# Patient Record
Sex: Male | Born: 1960 | Race: White | Hispanic: No | Marital: Single | Smoking: Never smoker
Health system: Southern US, Community
[De-identification: ages and names within clinical notes are randomized; demographics above are authoritative.]

## PROBLEM LIST (undated history)

## (undated) DIAGNOSIS — D649 Anemia, unspecified: Secondary | ICD-10-CM

## (undated) DIAGNOSIS — K219 Gastro-esophageal reflux disease without esophagitis: Secondary | ICD-10-CM

## (undated) DIAGNOSIS — E7800 Pure hypercholesterolemia, unspecified: Secondary | ICD-10-CM

## (undated) DIAGNOSIS — I4729 Other ventricular tachycardia: Secondary | ICD-10-CM

## (undated) DIAGNOSIS — I10 Essential (primary) hypertension: Secondary | ICD-10-CM

## (undated) DIAGNOSIS — Z860100 Personal history of colon polyps, unspecified: Secondary | ICD-10-CM

## (undated) DIAGNOSIS — I2699 Other pulmonary embolism without acute cor pulmonale: Secondary | ICD-10-CM

## (undated) DIAGNOSIS — K317 Polyp of stomach and duodenum: Secondary | ICD-10-CM

## (undated) HISTORY — DX: Other pulmonary embolism without acute cor pulmonale: I26.99

## (undated) HISTORY — DX: Other ventricular tachycardia: I47.29

## (undated) HISTORY — DX: Polyp of stomach and duodenum: K31.7

## (undated) HISTORY — DX: Anemia, unspecified: D64.9

## (undated) HISTORY — DX: Personal history of colon polyps, unspecified: Z86.0100

---

## 1999-07-04 ENCOUNTER — Emergency Department (HOSPITAL_COMMUNITY): Admission: EM | Admit: 1999-07-04 | Discharge: 1999-07-04 | Payer: Self-pay | Admitting: Emergency Medicine

## 1999-07-04 ENCOUNTER — Encounter: Payer: Self-pay | Admitting: Emergency Medicine

## 2017-08-18 ENCOUNTER — Encounter (HOSPITAL_COMMUNITY): Payer: Self-pay | Admitting: Emergency Medicine

## 2017-08-18 ENCOUNTER — Other Ambulatory Visit: Payer: Self-pay

## 2017-08-18 ENCOUNTER — Encounter (HOSPITAL_COMMUNITY)
Admission: EM | Disposition: A | Discharge status: Home / Self Care | Payer: Self-pay | Source: Home / Self Care | Attending: Emergency Medicine

## 2017-08-18 ENCOUNTER — Telehealth: Payer: Self-pay

## 2017-08-18 ENCOUNTER — Emergency Department (HOSPITAL_COMMUNITY)
Admission: EM | Admit: 2017-08-18 | Discharge: 2017-08-18 | Disposition: A | Discharge status: Home / Self Care | Payer: Self-pay | Attending: Emergency Medicine | Admitting: Emergency Medicine

## 2017-08-18 DIAGNOSIS — Y999 Unspecified external cause status: Secondary | ICD-10-CM | POA: Insufficient documentation

## 2017-08-18 DIAGNOSIS — R1310 Dysphagia, unspecified: Secondary | ICD-10-CM

## 2017-08-18 DIAGNOSIS — K209 Esophagitis, unspecified: Secondary | ICD-10-CM

## 2017-08-18 DIAGNOSIS — Z79899 Other long term (current) drug therapy: Secondary | ICD-10-CM | POA: Insufficient documentation

## 2017-08-18 DIAGNOSIS — K222 Esophageal obstruction: Secondary | ICD-10-CM | POA: Insufficient documentation

## 2017-08-18 DIAGNOSIS — K2970 Gastritis, unspecified, without bleeding: Secondary | ICD-10-CM

## 2017-08-18 DIAGNOSIS — X58XXXA Exposure to other specified factors, initial encounter: Secondary | ICD-10-CM | POA: Insufficient documentation

## 2017-08-18 DIAGNOSIS — Y939 Activity, unspecified: Secondary | ICD-10-CM | POA: Insufficient documentation

## 2017-08-18 DIAGNOSIS — T18128A Food in esophagus causing other injury, initial encounter: Secondary | ICD-10-CM | POA: Insufficient documentation

## 2017-08-18 DIAGNOSIS — K449 Diaphragmatic hernia without obstruction or gangrene: Secondary | ICD-10-CM

## 2017-08-18 DIAGNOSIS — K317 Polyp of stomach and duodenum: Secondary | ICD-10-CM

## 2017-08-18 DIAGNOSIS — I10 Essential (primary) hypertension: Secondary | ICD-10-CM | POA: Insufficient documentation

## 2017-08-18 DIAGNOSIS — Y929 Unspecified place or not applicable: Secondary | ICD-10-CM | POA: Insufficient documentation

## 2017-08-18 HISTORY — DX: Essential (primary) hypertension: I10

## 2017-08-18 HISTORY — DX: Pure hypercholesterolemia, unspecified: E78.00

## 2017-08-18 HISTORY — PX: ESOPHAGOGASTRODUODENOSCOPY: SHX5428

## 2017-08-18 HISTORY — DX: Gastro-esophageal reflux disease without esophagitis: K21.9

## 2017-08-18 SURGERY — EGD (ESOPHAGOGASTRODUODENOSCOPY)
Anesthesia: Moderate Sedation

## 2017-08-18 MED ORDER — STERILE WATER FOR INJECTION IJ SOLN
INTRAMUSCULAR | Status: AC
  Administered 2017-08-18: 0.9 mL
  Filled 2017-08-18: qty 10

## 2017-08-18 MED ORDER — BUTAMBEN-TETRACAINE-BENZOCAINE 2-2-14 % EX AERO
INHALATION_SPRAY | CUTANEOUS | Status: DC | PRN
  Administered 2017-08-18: 1 via TOPICAL

## 2017-08-18 MED ORDER — GLUCAGON HCL RDNA (DIAGNOSTIC) 1 MG IJ SOLR
1.0000 mg | Freq: Once | INTRAMUSCULAR | Status: AC
  Administered 2017-08-18: 1 mg via INTRAVENOUS
  Filled 2017-08-18: qty 1

## 2017-08-18 MED ORDER — DIPHENHYDRAMINE HCL 50 MG/ML IJ SOLN
INTRAMUSCULAR | Status: AC
  Filled 2017-08-18: qty 1

## 2017-08-18 MED ORDER — FENTANYL CITRATE (PF) 100 MCG/2ML IJ SOLN
INTRAMUSCULAR | Status: DC | PRN
  Administered 2017-08-18 (×3): 25 ug via INTRAVENOUS

## 2017-08-18 MED ORDER — LORAZEPAM 2 MG/ML IJ SOLN
1.0000 mg | Freq: Once | INTRAMUSCULAR | Status: AC
  Administered 2017-08-18: 1 mg via INTRAVENOUS
  Filled 2017-08-18: qty 1

## 2017-08-18 MED ORDER — MIDAZOLAM HCL 10 MG/2ML IJ SOLN
INTRAMUSCULAR | Status: DC | PRN
  Administered 2017-08-18: 1 mg via INTRAVENOUS
  Administered 2017-08-18 (×2): 2 mg via INTRAVENOUS

## 2017-08-18 MED ORDER — FENTANYL CITRATE (PF) 100 MCG/2ML IJ SOLN
INTRAMUSCULAR | Status: AC
  Filled 2017-08-18: qty 2

## 2017-08-18 MED ORDER — OMEPRAZOLE 20 MG PO CPDR: 40.0000 mg | DELAYED_RELEASE_CAPSULE | Freq: Every day | ORAL | 0 refills | Status: DC

## 2017-08-18 MED ORDER — METOCLOPRAMIDE HCL 5 MG/ML IJ SOLN
10.0000 mg | Freq: Once | INTRAMUSCULAR | Status: AC
  Administered 2017-08-18: 10 mg via INTRAVENOUS
  Filled 2017-08-18: qty 2

## 2017-08-18 MED ORDER — MIDAZOLAM HCL 5 MG/ML IJ SOLN
INTRAMUSCULAR | Status: AC
  Filled 2017-08-18: qty 2

## 2017-08-18 MED ORDER — PANTOPRAZOLE SODIUM 40 MG IV SOLR
40.0000 mg | Freq: Once | INTRAVENOUS | Status: AC
  Administered 2017-08-18: 40 mg via INTRAVENOUS
  Filled 2017-08-18: qty 40

## 2017-08-18 NOTE — Telephone Encounter (Signed)
Pyrtle, Lajuan Lines, MD  Willia Craze, NP; Greggory Keen, LPN        [name]  the patient is Jeffrey Potter MRN: [id number]  He would likely best be done at [facility] on 1 of my half days for dilation, but also he had several gastric polyps that need to be removed and clipped.  [facility] would be for screening  Likely best to see Nevin Bloodgood in followup and then arrange procedures at [facility]  May want to grab the hospital slots for him now as they fill quickly  Thanks  JMP   Previous Messages    ----- Message -----  From: Willia Craze, NP  Sent: 08/18/2017  9:53 AM  To: Jerene Bears, MD, Greggory Keen, LPN   [name] please get patient a follow up with me in a couple of weeks. He had food impaction and pyrtle did EGD with disimpaction in the ED today. He needs follow up to discuss repeat EGD and also CRC screening. Thanks

## 2017-08-18 NOTE — ED Triage Notes (Signed)
Pt states he has really bad acid reflux and tonight when he went to eat supper around 9pm he was unable to swallow his food and started having vomiting  Pt denies getting choked on any food  Pt states he is not able to swallow his own saliva

## 2017-08-18 NOTE — ED Provider Notes (Signed)
[facility] DEPT Provider Note: Georgena Spurling, MD, FACEP  CSN: [id number] MRN: [id number] ARRIVAL: 08/18/17 at [place]: WA10/WA10   CHIEF COMPLAINT  Dysphagia   HISTORY OF PRESENT ILLNESS  08/18/17 3:32 AM Jeffrey Potter is a 57 y.o. male with a history of GERD currently on omeprazole.  He has a history of esophageal blockages but has never had to have these medically evaluated as they usually pass on their own and their frequency has improved since being on omeprazole.  He ate lunch yesterday without difficulty.  When he went to eat dinner yesterday evening about 9 PM he found he was unable to swallow anything.  Anything he attempts to swallow comes back up including his own saliva.  There is no associated pain.  He has tried drinking fluids without relief.  He is not aware of being choked on a food bolus.  He is not having difficulty breathing.  He has never seen a gastroenterologist.   Past Medical History:  Diagnosis Date  . GERD (gastroesophageal reflux disease)   . High cholesterol   . Hypertension     History reviewed. No pertinent surgical history.  Family History  Problem Relation Age of Onset  . Cancer Father     Social History   Tobacco Use  . Smoking status: Never Smoker  . Smokeless tobacco: Never Used  Substance Use Topics  . Alcohol use: Yes  . Drug use: No    Prior to Admission medications   Medication Sig Start Date End Date Taking? Authorizing Provider  amLODipine (NORVASC) 5 MG tablet Take 5 mg by mouth daily.   Yes [provider]  losartan-hydrochlorothiazide (HYZAAR) 100-12.5 MG tablet Take 1 tablet by mouth daily.   Yes [provider]  lovastatin (MEVACOR) 20 MG tablet Take 40 mg by mouth daily.   Yes [provider]  omeprazole (PRILOSEC OTC) 20 MG tablet Take 20 mg by mouth daily.   Yes [provider]    Allergies Patient has no known allergies.   REVIEW OF SYSTEMS  Negative except as noted here  or in the History of Present Illness.   PHYSICAL EXAMINATION  Initial Vital Signs Blood pressure (!) 179/110, pulse 97, temperature 98.6 F (37 C), temperature source Oral, resp. rate 18, height 5\' 10"  (1.778 m), weight 99.8 kg (220 lb), SpO2 98 %.  Examination General: Well-developed, well-nourished male in no acute distress; appearance consistent with age of record HENT: normocephalic; atraumatic Eyes: pupils equal, round and reactive to light; extraocular muscles intact Neck: supple Heart: regular rate and rhythm Lungs: clear to auscultation bilaterally Abdomen: soft; nondistended; nontender; bowel sounds present Extremities: No deformity; full range of motion; pulses normal Neurologic: Awake, alert and oriented; motor function intact in all extremities and symmetric; no facial droop Skin: Warm and dry Psychiatric: Normal mood and affect   RESULTS  Summary of this visit's results, reviewed by myself:   EKG Interpretation  Date/Time:    Ventricular Rate:    PR Interval:    QRS Duration:   QT Interval:    QTC Calculation:   R Axis:     Text Interpretation:        Laboratory Studies: No results found for this or any previous visit (from the past 24 hour(s)). Imaging Studies: No results found.  ED COURSE  Nursing notes and initial vitals signs, including pulse oximetry, reviewed.  Vitals:   08/18/17 0318 08/18/17 0323 08/18/17 0410 08/18/17 0530  BP: (!) 179/110  Marland Kitchen)  158/93 133/90  Pulse: 97  92 94  Resp: 18  18 18   Temp: 98.6 F (37 C)     TempSrc: Oral     SpO2: 98%  94% 97%  Weight:  99.8 kg (220 lb)    Height:  5\' 10"  (1.778 m)     5:26 AM No change with IV Protonix, glucagon, Reglan and Ativan.  We will repeat glucagon.  5:47 AM No change with second dose of IV glucagon.  5:54 AM [facility] consulted for urgent endoscopy.  They will see him after 7 AM.  PROCEDURES    ED DIAGNOSES     ICD-10-CM   1. Acute esophageal obstruction  K22.2        Shelaine Frie, Jenny Reichmann, MD 08/18/17 [id number]

## 2017-08-18 NOTE — ED Notes (Signed)
Patient given oral fluids at this time. Family at bedside, no acute distress noted. Patient positioned upright in bed, able to maintain fluids. VSS. Call bell within reach, pt conscious and alert/oriented at baseline. Will continue to monitor.

## 2017-08-18 NOTE — Consult Note (Signed)
Referring Provider: ED Primary Care Physician:  Leanna Battles, MD Primary Gastroenterologist:  None unassigned.  Reason for Consultation:    Food impaction    ASSESSMENT AND PLAN:    57 year old male with long-standing GERD, symptoms controlled on PPI over the last few years.  He still has intermittent solid food dysphasia however in EGD now.  Food impaction.  Meat became lodged in his esophagus at dinner last night.  He has been unable to tolerate p.o. since without vomiting.  Normally he is able to vomit up food bolus but has not been able to this time.  -patient needs EGD with possible removal of food bolus. He understands that we would likely not be able to dilate his esophagus with this procedure and that repeat EGD may be necessary at some point in near future.    HPI: Jeffrey Potter is a 57 y.o. male with hx of HTN and GERD. He is in the emergency department with a food impaction.  Patient was eating pot roast last night when a piece of food became lodged in his esophagus.  He was unable to get the meat and has been vomiting.  Emergency department he is unable to tolerate a sip of fluid.  No shortness of breath coughing or chest pain.  No odynophagia.  Patient gives a long-standing history of GERD but since starting a PPI several years ago his symptoms have been nicely controlled.  He still gets occasional intermittent dysphagia but normally is able to vomit the food.  Patient has never had an upper endoscopy.  He is never had to seek treatment for food impaction.  No other GI complaints.     Past Medical History:  Diagnosis Date  . GERD (gastroesophageal reflux disease)   . High cholesterol   . Hypertension     History reviewed. No pertinent surgical history.  Prior to Admission medications   Medication Sig Start Date End Date Taking? Authorizing Provider  amLODipine (NORVASC) 5 MG tablet Take 5 mg by mouth daily.   Yes [provider]  losartan-hydrochlorothiazide  (HYZAAR) 100-12.5 MG tablet Take 1 tablet by mouth daily.   Yes [provider]  lovastatin (MEVACOR) 20 MG tablet Take 40 mg by mouth daily.   Yes [provider]  omeprazole (PRILOSEC OTC) 20 MG tablet Take 20 mg by mouth daily.   Yes [provider]    No current facility-administered medications for this encounter.    Current Outpatient Medications  Medication Sig Dispense Refill  . amLODipine (NORVASC) 5 MG tablet Take 5 mg by mouth daily.    Marland Kitchen losartan-hydrochlorothiazide (HYZAAR) 100-12.5 MG tablet Take 1 tablet by mouth daily.    Marland Kitchen lovastatin (MEVACOR) 20 MG tablet Take 40 mg by mouth daily.    Marland Kitchen omeprazole (PRILOSEC OTC) 20 MG tablet Take 20 mg by mouth daily.      Allergies as of 08/18/2017  . (No Known Allergies)    Family History  Problem Relation Age of Onset  . Cancer Father     Social History   Socioeconomic History  . Marital status: Single    Spouse name: Not on file  . Number of children: Not on file  . Years of education: Not on file  . Highest education level: Not on file  Social Needs  . Financial resource strain: Not on file  . Food insecurity - worry: Not on file  . Food insecurity - inability: Not on file  . Transportation needs -  medical: Not on file  . Transportation needs - non-medical: Not on file  Occupational History  . Not on file  Tobacco Use  . Smoking status: Never Smoker  . Smokeless tobacco: Never Used  Substance and Sexual Activity  . Alcohol use: Yes  . Drug use: No  . Sexual activity: Not on file  Other Topics Concern  . Not on file  Social History Narrative  . Not on file    Review of Systems: All systems reviewed and negative except where noted in HPI.  Physical Exam: Vital signs in last 24 hours: Temp:  [98.6 F (37 C)] 98.6 F (37 C) (03/08 0318) Pulse Rate:  [78-97] 82 (03/08 0912) Resp:  [16-20] 20 (03/08 0912) BP: (133-179)/(89-110) 149/94 (03/08 0912) SpO2:  [93 %-98 %] 96 %  (03/08 0912) Weight:  [220 lb (99.8 kg)] 220 lb (99.8 kg) (03/08 0323)   General:   Alert, well-developed,  White male in NAD Psych:  Pleasant, cooperative. Normal mood and affect. Eyes:  Pupils equal, sclera clear, no icterus.   Conjunctiva pink. Ears:  Normal auditory acuity. Nose:  No deformity, discharge,  or lesions. Neck:  Supple; no masses Lungs:  Clear throughout to auscultation.   No wheezes, crackles, or rhonchi.  Heart:  Regular rate and rhythm; no murmurs, no edema Abdomen:  Soft, non-distended, nontender, BS active, no palp mass    Rectal:  Deferred  Msk:  Symmetrical without gross deformities. . Pulses:  Normal pulses noted. Neurologic:  Alert and  oriented x4;  grossly normal neurologically. Skin:  Intact without significant lesions or rashes..   Intake/Output from previous day: No intake/output data recorded. Intake/Output this shift: No intake/output data recorded.  Lab Results: No results for input(s): WBC, HGB, HCT, PLT in the last 72 hours. BMET No results for input(s): NA, K, CL, CO2, GLUCOSE, BUN, CREATININE, CALCIUM in the last 72 hours. LFT No results for input(s): PROT, ALBUMIN, AST, ALT, ALKPHOS, BILITOT, BILIDIR, IBILI in the last 72 hours. PT/INR No results for input(s): LABPROT, INR in the last 72 hours. Hepatitis Panel No results for input(s): HEPBSAG, HCVAB, HEPAIGM, HEPBIGM in the last 72 hours.    Studies/Results: No results found.   Jeffrey Savoy, NP-C @  08/18/2017, 9:15 AM  Pager number [phone]

## 2017-08-18 NOTE — Telephone Encounter (Addendum)
-----   Message from Jerene Bears, MD sent at 08/18/2017 10:20 AM EST ----- Yes you can split the EGD and colon EGD at [place] can be thereafter at [place]  ----- Message ----- From: Greggory Keen, LPN Sent: 0/0/4599  77:41 AM To: Jerene Bears, MD  How urgent is the EGD? May I split the procedures? At this moment there is one opening for a single case on 10/10/17.

## 2017-08-18 NOTE — ED Notes (Signed)
Bed: WA10 Expected date:  Expected time:  Means of arrival:  Comments: 

## 2017-08-18 NOTE — ED Notes (Signed)
GI at bedside completing procedure.

## 2017-08-18 NOTE — Telephone Encounter (Signed)
Follow up with Jeffrey Potter  At 3:00 pm on 09/01/17. EGD with biopsy and dilation at [place] on 10/10/17 at 10:30 am. He will need instructions at his follow up visit on 09/01/17. Patient had an emergency EGD today. I will send this in a letter. It may have also been printed on his AVS when he was discharge today if

## 2017-08-18 NOTE — Discharge Instructions (Signed)

## 2017-08-18 NOTE — Progress Notes (Signed)
EGD procedure completed with no concerns. Report given at bedside to [facility], [place].

## 2017-08-18 NOTE — Op Note (Signed)
[facility] Patient Name: Jeffrey Potter Procedure Date: 08/18/2017 MRN: [id number] Attending MD: Jerene Bears , MD Date of Birth: [birth year] CSN: [id number] Age: 57 Admit Type: Emergency Department Procedure:                Upper GI endoscopy Indications:              Foreign body in the esophagus Providers:                Lajuan Lines. Hilarie Fredrickson, MD, Carolynn Comment RN, RN, Charolette Child, Technician Referring MD:             Shanon Rosser, MD (ER doctor) Medicines:                Fentanyl 75 micrograms IV, Midazolam 5 mg IV Complications:            No immediate complications. Estimated Blood Loss:     Estimated blood loss: none. Procedure:                Pre-Anesthesia Assessment:                           - Prior to the procedure, a History and Physical                            was performed, and patient medications and                            allergies were reviewed. The patient's tolerance of                            previous anesthesia was also reviewed. The risks                            and benefits of the procedure and the sedation                            options and risks were discussed with the patient.                            All questions were answered, and informed consent                            was obtained. Prior Anticoagulants: The patient has                            taken no previous anticoagulant or antiplatelet                            agents. ASA Grade Assessment: II - A patient with                            mild systemic disease. After reviewing the risks  and benefits, the patient was deemed in                            satisfactory condition to undergo the procedure.                           After obtaining informed consent, the endoscope was                            passed under direct vision. Throughout the                            procedure, the patient's blood pressure,  pulse, and                            oxygen saturations were monitored continuously. The                            was introduced through the mouth, and advanced to                            the second part of duodenum. The upper GI endoscopy                            was accomplished without difficulty. The patient                            tolerated the procedure well. Findings:      Food was found in the lower third of the esophagus. With insufflation       alone the bolus moved spontaneously into the proximal stomach.      Mildly severe esophagitis with no bleeding was found in the lower third       of the esophagus. This is reflux related and secondary to recent       esophageal food impaction.      A benign-appearing, intrinsic mild stenosis (Schatzki's ring) was found       at the gastroesophageal junction. This was traversed.      A 4 cm hiatal hernia was present.      Two 8-10 mm sessile polyps with no bleeding were found in the gastric       body. These appear to be inflammatory in nature. Polypectomy was not       attempted given food impaction.      Mild inflammation characterized by erythema and nodular/polypoid mucosa       in the was found in the gastric antrum.      The examined duodenum was normal. Impression:               - Food was found in the esophagus. Food impaction                            cleared.                           - Mild esophagitis.                           -  Schatzki's ring was found at the gastroesophageal                            junction.                           - 4 cm hiatal hernia.                           - Two gastric body polyps. Resection not attempted.                           - Gastritis with nodular antral mucosa.                           - Normal examined duodenum.                           - No specimens collected. Moderate Sedation:      Moderate (conscious) sedation was administered by the endoscopy nurse       and  supervised by the endoscopist. The following parameters were       monitored: oxygen saturation, heart rate, blood pressure, and response       to care. Total physician intraservice time was 13 minutes. Recommendation:           - Patient has a contact number available for                            emergencies. The signs and symptoms of potential                            delayed complications were discussed with the                            patient. Return to normal activities tomorrow.                            Written discharge instructions were provided to the                            patient.                           - Soft diet until follow-up EGD with dilation.                           - Continue present medications but increase                            omeprazole to 40 mg once daily.                           - Repeat upper endoscopy at appointment to be                            scheduled for dilation and polypectomy.                           -  Return to GI clinic at appointment to be                            scheduled. Procedure Code(s):        --- Professional ---                           [phone], Esophagogastroduodenoscopy, flexible,                            transoral; diagnostic, including collection of                            specimen(s) by brushing or washing, when performed                            (separate procedure)                           99152, Moderate sedation services provided by the                            same physician or other qualified health care                            professional performing the diagnostic or                            therapeutic service that the sedation supports,                            requiring the presence of an independent trained                            observer to assist in the monitoring of the                            patient's level of consciousness and physiological                             status; initial 15 minutes of intraservice time,                            patient age 81 years or older Diagnosis Code(s):        --- Professional ---                           [phone], Food in esophagus causing other injury,                            initial encounter                           K20.9, Esophagitis, unspecified  K22.2, Esophageal obstruction                           K44.9, Diaphragmatic hernia without obstruction or                            gangrene                           K31.7, Polyp of stomach and duodenum                           K29.70, Gastritis, unspecified, without bleeding                           T18.108A, Unspecified foreign body in esophagus                            causing other injury, initial encounter CPT copyright 2016 [employer]. All rights reserved. The codes documented in this report are preliminary and upon coder review may  be revised to meet current compliance requirements. Jerene Bears, MD 08/18/2017 10:34:45 AM This report has been signed electronically. Number of Addenda: 0

## 2017-08-21 ENCOUNTER — Encounter (HOSPITAL_COMMUNITY): Payer: Self-pay | Admitting: Internal Medicine

## 2017-09-01 ENCOUNTER — Ambulatory Visit: Payer: Self-pay | Admitting: Nurse Practitioner

## 2017-09-06 ENCOUNTER — Ambulatory Visit: Payer: Self-pay | Admitting: Nurse Practitioner

## 2017-09-17 ENCOUNTER — Ambulatory Visit (HOSPITAL_COMMUNITY)
Admission: EM | Admit: 2017-09-17 | Discharge: 2017-09-17 | Disposition: A | Discharge status: Home / Self Care | Payer: Self-pay | Attending: Emergency Medicine | Admitting: Emergency Medicine

## 2017-09-17 ENCOUNTER — Encounter (HOSPITAL_COMMUNITY): Payer: Self-pay | Admitting: *Deleted

## 2017-09-17 ENCOUNTER — Encounter (HOSPITAL_COMMUNITY)
Admission: EM | Disposition: A | Discharge status: Home / Self Care | Payer: Self-pay | Source: Home / Self Care | Attending: Emergency Medicine

## 2017-09-17 ENCOUNTER — Other Ambulatory Visit: Payer: Self-pay

## 2017-09-17 DIAGNOSIS — K222 Esophageal obstruction: Secondary | ICD-10-CM | POA: Insufficient documentation

## 2017-09-17 DIAGNOSIS — E7800 Pure hypercholesterolemia, unspecified: Secondary | ICD-10-CM | POA: Insufficient documentation

## 2017-09-17 DIAGNOSIS — K317 Polyp of stomach and duodenum: Secondary | ICD-10-CM | POA: Insufficient documentation

## 2017-09-17 DIAGNOSIS — T18128A Food in esophagus causing other injury, initial encounter: Secondary | ICD-10-CM | POA: Insufficient documentation

## 2017-09-17 DIAGNOSIS — Z79899 Other long term (current) drug therapy: Secondary | ICD-10-CM | POA: Insufficient documentation

## 2017-09-17 DIAGNOSIS — Y92009 Unspecified place in unspecified non-institutional (private) residence as the place of occurrence of the external cause: Secondary | ICD-10-CM | POA: Insufficient documentation

## 2017-09-17 DIAGNOSIS — X58XXXA Exposure to other specified factors, initial encounter: Secondary | ICD-10-CM | POA: Insufficient documentation

## 2017-09-17 DIAGNOSIS — K449 Diaphragmatic hernia without obstruction or gangrene: Secondary | ICD-10-CM | POA: Insufficient documentation

## 2017-09-17 DIAGNOSIS — K219 Gastro-esophageal reflux disease without esophagitis: Secondary | ICD-10-CM | POA: Insufficient documentation

## 2017-09-17 DIAGNOSIS — I10 Essential (primary) hypertension: Secondary | ICD-10-CM | POA: Insufficient documentation

## 2017-09-17 HISTORY — PX: ESOPHAGOGASTRODUODENOSCOPY: SHX5428

## 2017-09-17 SURGERY — EGD (ESOPHAGOGASTRODUODENOSCOPY)
Anesthesia: Moderate Sedation

## 2017-09-17 MED ORDER — FENTANYL CITRATE (PF) 100 MCG/2ML IJ SOLN
INTRAMUSCULAR | Status: AC
  Filled 2017-09-17: qty 2

## 2017-09-17 MED ORDER — MIDAZOLAM HCL 10 MG/2ML IJ SOLN
INTRAMUSCULAR | Status: DC | PRN
  Administered 2017-09-17: 2 mg via INTRAVENOUS
  Administered 2017-09-17 (×4): 1 mg via INTRAVENOUS
  Administered 2017-09-17: 2 mg via INTRAVENOUS

## 2017-09-17 MED ORDER — SODIUM CHLORIDE 0.9 % IV SOLN: INTRAVENOUS | Status: DC

## 2017-09-17 MED ORDER — FENTANYL CITRATE (PF) 100 MCG/2ML IJ SOLN
INTRAMUSCULAR | Status: DC | PRN
  Administered 2017-09-17 (×3): 25 ug via INTRAVENOUS

## 2017-09-17 MED ORDER — DIPHENHYDRAMINE HCL 50 MG/ML IJ SOLN
INTRAMUSCULAR | Status: AC
  Filled 2017-09-17: qty 1

## 2017-09-17 MED ORDER — MIDAZOLAM HCL 5 MG/ML IJ SOLN
INTRAMUSCULAR | Status: AC
  Filled 2017-09-17: qty 2

## 2017-09-17 NOTE — Consult Note (Signed)
Consult for  GI  Reason for Consult: Food impaction Referring Physician: ER  Jeffrey Potter HPI: The patient ate pot roast last evening and this resulted in a food impaction.  He has been unable to tolerate any PO or oral secretions.  Dr. Hilarie Fredrickson performed an EGD on 08/18/2017 emergently for a food impaction.  At that time he also ate pot roast.  Past Medical History:  Diagnosis Date  . GERD (gastroesophageal reflux disease)   . High cholesterol   . Hypertension     Past Surgical History:  Procedure Laterality Date  . ESOPHAGOGASTRODUODENOSCOPY N/A 08/18/2017   Procedure: ESOPHAGOGASTRODUODENOSCOPY (EGD);  Surgeon: Jerene Bears, MD;  Location: [facility];  Service: Gastroenterology;  Laterality: N/A;    Family History  Problem Relation Age of Onset  . Cancer Father     Social History:  reports that he has never smoked. He has never used smokeless tobacco. He reports that he drinks alcohol. He reports that he does not use drugs.  Allergies: No Known Allergies  Medications: Scheduled: Continuous:  No results found for this or any previous visit (from the past 24 hour(s)).   No results found.  ROS:  As stated above in the HPI otherwise negative.  Blood pressure 140/88, pulse 97, temperature 98.2 F (36.8 C), temperature source Oral, resp. rate 20, height 5\' 10"  (1.778 m), weight 99.8 kg (220 lb), SpO2 95 %.    PE: Gen: NAD, Alert and Oriented HEENT:  Jeffrey Potter, EOMI Neck: Supple, no LAD Lungs: CTA Bilaterally CV: RRR without M/G/R ABM: Soft, NTND, +BS Ext: No C/C/E  Assessment/Plan: 1) Food impaction - EGD for disimpaction.  Daltyn Degroat D 09/17/2017, 4:02 PM

## 2017-09-17 NOTE — Discharge Instructions (Addendum)
You were seen here today for obstruction due to a food bolus.  This was removed by gastroenterology in the department.  Please follow with them on an outpatient basis.  Do not drive the rest of the day as you had anesthesia. If you develop worsening or new concerning symptoms you can return to the emergency department for re-evaluation.

## 2017-09-17 NOTE — ED Provider Notes (Signed)
[facility] DEPT Provider Note   CSN: [id number] Arrival date & time: 09/17/17  1342     History   Chief Complaint Chief Complaint  Patient presents with  . Swallowed Foreign Body    Pot Roast    HPI  Jeffrey Potter is a 57 y.o. male with a long history of GERD, recent history of acute esophageal obstruction as well as diagnosis of Schatzki's ring who presents the emergency department today for follow-up foreign body.  Patient states that yesterday evening around 9 PM he was having a late night snack that consisted of pot roast when he felt something get lost in his throat.  Since that time anything that he eats or drinks he vomits back.  He states that this morning he tried to take his medication but had to vomit this back up.  He has had some difficulty handling secretions, occasionally having to spit into a trash can as he is not able to swallow.  Patient states this is similar to his recent visit on 08/18/2017 where he went for upper endoscopy for acute esophageal obstruction.  He denies any associated pain.  He denies any chest pain, difficulty breathing, shortness of breath.  He is not followed by GI on an outpatient basis.   HPI  Past Medical History:  Diagnosis Date  . GERD (gastroesophageal reflux disease)   . High cholesterol   . Hypertension     Patient Active Problem List   Diagnosis Date Noted  . Acute esophageal obstruction   . Schatzki's ring     Past Surgical History:  Procedure Laterality Date  . ESOPHAGOGASTRODUODENOSCOPY N/A 08/18/2017   Procedure: ESOPHAGOGASTRODUODENOSCOPY (EGD);  Surgeon: Jerene Bears, MD;  Location: [facility];  Service: Gastroenterology;  Laterality: N/A;        Home Medications    Prior to Admission medications   Medication Sig Start Date End Date Taking? Authorizing Provider  amLODipine (NORVASC) 5 MG tablet Take 5 mg by mouth daily.   Yes [provider]  losartan-hydrochlorothiazide  (HYZAAR) 100-12.5 MG tablet Take 1 tablet by mouth daily.   Yes [provider]  lovastatin (MEVACOR) 20 MG tablet Take 40 mg by mouth daily.   Yes [provider]  omeprazole (PRILOSEC OTC) 20 MG tablet Take 20 mg by mouth daily.   Yes [provider]  omeprazole (PRILOSEC) 20 MG capsule Take 2 capsules (40 mg total) by mouth daily. Patient not taking: Reported on 09/17/2017 08/18/17   Charlesetta Shanks, MD    Family History Family History  Problem Relation Age of Onset  . Cancer Father     Social History Social History   Tobacco Use  . Smoking status: Never Smoker  . Smokeless tobacco: Never Used  Substance Use Topics  . Alcohol use: Yes  . Drug use: No     Allergies   Patient has no known allergies.   Review of Systems Review of Systems  All other systems reviewed and are negative.    Physical Exam Updated Vital Signs BP 140/88   Pulse 97   Temp 98.2 F (36.8 C) (Oral)   Resp 20   Ht 5\' 10"  (1.778 m)   Wt 99.8 kg (220 lb)   SpO2 95%   BMI 31.57 kg/m   Physical Exam  Constitutional: He appears well-developed and well-nourished.  HENT:  Head: Normocephalic and atraumatic.  Right Ear: External ear normal.  Left Ear: External ear normal.  Nose: Nose normal.  Mouth/Throat: Uvula is midline, oropharynx is clear and moist and mucous membranes are normal. No tonsillar exudate.  Eyes: Pupils are equal, round, and reactive to light. Right eye exhibits no discharge. Left eye exhibits no discharge. No scleral icterus.  Neck: Trachea normal. Neck supple. No spinous process tenderness present. No neck rigidity. Normal range of motion present.  Cardiovascular: Normal rate, regular rhythm and intact distal pulses.  No murmur heard. Pulses:      Radial pulses are 2+ on the right side, and 2+ on the left side.       Dorsalis pedis pulses are 2+ on the right side, and 2+ on the left side.       Posterior tibial pulses are 2+ on the right side, and  2+ on the left side.  No lower extremity swelling or edema. Calves symmetric in size bilaterally.  Pulmonary/Chest: Effort normal and breath sounds normal. He exhibits no tenderness.  No increased work of breathing. No accessory muscle use. Patient is sitting upright, speaking in full sentences without difficulty   Abdominal: Soft. Bowel sounds are normal. There is no tenderness. There is no rebound and no guarding.  Musculoskeletal: He exhibits no edema.  Lymphadenopathy:    He has no cervical adenopathy.  Neurological: He is alert.  Skin: Skin is warm and dry. No rash noted. He is not diaphoretic.  Psychiatric: He has a normal mood and affect.  Nursing note and vitals reviewed.    ED Treatments / Results  Labs (all labs ordered are listed, but only abnormal results are displayed) Labs Reviewed - No data to display  EKG None  Radiology No results found.  Procedures Procedures (including critical care time)  Medications Ordered in ED Medications - No data to display   Initial Impression / Assessment and Plan / ED Course  I have reviewed the triage vital signs and the nursing notes.  Pertinent labs & imaging results that were available during my care of the patient were reviewed by me and considered in my medical decision making (see chart for details).     57 year old male with suspected food bolus after eating pot roast late last night. Dr. Benson Norway of GI to see the patient.  Food was removed by endoscopy.  Patient has been able to tolerate fluids after the procedure.  He was watched for a timely manner to make sure that he had no difficulties after anesthesia.  Is to follow-up with GI on outpatient basis.  He is to continue his home medications.  Strict return precautions discussed.  Patient appears safe for discharge.  Final Clinical Impressions(s) / ED Diagnoses   Final diagnoses:  Esophageal obstruction due to food impaction    ED Discharge Orders    None         Lorelle Gibbs 09/17/17 Agustin Cree, MD 09/17/17 2059

## 2017-09-17 NOTE — Progress Notes (Signed)
2mg  versed and 52mcg fentanyl wasted in ED pyxis, witnessed by Rudi Heap, RN and Valora Corporal., RN.

## 2017-09-17 NOTE — ED Triage Notes (Signed)
Pt ate pot roast last night and piece got stuck. At present unable to tol eating or drinking anything, handling some secretions. This happened last month and had to go to endo

## 2017-09-17 NOTE — Op Note (Signed)
[facility] Patient Name: Jeffrey Potter Procedure Date: 09/17/2017 MRN: [id number] Attending MD: Carol Ada , MD Date of Birth: Jun 16, 1960 CSN: [id number] Age: 57 Admit Type: Outpatient Procedure:                Upper GI endoscopy Indications:              Dysphagia Providers:                Carol Ada, MD, Zenon Mayo, RN, Elspeth Cho                            Tech., Technician Referring MD:              Medicines:                Fentanyl 75 micrograms IV, Midazolam 8 mg IV Complications:            No immediate complications. Estimated Blood Loss:     Estimated blood loss: none. Procedure:                Pre-Anesthesia Assessment:                           - Prior to the procedure, a History and Physical                            was performed, and patient medications and                            allergies were reviewed. The patient's tolerance of                            previous anesthesia was also reviewed. The risks                            and benefits of the procedure and the sedation                            options and risks were discussed with the patient.                            All questions were answered, and informed consent                            was obtained. Prior Anticoagulants: The patient has                            taken no previous anticoagulant or antiplatelet                            agents. ASA Grade Assessment: II - A patient with                            mild systemic disease. After reviewing the risks  and benefits, the patient was deemed in                            satisfactory condition to undergo the procedure.                           - Sedation was administered by an endoscopy nurse.                            The sedation level attained was moderate.                           After obtaining informed consent, the endoscope was                            passed under direct  vision. Throughout the                            procedure, the patient's blood pressure, pulse, and                            oxygen saturations were monitored continuously. The                            EG-[id number] ([id number]) scope was introduced through the                            mouth, and advanced to the second part of duodenum.                            The upper GI endoscopy was technically difficult                            and complex. The patient tolerated the procedure. Scope In: Scope Out: Findings:      Food was found in the lower third of the esophagus. Removal of food was       accomplished.      A moderate Schatzki ring was found at the gastroesophageal junction.      A 3 cm hiatal hernia was present.      Multiple 4 to 8 mm semi-sessile polyps with no bleeding and no stigmata       of recent bleeding were found in the gastric body and in the gastric       antrum.      The examined duodenum was normal.      In the distal esophagus a large meat bolus was encountered. Attempts to       use the endoscope to push the bolus through the esophagus was not       possible as there was too much resistance. Using the Talon graspers the       meat bolus was debulked in size. A Jabier Mutton net was then used to collect the       broken up particles of meat. With the meat bolus smaller it was able to       be pushed through with relative ease. Once the bolus  was cleared the       Schatzki's ring was well visualized in the setting of the hiatal hernia.       No other abnormalities were noted. Impression:               - Food in the lower third of the esophagus. Removal                            was successful.                           - Moderate Schatzki ring.                           - 3 cm hiatal hernia.                           - Multiple gastric polyps.                           - Normal examined duodenum. Moderate Sedation:      Moderate (conscious) sedation was administered  by the endoscopy nurse       and supervised by the endoscopist. The following parameters were       monitored: oxygen saturation, heart rate, blood pressure, and response       to care. Recommendation:           - Patient has a contact number available for                            emergencies. The signs and symptoms of potential                            delayed complications were discussed with the                            patient. Return to normal activities tomorrow.                            Written discharge instructions were provided to the                            patient.                           - Mechanical soft diet.                           - Continue present medications.                           - Follow up with Dr. Hilarie Fredrickson. Procedure Code(s):        --- Professional ---                           [phone], Esophagogastroduodenoscopy, flexible,  transoral; with removal of foreign body(s) Diagnosis Code(s):        --- Professional ---                           W58.099I, Food in esophagus causing other injury,                            initial encounter                           K22.2, Esophageal obstruction                           K44.9, Diaphragmatic hernia without obstruction or                            gangrene                           K31.7, Polyp of stomach and duodenum                           R13.10, Dysphagia, unspecified CPT copyright 2017 American Medical Association. All rights reserved. The codes documented in this report are preliminary and upon coder review may  be revised to meet current compliance requirements. Carol Ada, MD Carol Ada, MD 09/17/2017 6:22:40 PM This report has been signed electronically. Number of Addenda: 0

## 2017-09-18 ENCOUNTER — Telehealth: Payer: Self-pay | Admitting: *Deleted

## 2017-09-18 DIAGNOSIS — K317 Polyp of stomach and duodenum: Secondary | ICD-10-CM

## 2017-09-18 DIAGNOSIS — K222 Esophageal obstruction: Secondary | ICD-10-CM

## 2017-09-18 NOTE — Telephone Encounter (Signed)
-----   Message from Jerene Bears, MD sent at 09/18/2017  9:15 AM EDT ----- Thanks Maceo Pro, He needs outpatient EGD for dilation JMP  ----- Message ----- From: Carol Ada, MD Sent: 09/17/2017   5:35 PM To: Jerene Bears, MD  Ulice Dash,   I am going to scope your patient now for another food impaction.  His sister wanted me to tell you that she was diagnosed with EoE and Celiac disease.  I did not see any gross evidence of EoE with your prior EGD.

## 2017-09-18 NOTE — Telephone Encounter (Signed)
It appears Jeffrey Potter is already scheduled for endoscopy at [facility] (needs dilation but one a previous procedure, had several gastric polyps that needed to be removed and clipped).   I have left a voicemail for patient to call back. Patient will also need colonoscopy in the near future as well but we can defer at this time if needed.

## 2017-09-19 NOTE — Telephone Encounter (Signed)
Left voicemail for patient to call back. 

## 2017-09-20 ENCOUNTER — Other Ambulatory Visit: Payer: Self-pay | Admitting: Internal Medicine

## 2017-09-20 DIAGNOSIS — K222 Esophageal obstruction: Secondary | ICD-10-CM

## 2017-09-20 DIAGNOSIS — K317 Polyp of stomach and duodenum: Secondary | ICD-10-CM

## 2017-09-20 NOTE — Telephone Encounter (Signed)
Patient returning Dottie's call.

## 2017-09-20 NOTE — Telephone Encounter (Signed)
I have spoken to patient to advise that he is scheduled for endoscopy with dilation and gastric clipping at [facility] endoscopy on 10/10/17 at 1030 am with Dr Hilarie Fredrickson. I have advised that he will need to arrive at 900 am 10/10/17 to [facility] admissions and be clear liquids until 4 hours prior to test at which time he will be NPO. Advised all meds should be taken before 630 am. Also advised that patient should have care partner 22 or older to accompany him before, during and after procedure since he will be sedated for test. He verbalizes understanding of time/date/location/prep and need for care partner for procedure. In addition, I have advised patient that he will be due for colonoscopy in the near future for screening once we get endoscopy with dilation completed to which he is agreeable.

## 2017-09-28 ENCOUNTER — Encounter (HOSPITAL_COMMUNITY): Payer: Self-pay | Admitting: Emergency Medicine

## 2017-09-28 ENCOUNTER — Other Ambulatory Visit: Payer: Self-pay

## 2017-10-10 ENCOUNTER — Ambulatory Visit (HOSPITAL_COMMUNITY): Payer: Self-pay | Admitting: Anesthesiology

## 2017-10-10 ENCOUNTER — Encounter (HOSPITAL_COMMUNITY)
Admission: RE | Disposition: A | Discharge status: Home / Self Care | Payer: Self-pay | Source: Ambulatory Visit | Attending: Internal Medicine

## 2017-10-10 ENCOUNTER — Ambulatory Visit (HOSPITAL_COMMUNITY)
Admission: RE | Admit: 2017-10-10 | Discharge: 2017-10-10 | Disposition: A | Discharge status: Home / Self Care | Payer: Self-pay | Source: Ambulatory Visit | Attending: Internal Medicine | Admitting: Internal Medicine

## 2017-10-10 ENCOUNTER — Other Ambulatory Visit: Payer: Self-pay

## 2017-10-10 ENCOUNTER — Encounter (HOSPITAL_COMMUNITY): Payer: Self-pay

## 2017-10-10 DIAGNOSIS — K3189 Other diseases of stomach and duodenum: Secondary | ICD-10-CM

## 2017-10-10 DIAGNOSIS — K2970 Gastritis, unspecified, without bleeding: Secondary | ICD-10-CM

## 2017-10-10 DIAGNOSIS — I10 Essential (primary) hypertension: Secondary | ICD-10-CM | POA: Insufficient documentation

## 2017-10-10 DIAGNOSIS — Z79899 Other long term (current) drug therapy: Secondary | ICD-10-CM | POA: Insufficient documentation

## 2017-10-10 DIAGNOSIS — T18128A Food in esophagus causing other injury, initial encounter: Secondary | ICD-10-CM

## 2017-10-10 DIAGNOSIS — K317 Polyp of stomach and duodenum: Secondary | ICD-10-CM

## 2017-10-10 DIAGNOSIS — Z809 Family history of malignant neoplasm, unspecified: Secondary | ICD-10-CM | POA: Insufficient documentation

## 2017-10-10 DIAGNOSIS — Q399 Congenital malformation of esophagus, unspecified: Secondary | ICD-10-CM

## 2017-10-10 DIAGNOSIS — K449 Diaphragmatic hernia without obstruction or gangrene: Secondary | ICD-10-CM | POA: Insufficient documentation

## 2017-10-10 DIAGNOSIS — K259 Gastric ulcer, unspecified as acute or chronic, without hemorrhage or perforation: Secondary | ICD-10-CM | POA: Insufficient documentation

## 2017-10-10 DIAGNOSIS — E7800 Pure hypercholesterolemia, unspecified: Secondary | ICD-10-CM | POA: Insufficient documentation

## 2017-10-10 DIAGNOSIS — K219 Gastro-esophageal reflux disease without esophagitis: Secondary | ICD-10-CM | POA: Insufficient documentation

## 2017-10-10 DIAGNOSIS — K222 Esophageal obstruction: Secondary | ICD-10-CM | POA: Insufficient documentation

## 2017-10-10 DIAGNOSIS — W44F3XA Food entering into or through a natural orifice, initial encounter: Secondary | ICD-10-CM

## 2017-10-10 DIAGNOSIS — K2960 Other gastritis without bleeding: Secondary | ICD-10-CM | POA: Insufficient documentation

## 2017-10-10 DIAGNOSIS — R1319 Other dysphagia: Secondary | ICD-10-CM

## 2017-10-10 DIAGNOSIS — R1310 Dysphagia, unspecified: Secondary | ICD-10-CM

## 2017-10-10 HISTORY — PX: ESOPHAGOGASTRODUODENOSCOPY (EGD) WITH PROPOFOL: SHX5813

## 2017-10-10 HISTORY — PX: BALLOON DILATION: SHX5330

## 2017-10-10 SURGERY — ESOPHAGOGASTRODUODENOSCOPY (EGD) WITH PROPOFOL
Anesthesia: Monitor Anesthesia Care

## 2017-10-10 MED ORDER — PROPOFOL 10 MG/ML IV BOLUS
INTRAVENOUS | Status: AC
Start: 2017-10-10 — End: 2017-10-10
  Filled 2017-10-10: qty 20

## 2017-10-10 MED ORDER — PROPOFOL 10 MG/ML IV BOLUS
INTRAVENOUS | Status: AC
  Filled 2017-10-10: qty 20

## 2017-10-10 MED ORDER — LIDOCAINE 2% (20 MG/ML) 5 ML SYRINGE
INTRAMUSCULAR | Status: DC | PRN
  Administered 2017-10-10: 100 mg via INTRAVENOUS

## 2017-10-10 MED ORDER — PROPOFOL 10 MG/ML IV BOLUS
INTRAVENOUS | Status: AC
  Filled 2017-10-10: qty 40

## 2017-10-10 MED ORDER — PROPOFOL 500 MG/50ML IV EMUL
INTRAVENOUS | Status: DC | PRN
  Administered 2017-10-10: 120 ug/kg/min via INTRAVENOUS

## 2017-10-10 MED ORDER — LACTATED RINGERS IV SOLN
INTRAVENOUS | Status: DC | PRN
  Administered 2017-10-10: 11:00:00 via INTRAVENOUS

## 2017-10-10 MED ORDER — PROPOFOL 10 MG/ML IV BOLUS
INTRAVENOUS | Status: DC | PRN
  Administered 2017-10-10: 20 mg via INTRAVENOUS
  Administered 2017-10-10: 40 mg via INTRAVENOUS
  Administered 2017-10-10 (×4): 20 mg via INTRAVENOUS

## 2017-10-10 SURGICAL SUPPLY — 14 items

## 2017-10-10 NOTE — Anesthesia Postprocedure Evaluation (Signed)
Anesthesia Post Note  Patient: KESTER STIMPSON  Procedure(s) Performed: ESOPHAGOGASTRODUODENOSCOPY (EGD) WITH PROPOFOL (N/A ) BALLOON DILATION (N/A )     Patient location during evaluation: PACU Anesthesia Type: MAC Level of consciousness: awake and alert Pain management: pain level controlled Vital Signs Assessment: post-procedure vital signs reviewed and stable Respiratory status: spontaneous breathing, nonlabored ventilation and respiratory function stable Cardiovascular status: stable and blood pressure returned to baseline Postop Assessment: no apparent nausea or vomiting Anesthetic complications: no    Last Vitals:  Vitals:   10/10/17 1149 10/10/17 1200  BP: (!) 135/91 (!) 150/92  Pulse: 61 (!) 56  Resp: 15 17  Temp:    SpO2: 100% 97%    Last Pain:  Vitals:   10/10/17 1149  TempSrc: Oral  PainSc:                  Elam Ellis,W. EDMOND

## 2017-10-10 NOTE — Op Note (Signed)
[facility] Patient Name: Jeffrey Potter Procedure Date: 10/10/2017 MRN: [id number] Attending MD: Jerene Bears , MD Date of Birth: May 11, 1961 CSN: [id number] Age: 57 Admit Type: Inpatient Procedure:                Upper GI endoscopy Indications:              Dysphagia, Gastro-esophageal reflux disease,                            history of esophageal food impaction in March 2019                            and April 2019, history of gastric polyps seen at                            recent EGD for food impaction, Providers:                Lajuan Lines. Hilarie Fredrickson, MD, Cleda Daub, RN, Alan Mulder, Technician Referring MD:             Ermalene Searing. Philip Aspen MD, MD Medicines:                Monitored Anesthesia Care Complications:            No immediate complications. Estimated Blood Loss:     Estimated blood loss was minimal. Procedure:                Pre-Anesthesia Assessment:                           - Prior to the procedure, a History and Physical                            was performed, and patient medications and                            allergies were reviewed. The patient's tolerance of                            previous anesthesia was also reviewed. The risks                            and benefits of the procedure and the sedation                            options and risks were discussed with the patient.                            All questions were answered, and informed consent                            was obtained. Prior Anticoagulants: The patient has  taken no previous anticoagulant or antiplatelet                            agents. ASA Grade Assessment: II - A patient with                            mild systemic disease. After reviewing the risks                            and benefits, the patient was deemed in                            satisfactory condition to undergo the procedure.         After obtaining informed consent, the endoscope was                            passed under direct vision. Throughout the                            procedure, the patient's blood pressure, pulse, and                            oxygen saturations were monitored continuously. The                            was introduced through the mouth, and advanced to                            the second part of duodenum. The upper GI endoscopy                            was accomplished without difficulty. The patient                            tolerated the procedure well. Scope In: Scope Out: Findings:      The distal esophagus was mildly tortuous.      A low-grade of narrowing Schatzki ring was found at the gastroesophageal       junction (38 cm from incisors). A TTS dilator was passed through the       scope. Dilation with a 12-13.5-15 mm balloon dilator was performed to 15       mm. The dilation site was examined and showed moderate improvement in       luminal narrowing.      A 3 cm hiatal hernia was present.      Two 10 mm semi-sessile polyps with no bleeding and stigmata of recent       bleeding (adherent heme) were found in the distal gastric body. These       polyps were removed with a hot snare. Resection and retrieval were       complete. To prevent bleeding after the polypectomy, two hemostatic       clips were successfully placed at each polypectomy base. There was no       bleeding during, or at the end, of the polypectomies.  Multiple, additional, small sessile polyps (2-5 mm) with no bleeding and       no stigmata of recent bleeding were found in the gastric fundus and in       the gastric body. These polyps are benign-appearing.      Moderate inflammation characterized by erythema and mucosal nodularity       was found in the gastric antrum. Multiple biopsies were taken with a       cold forceps for histology and Helicobacter pylori testing from the       antrum and  incisura.      The examined duodenum was normal. Impression:               - Low-grade of narrowing Schatzki ring. Dilated to                            15 mm with good result.                           - 3 cm hiatal hernia.                           - Two 1 cm gastric polyps with adherent heme.                            Resected and retrieved. Clips were placed.                           - Multiple small gastric polyps.                           - Nodular gastritis. Biopsied.                           - Normal examined duodenum. Moderate Sedation:      N/A Recommendation:           - Patient has a contact number available for                            emergencies. The signs and symptoms of potential                            delayed complications were discussed with the                            patient. Return to normal activities tomorrow.                            Written discharge instructions were provided to the                            patient.                           - Post-dilation diet for 2 hours, then slowly  advance diet as tolerated to soft diet. Chew food                            extremely well and avoid eating fast.                           - Continue present medications including omeprazole                            40 mg once daily.                           - No aspirin, ibuprofen, naproxen, or other                            non-steroidal anti-inflammatory drugs for at least                            4 weeks after polyp removal.                           - Await pathology results.                           - Repeat upper endoscopy for surveillance based on                            pathology results and as needed for repeat dilation. Procedure Code(s):        --- Professional ---                           [phone], Esophagogastroduodenoscopy, flexible,                            transoral; with removal of tumor(s), polyp(s),  or                            other lesion(s) by snare technique                           43249, Esophagogastroduodenoscopy, flexible,                            transoral; with transendoscopic balloon dilation of                            esophagus (less than 30 mm diameter)                           43239, 59, Esophagogastroduodenoscopy, flexible,                            transoral; with biopsy, single or multiple Diagnosis Code(s):        --- Professional ---  Q39.9, Congenital malformation of esophagus,                            unspecified                           K22.2, Esophageal obstruction                           K44.9, Diaphragmatic hernia without obstruction or                            gangrene                           K31.7, Polyp of stomach and duodenum                           K29.70, Gastritis, unspecified, without bleeding                           R13.10, Dysphagia, unspecified                           K21.9, Gastro-esophageal reflux disease without                            esophagitis CPT copyright 2017 American Medical Association. All rights reserved. The codes documented in this report are preliminary and upon coder review may  be revised to meet current compliance requirements. Jerene Bears, MD 10/10/2017 11:52:35 AM This report has been signed electronically. Number of Addenda: 0

## 2017-10-10 NOTE — H&P (Signed)
  HPI: Jeffrey Potter is a 57 year old male with a history of GERD, Schatzki's ring with diffuse esophageal food impactions in the last 6 weeks who presents for outpatient endoscopy for dilation.  He also has a history of hypertension and hypercholesterolemia.  Since the initial endoscopy he has been taking omeprazole daily.  This has led to resolution of his reflux and GERD symptoms.  He does not have frequent dysphagia though he has been trying to avoid meats and breads waiting on this dilation.  Two 8 to 10 mm polyps were found in the gastric body but not sampled or removed due to acute food impaction  Past Medical History:  Diagnosis Date  . GERD (gastroesophageal reflux disease)   . High cholesterol   . Hypertension     Past Surgical History:  Procedure Laterality Date  . ESOPHAGOGASTRODUODENOSCOPY N/A 08/18/2017   Procedure: ESOPHAGOGASTRODUODENOSCOPY (EGD);  Surgeon: Jerene Bears, MD;  Location: [facility];  Service: Gastroenterology;  Laterality: N/A;  . ESOPHAGOGASTRODUODENOSCOPY N/A 09/17/2017   Procedure: ESOPHAGOGASTRODUODENOSCOPY (EGD);  Surgeon: Carol Ada, MD;  Location: [facility];  Service: Endoscopy;  Laterality: N/A;   Meds Amlodipine 5 mg Losartan hydrochlorothiazide Lovastatin Omeprazole 40 mg daily  No Known Allergies  Family History  Problem Relation Age of Onset  . Cancer Father     Social History   Tobacco Use  . Smoking status: Never Smoker  . Smokeless tobacco: Never Used  Substance Use Topics  . Alcohol use: Yes    Comment: occasionally  . Drug use: No    ROS: As per history of present illness, otherwise negative  BP (!) 161/105   Pulse 70   Temp 98.2 F (36.8 C) (Oral)   Resp 15   SpO2 97%  Gen: awake, alert, NAD HEENT: anicteric, op clear CV: RRR, no mrg Pulm: CTA b/l Abd: soft, NT/ND, +BS throughout Ext: no c/c/e Neuro: nonfocal  ASSESSMENT/PLAN: 57 year old male with a history of GERD, Schatzki's ring with diffuse  esophageal food impactions in the last 6 weeks who presents for outpatient endoscopy for dilation.    1. GERD/ Schatzki's ring/recent food impactions --upper endoscopy today for EGD with dilation of known Schatzki's ring.  This is led to 2 food impactions in the last 2 months.  GERD symptoms have resolved with daily omeprazole.  The nature of the procedure including dilation and polypectomy, as well as the risks, benefits, and alternatives were carefully and thoroughly reviewed with the patient. Ample time for discussion and questions allowed. The patient understood, was satisfied, and agreed to proceed.    2.  Gastric polyps --they appeared inflammatory versus hyperplastic and near 1 cm.  We discussed that these polyps can cause bleeding when they are large or inflamed.  Anticipate polypectomy today but will examine these polyps first.

## 2017-10-10 NOTE — Anesthesia Preprocedure Evaluation (Addendum)
Anesthesia Evaluation  Patient identified by MRN, date of birth, ID band Patient awake    Reviewed: Allergy & Precautions, H&P , NPO status , Patient's Chart, lab work & pertinent test results  Airway Mallampati: III  TM Distance: >3 FB Neck ROM: Full    Dental no notable dental hx. (+) Teeth Intact, Dental Advisory Given   Pulmonary neg pulmonary ROS,    Pulmonary exam normal breath sounds clear to auscultation       Cardiovascular hypertension, Pt. on medications  Rhythm:Regular Rate:Normal     Neuro/Psych negative neurological ROS  negative psych ROS   GI/Hepatic Neg liver ROS, GERD  Medicated and Controlled,  Endo/Other  negative endocrine ROS  Renal/GU negative Renal ROS  negative genitourinary   Musculoskeletal   Abdominal   Peds  Hematology negative hematology ROS (+)   Anesthesia Other Findings   Reproductive/Obstetrics negative OB ROS                            Anesthesia Physical Anesthesia Plan  ASA: II  Anesthesia Plan: MAC   Post-op Pain Management:    Induction: Intravenous  PONV Risk Score and Plan: 1 and Propofol infusion  Airway Management Planned: Nasal Cannula  Additional Equipment:   Intra-op Plan:   Post-operative Plan:   Informed Consent: I have reviewed the patients History and Physical, chart, labs and discussed the procedure including the risks, benefits and alternatives for the proposed anesthesia with the patient or authorized representative who has indicated his/her understanding and acceptance.   Dental advisory given  Plan Discussed with: CRNA  Anesthesia Plan Comments:         Anesthesia Quick Evaluation

## 2017-10-10 NOTE — Transfer of Care (Signed)
Immediate Anesthesia Transfer of Care Note  Patient: Jeffrey Potter  Procedure(s) Performed: ESOPHAGOGASTRODUODENOSCOPY (EGD) WITH PROPOFOL (N/A ) BALLOON DILATION (N/A )  Patient Location: Endoscopy Unit  Anesthesia Type:MAC  Level of Consciousness: awake and alert   Airway & Oxygen Therapy: Patient Spontanous Breathing and Patient connected to nasal cannula oxygen  Post-op Assessment: Report given to RN and Post -op Vital signs reviewed and stable  Post vital signs: Reviewed and stable  Last Vitals:  Vitals Value Taken Time  BP    Temp    Pulse    Resp    SpO2      Last Pain:  Vitals:   10/10/17 0921  TempSrc: Oral  PainSc: 0-No pain         Complications: No apparent anesthesia complications

## 2017-10-10 NOTE — Discharge Instructions (Signed)
Esophagogastroduodenoscopy, Care After °Refer to this sheet in the next few weeks. These instructions provide you with information about caring for yourself after your procedure. Your health care provider may also give you more specific instructions. Your treatment has been planned according to current medical practices, but problems sometimes occur. Call your health care provider if you have any problems or questions after your procedure. °What can I expect after the procedure? °After the procedure, it is common to have: °· A sore throat. °· Nausea. °· Bloating. °· Dizziness. °· Fatigue. ° °Follow these instructions at home: °· Do not eat or drink anything until the numbing medicine (local anesthetic) has worn off and your gag reflex has returned. You will know that the local anesthetic has worn off when you can swallow comfortably. °· Do not drive for 24 hours if you received a medicine to help you relax (sedative). °· If your health care provider took a tissue sample for testing during the procedure, make sure to get your test results. This is your responsibility. Ask your health care provider or the department performing the test when your results will be ready. °· Keep all follow-up visits as told by your health care provider. This is important. °Contact a health care provider if: °· You cannot stop coughing. °· You are not urinating. °· You are urinating less than usual. °Get help right away if: °· You have trouble swallowing. °· You cannot eat or drink. °· You have throat or chest pain that gets worse. °· You are dizzy or light-headed. °· You faint. °· You have nausea or vomiting. °· You have chills. °· You have a fever. °· You have severe abdominal pain. °· You have black, tarry, or bloody stools. °This information is not intended to replace advice given to you by your health care provider. Make sure you discuss any questions you have with your health care provider. °Document Released: 05/16/2012 Document  Revised: 11/05/2015 Document Reviewed: 04/23/2015 °Elsevier Interactive Patient Education © 2018 [employer] ° °

## 2017-10-12 ENCOUNTER — Encounter: Payer: Self-pay | Admitting: Internal Medicine

## 2018-02-10 ENCOUNTER — Encounter (HOSPITAL_COMMUNITY): Payer: Self-pay | Admitting: Emergency Medicine

## 2018-02-10 ENCOUNTER — Other Ambulatory Visit: Payer: Self-pay

## 2018-02-10 ENCOUNTER — Emergency Department (HOSPITAL_COMMUNITY)
Admission: EM | Admit: 2018-02-10 | Discharge: 2018-02-11 | Disposition: A | Discharge status: Home / Self Care | Payer: Self-pay | Attending: Emergency Medicine | Admitting: Emergency Medicine

## 2018-02-10 DIAGNOSIS — T18128A Food in esophagus causing other injury, initial encounter: Secondary | ICD-10-CM | POA: Insufficient documentation

## 2018-02-10 DIAGNOSIS — Y939 Activity, unspecified: Secondary | ICD-10-CM | POA: Insufficient documentation

## 2018-02-10 DIAGNOSIS — Y998 Other external cause status: Secondary | ICD-10-CM | POA: Insufficient documentation

## 2018-02-10 DIAGNOSIS — I10 Essential (primary) hypertension: Secondary | ICD-10-CM | POA: Insufficient documentation

## 2018-02-10 DIAGNOSIS — Y929 Unspecified place or not applicable: Secondary | ICD-10-CM | POA: Insufficient documentation

## 2018-02-10 DIAGNOSIS — E7800 Pure hypercholesterolemia, unspecified: Secondary | ICD-10-CM | POA: Insufficient documentation

## 2018-02-10 DIAGNOSIS — Z79899 Other long term (current) drug therapy: Secondary | ICD-10-CM | POA: Insufficient documentation

## 2018-02-10 DIAGNOSIS — Y33XXXA Other specified events, undetermined intent, initial encounter: Secondary | ICD-10-CM | POA: Insufficient documentation

## 2018-02-10 NOTE — ED Triage Notes (Signed)
Pt reports eating pork around 2100 and then felt it get stuck in throat. Pt reports prior history of food bolus with having to have it removed by ENT. Pt reports drinking coca-cola without any relief. Pt reports having to spit any sputum.

## 2018-02-11 ENCOUNTER — Encounter (HOSPITAL_COMMUNITY)
Admission: EM | Disposition: A | Discharge status: Home / Self Care | Payer: Self-pay | Source: Home / Self Care | Attending: Emergency Medicine

## 2018-02-11 ENCOUNTER — Emergency Department (HOSPITAL_COMMUNITY): Payer: Self-pay

## 2018-02-11 SURGERY — ESOPHAGOGASTRODUODENOSCOPY (EGD) WITH PROPOFOL
Anesthesia: Monitor Anesthesia Care

## 2018-02-11 MED ORDER — GLUCAGON HCL RDNA (DIAGNOSTIC) 1 MG IJ SOLR
1.0000 mg | Freq: Once | INTRAMUSCULAR | Status: AC
  Administered 2018-02-11: 1 mg via INTRAVENOUS
  Filled 2018-02-11: qty 1

## 2018-02-11 MED ORDER — GI COCKTAIL ~~LOC~~
30.0000 mL | Freq: Once | ORAL | Status: AC
  Administered 2018-02-11: 30 mL via ORAL
  Filled 2018-02-11: qty 30

## 2018-02-11 MED ORDER — LORAZEPAM 2 MG/ML IJ SOLN
0.5000 mg | Freq: Once | INTRAMUSCULAR | Status: AC
  Administered 2018-02-11: 0.5 mg via INTRAVENOUS
  Filled 2018-02-11: qty 1

## 2018-02-11 NOTE — ED Notes (Signed)
Patient given water, per PA request.

## 2018-02-11 NOTE — Progress Notes (Signed)
Endoscopy team called in for patient having food bolus, went to pick up patient to bring him to endoscopy suite and patient stated the food already went down and he felt better. Called MD Therisa Doyne and she confirmed with patient and with PA in ED over phone the patient no longer needed the procedure. Cancelling case.

## 2018-02-11 NOTE — ED Provider Notes (Signed)
[facility] DEPT Provider Note   CSN: [id number] Arrival date & time: 02/10/18  2300     History   Chief Complaint Chief Complaint  Patient presents with  . food bolus    HPI Jeffrey Potter is a 57 y.o. male.  HPI 57 year old male history of GERD, Schatzki's ring with diffuse esophageal food impactions presents to the ED for evaluation of food impaction.  Approximately 930 this evening patient was eating a piece of pork when he thought to get stuck in his throat and chest.  Patient states he has been spitting up his secretions.  Inability to swallow secretions or p.o. fluids.  States he feels it in the middle of his chest.  He has have a history of food impactions that have required endoscopy for dilation.  Patient denies any difficulty breathing.  Denies any nausea or vomiting.  Denies any other associated symptoms.  He has not tried anything for his symptoms prior to arrival.  Pt denies any fever, chill, ha, vision changes, lightheadedness, dizziness, congestion, neck pain, cp, sob, cough, abd pain, n/v/d, urinary symptoms, change in bowel habits, melena, hematochezia, lower extremity paresthesias.  Past Medical History:  Diagnosis Date  . GERD (gastroesophageal reflux disease)   . High cholesterol   . Hypertension     Patient Active Problem List   Diagnosis Date Noted  . Gastric polyps   . Gastritis without bleeding   . Nodularity of gastric antrum   . Esophageal dysphagia   . Food impaction of esophagus   . Acute esophageal obstruction   . Schatzki's ring     Past Surgical History:  Procedure Laterality Date  . BALLOON DILATION N/A 10/10/2017   Procedure: BALLOON DILATION;  Surgeon: Jerene Bears, MD;  Location: [facility];  Service: Gastroenterology;  Laterality: N/A;  . ESOPHAGOGASTRODUODENOSCOPY N/A 08/18/2017   Procedure: ESOPHAGOGASTRODUODENOSCOPY (EGD);  Surgeon: Jerene Bears, MD;  Location: [facility];  Service:  Gastroenterology;  Laterality: N/A;  . ESOPHAGOGASTRODUODENOSCOPY N/A 09/17/2017   Procedure: ESOPHAGOGASTRODUODENOSCOPY (EGD);  Surgeon: Carol Ada, MD;  Location: [facility];  Service: Endoscopy;  Laterality: N/A;  . ESOPHAGOGASTRODUODENOSCOPY (EGD) WITH PROPOFOL N/A 10/10/2017   Procedure: ESOPHAGOGASTRODUODENOSCOPY (EGD) WITH PROPOFOL;  Surgeon: Jerene Bears, MD;  Location: [facility];  Service: Gastroenterology;  Laterality: N/A;  with biopsy of gastric polyps        Home Medications    Prior to Admission medications   Medication Sig Start Date End Date Taking? Authorizing Provider  amLODipine (NORVASC) 5 MG tablet Take 5 mg by mouth daily.   Yes [provider]  losartan-hydrochlorothiazide (HYZAAR) 100-12.5 MG tablet Take 1 tablet by mouth daily.   Yes [provider]  lovastatin (MEVACOR) 20 MG tablet Take 40 mg by mouth daily.   Yes [provider]  omeprazole (PRILOSEC) 20 MG capsule Take 2 capsules (40 mg total) by mouth daily. Patient taking differently: Take 20 mg by mouth daily.  08/18/17  Yes Charlesetta Shanks, MD    Family History Family History  Problem Relation Age of Onset  . Cancer Father     Social History Social History   Tobacco Use  . Smoking status: Never Smoker  . Smokeless tobacco: Never Used  Substance Use Topics  . Alcohol use: Yes    Comment: occasionally  . Drug use: No     Allergies   Patient has no known allergies.   Review of Systems Review of Systems   Physical Exam Updated Vital  Signs BP (!) 162/107 (BP Location: Left Arm)   Pulse 76   Temp 98.5 F (36.9 C) (Oral)   Resp 16   Ht 5\' 10"  (1.778 m)   Wt 104.3 kg   SpO2 95%   BMI 33.00 kg/m   Physical Exam  Constitutional: He appears well-developed and well-nourished. No distress.  HENT:  Head: Normocephalic and atraumatic.  Oropharynx is clear.  She is speaking complete sentences.  He has spitting up his secretions.  Eyes: Right eye exhibits  no discharge. Left eye exhibits no discharge. No scleral icterus.  Neck: Normal range of motion. Neck supple.  Cardiovascular: Normal rate, regular rhythm and normal heart sounds.  Pulmonary/Chest: Effort normal and breath sounds normal. No stridor. No respiratory distress. He has no wheezes. He has no rales. He exhibits no tenderness.  No respiratory distress noted.  Musculoskeletal: Normal range of motion.  Neurological: He is alert.  Skin: Skin is warm and dry. Capillary refill takes less than 2 seconds. No pallor.  Psychiatric: His behavior is normal. Judgment and thought content normal.  Nursing note and vitals reviewed.    ED Treatments / Results  Labs (all labs ordered are listed, but only abnormal results are displayed) Labs Reviewed - No data to display  EKG None  Radiology Dg Chest 2 View  Result Date: 02/11/2018 CLINICAL DATA:  Patient with food infection. EXAM: CHEST - 2 VIEW COMPARISON:  None. FINDINGS: Cardiac contours upper limits of normal. Low lung volumes. Left basilar atelectasis. No pleural effusion or pneumothorax. Thoracic spine degenerative changes. IMPRESSION: No acute cardiopulmonary process. Electronically Signed   By: Lovey Newcomer M.D.   On: 02/11/2018 01:36    Procedures Procedures (including critical care time)  Medications Ordered in ED Medications  glucagon (human recombinant) (GLUCAGEN) injection 1 mg (1 mg Intravenous Given 02/11/18 0033)  LORazepam (ATIVAN) injection 0.5 mg (0.5 mg Intravenous Given 02/11/18 0033)  gi cocktail (Maalox,Lidocaine,Donnatal) (30 mLs Oral Given 02/11/18 0123)     Initial Impression / Assessment and Plan / ED Course  I have reviewed the triage vital signs and the nursing notes.  Pertinent labs & imaging results that were available during my care of the patient were reviewed by me and considered in my medical decision making (see chart for details).     Patient presents to the ED for evaluation of food impaction with  history of same requiring endoscopy.  Denies history of Schatzki's rings that have required dilation in the past.  Patient unable to manage secretions at this time.  Will provide glucagon and Ativan.  This was given with some improvement of his symptoms.  Patient was given p.o. Coke and water.  After about 1 hour patient states that he feels like he is coughing things back up.  Chest x-ray performed that was reassuring.  I spoke with Dr. Therisa Doyne with GI concerning patient.  She will call the endoscopy team in for EGD.  Was notified by the endoscopy team that patient states that his food bolus has passed.  When I went to evaluate patient he is sitting in the room swallowing his secretions.  States he is drinking several Coke and cups of water without any difficulties.  He has not had the discomfort in his throat just anymore.  He wants to be discharged home at this time.  Patient remains hemodynamic stable.  No signs of respiratory distress or airway compromise.  Pt is hemodynamically stable, in NAD, & able to ambulate in the ED. Evaluation  does not show pathology that would require ongoing emergent intervention or inpatient treatment. I explained the diagnosis to the patient. Pain has been managed & has no complaints prior to dc. Pt is comfortable with above plan and is stable for discharge at this time. All questions were answered prior to disposition. Strict return precautions for f/u to the ED were discussed. Encouraged follow up with PCP.   Final Clinical Impressions(s) / ED Diagnoses   Final diagnoses:  Food impaction of esophagus, initial encounter    ED Discharge Orders    None       Doristine Devoid, PA-C 02/11/18 0310    Orpah Greek, MD 02/11/18 [phone]

## 2018-02-11 NOTE — Discharge Instructions (Addendum)
Please follow-up with your GI doctor primary care doctor.  Return to ED for worsening symptoms.

## 2018-05-17 ENCOUNTER — Other Ambulatory Visit (HOSPITAL_COMMUNITY): Payer: Self-pay | Admitting: Internal Medicine

## 2018-05-17 DIAGNOSIS — R195 Other fecal abnormalities: Secondary | ICD-10-CM

## 2018-05-24 ENCOUNTER — Ambulatory Visit (HOSPITAL_COMMUNITY)
Admission: RE | Admit: 2018-05-24 | Discharge: 2018-05-24 | Disposition: A | Discharge status: Home / Self Care | Payer: Self-pay | Source: Ambulatory Visit | Attending: Internal Medicine | Admitting: Internal Medicine

## 2018-05-24 DIAGNOSIS — R195 Other fecal abnormalities: Secondary | ICD-10-CM | POA: Insufficient documentation

## 2018-05-29 ENCOUNTER — Ambulatory Visit (HOSPITAL_COMMUNITY): Payer: Self-pay

## 2018-05-29 ENCOUNTER — Encounter (HOSPITAL_COMMUNITY): Payer: Self-pay

## 2018-06-20 ENCOUNTER — Ambulatory Visit (HOSPITAL_COMMUNITY)
Admission: RE | Admit: 2018-06-20 | Discharge: 2018-06-20 | Disposition: A | Discharge status: Home / Self Care | Payer: Self-pay | Source: Ambulatory Visit | Attending: Internal Medicine | Admitting: Internal Medicine

## 2018-06-20 DIAGNOSIS — R195 Other fecal abnormalities: Secondary | ICD-10-CM | POA: Insufficient documentation

## 2018-07-03 ENCOUNTER — Encounter: Payer: Self-pay | Admitting: Nurse Practitioner

## 2018-07-03 ENCOUNTER — Ambulatory Visit: Payer: Self-pay | Admitting: Nurse Practitioner

## 2018-07-03 VITALS — BP 140/84 | HR 74 | Ht 70.0 in | Wt 231.8 lb

## 2018-07-03 DIAGNOSIS — K639 Disease of intestine, unspecified: Secondary | ICD-10-CM

## 2018-07-03 DIAGNOSIS — R195 Other fecal abnormalities: Secondary | ICD-10-CM

## 2018-07-03 MED ORDER — NA SULFATE-K SULFATE-MG SULF 17.5-3.13-1.6 GM/177ML PO SOLN: ORAL | 0 refills | Status: DC

## 2018-07-03 NOTE — Patient Instructions (Signed)
If you are age 60 or older, your body mass index should be between 23-30. Your Body mass index is 33.26 kg/m. If this is out of the aforementioned range listed, please consider follow up with your Primary Care Provider.  If you are age 34 or younger, your body mass index should be between 19-25. Your Body mass index is 33.26 kg/m. If this is out of the aformentioned range listed, please consider follow up with your Primary Care Provider.   You have been scheduled for a colonoscopy. Please follow written instructions given to you at your visit today.  Please pick up your prep supplies at the pharmacy within the next 1-3 days. If you use inhalers (even only as needed), please bring them with you on the day of your procedure. Your physician has requested that you go to www.startemmi.com and enter the access code given to you at your visit today. This web site gives a general overview about your procedure. However, you should still follow specific instructions given to you by our office regarding your preparation for the procedure.  We have sent the following medications to your pharmacy for you to pick up at your convenience: Suprep  Thank you for choosing me and [facility].   Tye Savoy, NP

## 2018-07-03 NOTE — Progress Notes (Addendum)
Chief Complaint:    Positive IFOBT   IMPRESSION and PLAN:    1. 17 old male with positive IFOBT and an 18 mm ascending colon polyp on barium enema.  No overt bleeding, bowel changes or family history of colon cancer.  Patient has never had a colonoscopy but is agreeable to one.  -The risks and benefits of colonoscopy with possible polypectomy were discussed and the patient agrees to proceed.  -I was unclear why patient had a barium enema for positive IFOBT.  I requested and obtained records from PCP from 03/20/2018 visit.  Patient was sent for BE as he preferred to wait until age 1 to get colonoscopy (? Insurance reasons).  Office note included labs from 02/14/2017 revealing normal hemoglobin of 15.4, normal MVC 92.4, normal liver tests  2. GERD / hx of food impactions / Schatzki's ring. Last dilation April 2019. Doing well since.  -continue PPI  Addendum: Reviewed and agree with assessment and management plan to proceed with colonoscopy. Pyrtle, Lajuan Lines, MD     HPI:     Patient is a 58 yo male known to Dr. Hilarie Fredrickson.  He has a history of hyperplastic gastric polyps / gastritis/ 3 cm hiatal hernia / GERD / food impaction March and April 2019 /Schatzki's ring requiring dilation (last one 10/10/17). Patient is referred by PCP Dr. Leanna Battles For evaluation of positive IFOBT.  For unclear reasons (I don't have PCP's records). he was sent for a barium enema for further evaluation which did show an 18 mm ascending colon polyp.  Patient denies bowel changes, blood in stool, unexplained weight loss, abdominal pain or any other GI symptoms.  No family history of colon cancer.  Patient has never had a colonoscopy.  Regarding GERD and swallowing, he has been doing very well.  No GERD symptoms on daily PPI and no dysphagia since last esophageal dilation April 2019  EGD April 2019Low-grade of narrowing Schatzki ring. Dilated to 15 mm with good result. - 3 cm hiatal hernia. - Two 1 cm gastric  polyps with adherent heme. Resected and retrieved. Clips were placed. - Multiple small gastric polyps. - Nodular gastritis. Biopsied. - Normal examined duodenum.  Diagnosis 1. Stomach, polyp(s) - BENIGN GASTRIC HYPERPLASTIC POLYP(S) WITH EROSIONS. 2. Stomach, biopsy, antrum - GASTRIC ANTRAL MUCOSA WITH NON-SPECIFIC REACTIVE GASTROPATHY. H[place] Dyer STAIN IS NEGATIVE FOR HELICOBACTER PYLORI  Review of systems:     No chest pain, no SOB, no fevers, no urinary sx   Past Medical History:  Diagnosis Date  . GERD (gastroesophageal reflux disease)   . High cholesterol   . Hypertension     Patient's surgical history, family medical history, social history, medications and allergies were all reviewed in Epic   Creatinine clearance cannot be calculated (No successful lab value found.)  Current Outpatient Medications  Medication Sig Dispense Refill  . amLODipine (NORVASC) 5 MG tablet Take 5 mg by mouth daily.    Marland Kitchen losartan-hydrochlorothiazide (HYZAAR) 100-12.5 MG tablet Take 1 tablet by mouth daily.    Marland Kitchen lovastatin (MEVACOR) 20 MG tablet Take 40 mg by mouth daily.    Marland Kitchen omeprazole (PRILOSEC) 20 MG capsule Take 2 capsules (40 mg total) by mouth daily. (Patient taking differently: Take 20 mg by mouth daily. ) 60 capsule 0   No current facility-administered medications for this visit.     Physical Exam:     BP 140/84   Pulse 74   Ht 5\' 10"  (1.778 m)  Wt 231 lb 12.8 oz (105.1 kg)   SpO2 96%   BMI 33.26 kg/m   GENERAL:  Pleasant male in NAD PSYCH: : Cooperative, normal affect EENT:  conjunctiva pink, mucous membranes moist, neck supple without masses CARDIAC:  RRR, no murmur heard, no peripheral edema PULM: Normal respiratory effort, lungs CTA bilaterally, no wheezing ABDOMEN:  Nondistended, soft, nontender. No obvious masses, no hepatomegaly,  normal bowel sounds SKIN:  turgor, no lesions seen Musculoskeletal:  Normal muscle tone, normal strength NEURO: Alert and oriented  x 3, no focal neurologic deficits   Tye Savoy , NP 07/03/2018, 8:39 AM Cc: Leanna Battles, MD

## 2018-07-24 ENCOUNTER — Encounter: Payer: Self-pay | Admitting: Internal Medicine

## 2018-07-24 ENCOUNTER — Ambulatory Visit (AMBULATORY_SURGERY_CENTER): Payer: Self-pay | Admitting: Internal Medicine

## 2018-07-24 VITALS — BP 141/90 | HR 71 | Temp 97.8°F | Resp 15 | Ht 70.0 in | Wt 231.0 lb

## 2018-07-24 DIAGNOSIS — D123 Benign neoplasm of transverse colon: Secondary | ICD-10-CM

## 2018-07-24 DIAGNOSIS — D122 Benign neoplasm of ascending colon: Secondary | ICD-10-CM

## 2018-07-24 DIAGNOSIS — R195 Other fecal abnormalities: Secondary | ICD-10-CM

## 2018-07-24 DIAGNOSIS — D124 Benign neoplasm of descending colon: Secondary | ICD-10-CM

## 2018-07-24 DIAGNOSIS — R933 Abnormal findings on diagnostic imaging of other parts of digestive tract: Secondary | ICD-10-CM

## 2018-07-24 DIAGNOSIS — D125 Benign neoplasm of sigmoid colon: Secondary | ICD-10-CM

## 2018-07-24 MED ORDER — SODIUM CHLORIDE 0.9 % IV SOLN: 500.0000 mL | Freq: Once | INTRAVENOUS | Status: DC

## 2018-07-24 NOTE — Patient Instructions (Signed)
Please read handouts provided. Await pathology results. Continue present medications. No Ibuprofen, Naproxen, or other non-steroidal anti-inflammatory drugs for 3 weeks.      YOU HAD AN ENDOSCOPIC PROCEDURE TODAY AT [facility]:   Refer to the procedure report that was given to you for any specific questions about what was found during the examination.  If the procedure report does not answer your questions, please call your gastroenterologist to clarify.  If you requested that your care partner not be given the details of your procedure findings, then the procedure report has been included in a sealed envelope for you to review at your convenience later.  YOU SHOULD EXPECT: Some feelings of bloating in the abdomen. Passage of more gas than usual.  Walking can help get rid of the air that was put into your GI tract during the procedure and reduce the bloating. If you had a lower endoscopy (such as a colonoscopy or flexible sigmoidoscopy) you may notice spotting of blood in your stool or on the toilet paper. If you underwent a bowel prep for your procedure, you may not have a normal bowel movement for a few days.  Please Note:  You might notice some irritation and congestion in your nose or some drainage.  This is from the oxygen used during your procedure.  There is no need for concern and it should clear up in a day or so.  SYMPTOMS TO REPORT IMMEDIATELY:   Following lower endoscopy (colonoscopy or flexible sigmoidoscopy):  Excessive amounts of blood in the stool  Significant tenderness or worsening of abdominal pains  Swelling of the abdomen that is new, acute  Fever of 100F or higher    For urgent or emergent issues, a gastroenterologist can be reached at any hour by calling [phone].   DIET:  We do recommend a small meal at first, but then you may proceed to your regular diet.  Drink plenty of fluids but you should avoid alcoholic beverages for 24  hours.  ACTIVITY:  You should plan to take it easy for the rest of today and you should NOT DRIVE or use heavy machinery until tomorrow (because of the sedation medicines used during the test).    FOLLOW UP: Our staff will call the number listed on your records the next business day following your procedure to check on you and address any questions or concerns that you may have regarding the information given to you following your procedure. If we do not reach you, we will leave a message.  However, if you are feeling well and you are not experiencing any problems, there is no need to return our call.  We will assume that you have returned to your regular daily activities without incident.  If any biopsies were taken you will be contacted by phone or by letter within the next 1-3 weeks.  Please call us at [phone] if you have not heard about the biopsies in 3 weeks.    SIGNATURES/CONFIDENTIALITY: You and/or your care partner have signed paperwork which will be entered into your electronic medical record.  These signatures attest to the fact that that the information above on your After Visit Summary has been reviewed and is understood.  Full responsibility of the confidentiality of this discharge information lies with you and/or your care-partner.

## 2018-07-24 NOTE — Progress Notes (Signed)
Called to room to assist during endoscopic procedure.  Patient ID and intended procedure confirmed with present staff. Received instructions for my participation in the procedure from the performing physician.  

## 2018-07-24 NOTE — Progress Notes (Signed)
Report to PACU, RN, vss, BBS= Clear.  

## 2018-07-24 NOTE — Op Note (Signed)
[facility] Patient Name: Nana Hoselton Procedure Date: 07/24/2018 2:42 PM MRN: [id number] Endoscopist: Jerene Bears , MD Age: 58 Referring MD:  Date of Birth: August 13, 1960 Gender: Male Account #: [id number] Procedure:                Colonoscopy Indications:              Abnormal barium enema, Positive fecal                            immunochemical test Medicines:                Monitored Anesthesia Care Procedure:                Pre-Anesthesia Assessment:                           - Prior to the procedure, a History and Physical                            was performed, and patient medications and                            allergies were reviewed. The patient's tolerance of                            previous anesthesia was also reviewed. The risks                            and benefits of the procedure and the sedation                            options and risks were discussed with the patient.                            All questions were answered, and informed consent                            was obtained. Prior Anticoagulants: The patient has                            taken no previous anticoagulant or antiplatelet                            agents. ASA Grade Assessment: II - A patient with                            mild systemic disease. After reviewing the risks                            and benefits, the patient was deemed in                            satisfactory condition to undergo the procedure.  After obtaining informed consent, the colonoscope                            was passed under direct vision. Throughout the                            procedure, the patient's blood pressure, pulse, and                            oxygen saturations were monitored continuously. The                            Colonoscope was introduced through the anus and                            advanced to the cecum, identified by appendiceal                    orifice and ileocecal valve. The colonoscopy was                            performed without difficulty. The patient tolerated                            the procedure well. The quality of the bowel                            preparation was good. The ileocecal valve,                            appendiceal orifice, and rectum were photographed. Scope In: 2:56:58 PM Scope Out: 3:26:15 PM Scope Withdrawal Time: 0 hours 26 minutes 15 seconds  Total Procedure Duration: 0 hours 29 minutes 17 seconds  Findings:                 The digital rectal exam was normal.                           A 7 mm polyp was found in the ascending colon. The                            polyp was sessile. The polyp was removed with a                            cold snare. Resection and retrieval were complete.                           A 25 mm polyp was found in the distal ascending                            colon. The polyp was semi-pedunculated. The polyp                            was removed with a hot snare. Resection and  retrieval were complete.                           A 7 mm polyp was found in the transverse colon. The                            polyp was sessile. The polyp was removed with a                            cold snare. Resection and retrieval were complete.                           A 6 mm polyp was found in the descending colon. The                            polyp was sessile. The polyp was removed with a                            cold snare. Resection and retrieval were complete.                           A 5 mm polyp was found in the sigmoid colon. The                            polyp was sessile. The polyp was removed with a                            cold snare. Resection and retrieval were complete.                           A 10 mm polyp was found in the distal sigmoid                            colon. The polyp was semi-pedunculated. The  polyp                            was removed with a hot snare. Resection and                            retrieval were complete.                           Internal hemorrhoids were found during                            retroflexion. The hemorrhoids were small. Complications:            No immediate complications. Estimated Blood Loss:     Estimated blood loss was minimal. Impression:               - One 7 mm polyp in the ascending colon, removed  with a cold snare. Resected and retrieved.                           - One 25 mm polyp in the distal ascending colon,                            removed with a hot snare. Resected and retrieved.                           - One 7 mm polyp in the transverse colon, removed                            with a cold snare. Resected and retrieved.                           - One 6 mm polyp in the descending colon, removed                            with a cold snare. Resected and retrieved.                           - One 5 mm polyp in the sigmoid colon, removed with                            a cold snare. Resected and retrieved.                           - One 10 mm polyp in the distal sigmoid colon,                            removed with a hot snare. Resected and retrieved.                           - Internal hemorrhoids. Recommendation:           - Patient has a contact number available for                            emergencies. The signs and symptoms of potential                            delayed complications were discussed with the                            patient. Return to normal activities tomorrow.                            Written discharge instructions were provided to the                            patient.                           - Resume previous diet.                           -  Continue present medications.                           - Await pathology results.                           - Repeat  colonoscopy is recommended for                            surveillance. The colonoscopy date will be                            determined after pathology results from today's                            exam become available for review.                           - No ibuprofen, naproxen, or other non-steroidal                            anti-inflammatory drugs for 3 weeks after polyp                            removal. Jerene Bears, MD 07/24/2018 3:30:51 PM This report has been signed electronically.

## 2018-07-25 ENCOUNTER — Telehealth: Payer: Self-pay | Admitting: *Deleted

## 2018-07-25 ENCOUNTER — Telehealth: Payer: Self-pay

## 2018-07-25 NOTE — Telephone Encounter (Signed)
No answer for post procedure call back. Left message for patient to call back and will attempt to call back later this afternoon. Sm

## 2018-07-25 NOTE — Telephone Encounter (Signed)
  Follow up Call-  Call back number 07/24/2018  Post procedure Call Back phone  # [phone]  Permission to leave phone message Yes  Some recent data might be hidden     Patient questions:  Do you have a fever, pain , or abdominal swelling? No. Pain Score  0 *  Have you tolerated food without any problems? Yes.    Have you been able to return to your normal activities? Yes.    Do you have any questions about your discharge instructions: Diet   No. Medications  No. Follow up visit  No.  Do you have questions or concerns about your Care? No.  Actions: * If pain score is 4 or above: No action needed, pain <4.

## 2018-07-31 ENCOUNTER — Encounter: Payer: Self-pay | Admitting: Internal Medicine

## 2018-10-08 ENCOUNTER — Encounter (HOSPITAL_COMMUNITY): Payer: Self-pay

## 2018-10-08 ENCOUNTER — Other Ambulatory Visit: Payer: Self-pay

## 2018-10-08 ENCOUNTER — Encounter (HOSPITAL_COMMUNITY)
Admission: EM | Disposition: A | Discharge status: Home / Self Care | Payer: Self-pay | Source: Home / Self Care | Attending: Emergency Medicine

## 2018-10-08 ENCOUNTER — Emergency Department (HOSPITAL_COMMUNITY)
Admission: EM | Admit: 2018-10-08 | Discharge: 2018-10-09 | Disposition: A | Discharge status: Home / Self Care | Payer: Self-pay | Attending: Emergency Medicine | Admitting: Emergency Medicine

## 2018-10-08 DIAGNOSIS — T18128A Food in esophagus causing other injury, initial encounter: Secondary | ICD-10-CM | POA: Insufficient documentation

## 2018-10-08 DIAGNOSIS — X58XXXA Exposure to other specified factors, initial encounter: Secondary | ICD-10-CM | POA: Insufficient documentation

## 2018-10-08 DIAGNOSIS — K222 Esophageal obstruction: Secondary | ICD-10-CM | POA: Insufficient documentation

## 2018-10-08 DIAGNOSIS — Q399 Congenital malformation of esophagus, unspecified: Secondary | ICD-10-CM | POA: Insufficient documentation

## 2018-10-08 DIAGNOSIS — K219 Gastro-esophageal reflux disease without esophagitis: Secondary | ICD-10-CM | POA: Insufficient documentation

## 2018-10-08 DIAGNOSIS — K317 Polyp of stomach and duodenum: Secondary | ICD-10-CM | POA: Insufficient documentation

## 2018-10-08 DIAGNOSIS — Z79899 Other long term (current) drug therapy: Secondary | ICD-10-CM | POA: Insufficient documentation

## 2018-10-08 DIAGNOSIS — I10 Essential (primary) hypertension: Secondary | ICD-10-CM | POA: Insufficient documentation

## 2018-10-08 DIAGNOSIS — E7800 Pure hypercholesterolemia, unspecified: Secondary | ICD-10-CM | POA: Insufficient documentation

## 2018-10-08 HISTORY — PX: ESOPHAGOGASTRODUODENOSCOPY: SHX5428

## 2018-10-08 HISTORY — PX: FOREIGN BODY REMOVAL: SHX962

## 2018-10-08 SURGERY — EGD (ESOPHAGOGASTRODUODENOSCOPY)
Anesthesia: Moderate Sedation

## 2018-10-08 MED ORDER — MIDAZOLAM HCL (PF) 5 MG/ML IJ SOLN
INTRAMUSCULAR | Status: AC
  Filled 2018-10-08: qty 3

## 2018-10-08 MED ORDER — FENTANYL CITRATE (PF) 100 MCG/2ML IJ SOLN
INTRAMUSCULAR | Status: AC
  Filled 2018-10-08: qty 4

## 2018-10-08 MED ORDER — GLUCAGON HCL RDNA (DIAGNOSTIC) 1 MG IJ SOLR
1.0000 mg | Freq: Once | INTRAMUSCULAR | Status: AC
  Administered 2018-10-08: 1 mg via INTRAVENOUS
  Filled 2018-10-08: qty 1

## 2018-10-08 MED ORDER — FENTANYL CITRATE (PF) 100 MCG/2ML IJ SOLN
INTRAMUSCULAR | Status: DC | PRN
  Administered 2018-10-08 (×2): 25 ug via INTRAVENOUS

## 2018-10-08 MED ORDER — DIPHENHYDRAMINE HCL 50 MG/ML IJ SOLN
INTRAMUSCULAR | Status: AC
  Filled 2018-10-08: qty 1

## 2018-10-08 MED ORDER — MIDAZOLAM HCL (PF) 10 MG/2ML IJ SOLN
INTRAMUSCULAR | Status: DC | PRN
  Administered 2018-10-08: 1 mg via INTRAVENOUS
  Administered 2018-10-08 (×2): 2 mg via INTRAVENOUS

## 2018-10-08 MED ORDER — LORAZEPAM 2 MG/ML IJ SOLN
0.5000 mg | Freq: Once | INTRAMUSCULAR | Status: AC
  Administered 2018-10-08: 0.5 mg via INTRAVENOUS
  Filled 2018-10-08: qty 1

## 2018-10-08 NOTE — ED Provider Notes (Signed)
[facility] DEPT Provider Note   CSN: [id number] Arrival date & time: 10/08/18  1924    History   Chief Complaint Chief Complaint  Patient presents with  . foreign object struck in throat    HPI Jeffrey Potter is a 58 y.o. male.     Patient with history of Schatzki's ring, last outpatient dilation performed 1 year ago -presents with complaint of food bolus impaction similar to previous.  Patient states that he was eating chicken around 3 PM and felt it get stuck in the middle of his chest.  Since that time he has been unable to swallow or handle secretions well.  He states that he feels some pressure in his chest associated with this.  No shortness of breath or cough.  No choking.  No infectious type symptoms.  Patient has had 3 previous episodes.  The first 2 required endoscopy to pass the food bolus.  The third cleared spontaneously after administration of glucagon and Ativan.  Onset of symptoms acute.  Course is constant.     Past Medical History:  Diagnosis Date  . GERD (gastroesophageal reflux disease)   . High cholesterol   . Hypertension       Patient Active Problem List   Diagnosis Date Noted  . Gastric polyps   . Gastritis without bleeding   . Nodularity of gastric antrum   . Esophageal dysphagia   . Food impaction of esophagus   . Acute esophageal obstruction   . Schatzki's ring     Past Surgical History:  Procedure Laterality Date  . BALLOON DILATION N/A 10/10/2017   Procedure: BALLOON DILATION;  Surgeon: Jerene Bears, MD;  Location: [facility];  Service: Gastroenterology;  Laterality: N/A;  . ESOPHAGOGASTRODUODENOSCOPY N/A 08/18/2017   Procedure: ESOPHAGOGASTRODUODENOSCOPY (EGD);  Surgeon: Jerene Bears, MD;  Location: [facility];  Service: Gastroenterology;  Laterality: N/A;  . ESOPHAGOGASTRODUODENOSCOPY N/A 09/17/2017   Procedure: ESOPHAGOGASTRODUODENOSCOPY (EGD);  Surgeon: Carol Ada, MD;  Location: [facility];   Service: Endoscopy;  Laterality: N/A;  . ESOPHAGOGASTRODUODENOSCOPY (EGD) WITH PROPOFOL N/A 10/10/2017   Procedure: ESOPHAGOGASTRODUODENOSCOPY (EGD) WITH PROPOFOL;  Surgeon: Jerene Bears, MD;  Location: [facility];  Service: Gastroenterology;  Laterality: N/A;  with biopsy of gastric polyps        Home Medications    Prior to Admission medications   Medication Sig Start Date End Date Taking? Authorizing Provider  amLODipine (NORVASC) 5 MG tablet Take 5 mg by mouth daily.    [provider]  losartan-hydrochlorothiazide (HYZAAR) 100-12.5 MG tablet Take 1 tablet by mouth daily.    [provider]  lovastatin (MEVACOR) 20 MG tablet Take 40 mg by mouth daily.    [provider]  omeprazole (PRILOSEC) 20 MG capsule Take 2 capsules (40 mg total) by mouth daily. Patient taking differently: Take 20 mg by mouth daily.  08/18/17   Charlesetta Shanks, MD    Family History Family History  Problem Relation Age of Onset  . Cancer Father     Social History Social History   Tobacco Use  . Smoking status: Never Smoker  . Smokeless tobacco: Never Used  Substance Use Topics  . Alcohol use: Yes    Comment: occasionally  . Drug use: No     Allergies   Patient has no known allergies.   Review of Systems Review of Systems  Constitutional: Negative for fever.  HENT: Positive for trouble swallowing. Negative for rhinorrhea and sore throat.   Eyes:  Negative for redness.  Respiratory: Positive for chest tightness. Negative for cough.   Cardiovascular: Negative for chest pain.  Gastrointestinal: Negative for abdominal pain, diarrhea, nausea and vomiting.  Genitourinary: Negative for dysuria.  Musculoskeletal: Negative for myalgias.  Skin: Negative for rash.  Neurological: Negative for headaches.     Physical Exam Updated Vital Signs BP (!) 136/102 (BP Location: Right Arm) Comment: pt states hx of HTN; on meds  Pulse 94   Temp 98.8 F (37.1 C) (Oral)   Resp  16   Ht 5\' 10"  (1.778 m)   Wt 106.5 kg   SpO2 95%   BMI 33.70 kg/m   Physical Exam Vitals signs and nursing note reviewed.  Constitutional:      Appearance: He is well-developed.  HENT:     Head: Normocephalic and atraumatic.     Mouth/Throat:     Comments: Regurgitates after swallowing sips of liquids Eyes:     Conjunctiva/sclera: Conjunctivae normal.  Neck:     Musculoskeletal: Normal range of motion and neck supple.  Pulmonary:     Effort: No respiratory distress.  Skin:    General: Skin is warm and dry.  Neurological:     Mental Status: He is alert.      ED Treatments / Results  Labs (all labs ordered are listed, but only abnormal results are displayed) Labs Reviewed - No data to display  EKG None  Radiology No results found.  Procedures Procedures (including critical care time)  Medications Ordered in ED Medications  glucagon (human recombinant) (GLUCAGEN) injection 1 mg (1 mg Intravenous Given 10/08/18 2015)  LORazepam (ATIVAN) injection 0.5 mg (0.5 mg Intravenous Given 10/08/18 2013)     Initial Impression / Assessment and Plan / ED Course  I have reviewed the triage vital signs and the nursing notes.  Pertinent labs & imaging results that were available during my care of the patient were reviewed by me and considered in my medical decision making (see chart for details).        Patient seen and examined. Will give a trial of glucagon and ativan.   Vital signs reviewed and are as follows: BP (!) 136/102 (BP Location: Right Arm) Comment: pt states hx of HTN; on meds  Pulse 94   Temp 98.8 F (37.1 C) (Oral)   Resp 16   Ht 5\' 10"  (1.778 m)   Wt 106.5 kg   SpO2 95%   BMI 33.70 kg/m   8:36 PM Patient cannot tolerate sips of cola. He regurgitates and dry heaves with attempt. Will speak with [facility] regarding patient.   8:45 PM Spoke with Dr. Havery Moros. Plan to call in endoscopy for food bolus impaction. Pt updated. No change in condition.    10:57 PM endoscopy performed about 1 hour ago.  Patient is sitting up in bed reading his endoscopy report.  He states that he is feeling better.  Plan for d/c home.   Final Clinical Impressions(s) / ED Diagnoses   Final diagnoses:  Esophageal obstruction due to food impaction   Patient with esophageal food obstruction.  GI has seen and cleared obstruction during endoscopy.  Patient is recovering well.  He is feeling better.  Plan for discharged home with GI follow-up.  Patient given instructions per GI.   ED Discharge Orders    None       Carlisle Cater, Hershal Coria 10/08/18 2259    Valarie Merino, MD 10/08/18 [phone]

## 2018-10-08 NOTE — Discharge Instructions (Signed)
Please read and follow all provided instructions.  Your diagnoses today include:  1. Esophageal obstruction due to food impaction     Tests performed today include:  Vital signs. See below for your results today.   Medications prescribed:   None  Home care instructions:  Follow any educational materials contained in this packet.  Follow-up instructions: Please follow-up with your primary care provider as needed for further evaluation of your symptoms.  Return instructions:   Please return to the Emergency Department if you experience worsening symptoms.   Please return if you have any other emergent concerns.  Additional Information:  Your vital signs today were: BP 132/90    Pulse 75    Temp 98.2 F (36.8 C) (Oral)    Resp 16    Ht 5\' 10"  (1.778 m)    Wt 106.5 kg    SpO2 97%    BMI 33.70 kg/m  If your blood pressure (BP) was elevated above 135/85 this visit, please have this repeated by your doctor within one month. ---------------

## 2018-10-08 NOTE — ED Notes (Signed)
Pt alert and oriented, tolerating fluids, no longer needing oxygen and ambulatory.

## 2018-10-08 NOTE — Op Note (Signed)
[facility] Patient Name: Jeffrey Potter Procedure Date: 10/08/2018 MRN: [id number] Attending MD: Carlota Raspberry. Havery Moros , MD Date of Birth: [birth year] CSN: [id number] Age: 58 Admit Type: Emergency Department Procedure:                Upper GI endoscopy Indications:              Foreign body in the esophagus - food impaction,                            history of Shatski ring Providers:                Carlota Raspberry. Havery Moros, MD, Vista Lawman, RN, Marguerita Merles, Technician, Charolette Child, Technician Referring MD:              Medicines:                Fentanyl 50 micrograms IV, Midazolam 5 mg IV Complications:            No immediate complications. Estimated blood loss:                            Minimal. Estimated Blood Loss:     Estimated blood loss was minimal. Procedure:                Pre-Anesthesia Assessment:                           - Prior to the procedure, a History and Physical                            was performed, and patient medications and                            allergies were reviewed. The patient's tolerance of                            previous anesthesia was also reviewed. The risks                            and benefits of the procedure and the sedation                            options and risks were discussed with the patient.                            All questions were answered, and informed consent                            was obtained. Prior Anticoagulants: The patient has                            taken no previous anticoagulant or antiplatelet  agents. ASA Grade Assessment: II - A patient with                            mild systemic disease. After reviewing the risks                            and benefits, the patient was deemed in                            satisfactory condition to undergo the procedure.                           After obtaining informed consent, the  endoscope was                            passed under direct vision. Throughout the                            procedure, the patient's blood pressure, pulse, and                            oxygen saturations were monitored continuously. The                            [id number]-H190 ([id number]) Olympus gastroscope was                            introduced through the mouth, and advanced to the                            antrum of the stomach. The upper GI endoscopy was                            accomplished without difficulty. The patient                            tolerated the procedure well. Scope In: Scope Out: Findings:      Esophagogastric landmarks were identified: the Z-line was found at 36       cm, the gastroesophageal junction was found at 36 cm and the upper       extent of the gastric folds was found at 42 cm from the incisors.      A 6 cm hiatal hernia was present.      A large piece of chicken was found at the gastroesophageal junction       sitting proximally to [facility] ring. Removal of food was accomplished       easily by pushing the food into the stomach with minimal resistance.      One benign-appearing, intrinsic mild stenosis was found 36 cm from the       incisors. Dilation not performed during this exam given residual food in       the stomach.      The examined esophagus was tortuous.      The exam of the esophagus was otherwise normal.      A few small  sessile polyps were found in the gastric body and in the       gastric antrum, one had some old heme adherent to it, no active bleeding.      The exam of the stomach was otherwise normal other than some retained       food. Close exam of the antrum not done, duodenum not examined given       some residual food in the stomach. Impression:               - Esophagogastric landmarks identified.                           - 6 cm hiatal hernia.                           - Food at the gastroesophageal junction. Removal                             was successful.                           - Benign-appearing esophageal stenosis.                           - Tortuous esophagus.                           - A few benign appearing gastric polyps.                           Overall, the Shatski ring does not have a                            significantly narrowed lumen, I suspect impaction                            could have been largely driven by the large size of                            the food bolus ingested, but would recommend                            outpatient dilation to help prevent recurrence. Moderate Sedation:      Moderate (conscious) sedation was administered by the endoscopy nurse       and supervised by the endoscopist. The following parameters were       monitored: oxygen saturation, heart rate, blood pressure, and response       to care. Total physician intraservice time was 12 minutes. Recommendation:           - Okay to be discharged from the ED this evening if                            the patient has a ride home. Patient cannot drive                            tonight.  Patient has a contact number available for                            emergencies. The signs and symptoms of potential                            delayed complications were discussed with the                            patient. Return to normal activities tomorrow.                            Written discharge instructions were provided to the                            patient.                           - Okay to resume soft diet. Avoid meat / chicken                            until follow up endoscopy.                           - Continue present medications.                           - Repeat upper endoscopy with Dr. Hilarie Fredrickson as                            outpatient incoming weeks to dilate stricture given                            recurrent food impaction Procedure Code(s):        --- Professional ---                            [phone], 52, Esophagogastroduodenoscopy, flexible,                            transoral; with removal of foreign body(s)                           99152, 59, Moderate sedation services provided by                            the same physician or other qualified health care                            professional performing the diagnostic or                            therapeutic service that the sedation supports,  requiring the presence of an independent trained                            observer to assist in the monitoring of the                            patient's level of consciousness and physiological                            status; initial 15 minutes of intraservice time,                            patient age 32 years or older Diagnosis Code(s):        --- Professional ---                           K44.9, Diaphragmatic hernia without obstruction or                            gangrene                           T18.128A, Food in esophagus causing other injury,                            initial encounter                           K22.2, Esophageal obstruction                           Q39.9, Congenital malformation of esophagus,                            unspecified                           K31.7, Polyp of stomach and duodenum                           T18.108A, Unspecified foreign body in esophagus                            causing other injury, initial encounter CPT copyright 2019 [employer]. All rights reserved. The codes documented in this report are preliminary and upon coder review may  be revised to meet current compliance requirements. Remo Lipps P. [place] Jeff, MD 10/08/2018 10:35:15 PM This report has been signed electronically. Number of Addenda: 0

## 2018-10-08 NOTE — H&P (View-Only) (Signed)
Consultation  Referring Provider:     Carlisle Cater PA Primary Care Physician:  Leanna Battles, MD Primary Gastroenterologist:    Zenovia Jarred Reason for Consultation:     Food impaction         HPI:   Jeffrey Potter is a 58 y.o. 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 He reports he ate chicken around 3 PM, states he ate too fat, one large piece went down and he thinks got stuck. Has not been able to get it to go down or come back up. Glucagon given in the ED without benefit. He denies any chest pain but feels uncomfortable not being able to tolerate secretions, spitting into bag at bedside. No abdominal pain. No shortness of breath or breathing problems. No fevers or recent upper respiratory symptoms.   Last EGD done April 2019, Shatski ring was dilated to 18mm and some inflamed hyperplastic gastric polyps removed. Since his last EGD he has not had much swallowing difficulty at baseline. He thinks this occurred today due to not chewing well, swallowing large piece of chicken.    Past Medical History:  Diagnosis Date  . GERD (gastroesophageal reflux disease)   . High cholesterol   . Hypertension     Past Surgical History:  Procedure Laterality Date  . BALLOON DILATION N/A 10/10/2017   Procedure: BALLOON DILATION;  Surgeon: Jerene Bears, MD;  Location: [facility];  Service: Gastroenterology;  Laterality: N/A;  . ESOPHAGOGASTRODUODENOSCOPY N/A 08/18/2017   Procedure: ESOPHAGOGASTRODUODENOSCOPY (EGD);  Surgeon: Jerene Bears, MD;  Location: [facility];  Service: Gastroenterology;  Laterality: N/A;  . ESOPHAGOGASTRODUODENOSCOPY N/A 09/17/2017   Procedure: ESOPHAGOGASTRODUODENOSCOPY (EGD);  Surgeon: Carol Ada, MD;  Location: [facility];  Service: Endoscopy;  Laterality: N/A;  . ESOPHAGOGASTRODUODENOSCOPY (EGD) WITH PROPOFOL N/A 10/10/2017   Procedure: ESOPHAGOGASTRODUODENOSCOPY (EGD) WITH PROPOFOL;   Surgeon: Jerene Bears, MD;  Location: [facility];  Service: Gastroenterology;  Laterality: N/A;  with biopsy of gastric polyps    Family History  Problem Relation Age of Onset  . Cancer Father      Social History   Tobacco Use  . Smoking status: Never Smoker  . Smokeless tobacco: Never Used  Substance Use Topics  . Alcohol use: Yes    Comment: occasionally  . Drug use: No    Prior to Admission medications   Medication Sig Start Date End Date Taking? Authorizing Provider  amLODipine (NORVASC) 5 MG tablet Take 5 mg by mouth daily.   Yes [provider]  losartan-hydrochlorothiazide (HYZAAR) 100-12.5 MG tablet Take 1 tablet by mouth daily.   Yes [provider]  lovastatin (MEVACOR) 20 MG tablet Take 40 mg by mouth daily.   Yes [provider]  Omega-3 Fatty Acids (FISH OIL) 1000 MG CAPS Take 2,000 mg by mouth daily.   Yes [provider]  omeprazole (PRILOSEC) 20 MG capsule Take 2 capsules (40 mg total) by mouth daily. Patient taking differently: Take 20 mg by mouth daily.  08/18/17  Yes Charlesetta Shanks, MD    No current facility-administered medications for this encounter.    Current Outpatient Medications  Medication Sig Dispense Refill  . amLODipine (NORVASC) 5 MG tablet Take 5 mg by mouth daily.    Marland Kitchen losartan-hydrochlorothiazide (HYZAAR) 100-12.5 MG tablet Take 1 tablet by mouth daily.    Marland Kitchen lovastatin (MEVACOR) 20 MG tablet Take 40 mg by  mouth daily.    . Omega-3 Fatty Acids (FISH OIL) 1000 MG CAPS Take 2,000 mg by mouth daily.    Marland Kitchen omeprazole (PRILOSEC) 20 MG capsule Take 2 capsules (40 mg total) by mouth daily. (Patient taking differently: Take 20 mg by mouth daily. ) 60 capsule 0    Allergies as of 10/08/2018  . (No Known Allergies)     Review of Systems:    As per HPI, otherwise negative    Physical Exam:  Vital signs in last 24 hours: Temp:  [98.8 F (37.1 C)] 98.8 F (37.1 C) (04/27 1941) Pulse Rate:  [94] 94 (04/27  1941) Resp:  [16] 16 (04/27 1941) BP: (136)/(102) 136/102 (04/27 1941) SpO2:  [95 %] 95 % (04/27 1941) Weight:  [106.5 kg] 106.5 kg (04/27 1941)   General:   Pleasant male in NAD Head:  Normocephalic and atraumatic. Eyes:   No icterus.   Conjunctiva pink. Ears:  Normal auditory acuity. Neck:  Supple Lungs:  Respirations even and unlabored. Lungs clear to auscultation bilaterally.   No wheezes, crackles, or rhonchi.  Heart:  Regular rate and rhythm; no MRG Abdomen:  Soft, nondistended, nontender. No appreciable masses or hepatomegaly.  Rectal:  Not performed.  Msk:  Symmetrical without gross deformities.  Extremities:  Without edema. Neurologic:  Alert and  oriented x4;  grossly normal neurologically. Skin:  Intact without significant lesions or rashes. Psych:  Alert and cooperative. Normal affect.  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 Intolerant of secretions. I discussed food impaction with him, and need for EGD to relieve the obstruction. I discussed risks / benefits of EGD and sedation, including risk for aspiration, esophageal injury / perforation, cardiovascular collapse, etc. Following this he wished to proceed, will plan on EGD at bedside in the EGD with conscious sedation. Further recommendations pending the result.  Barnwell Cellar, MD [facility]

## 2018-10-08 NOTE — ED Notes (Signed)
[employer] called per patient request.

## 2018-10-08 NOTE — ED Triage Notes (Signed)
Pt reports that he had eaten some chicken at 3pm today and food had got stuck in his throat. Pt is able to talk in full sentences, no SOB to report. Pt has had some regurgitation and has had small emesis over 10 x since incident. Pt has reported to have has similar episodes in the past. Denies pain or coughing

## 2018-10-08 NOTE — Interval H&P Note (Signed)
History and Physical Interval Note:  10/08/2018 9:39 PM  Jeffrey Potter  has presented today for surgery, with the diagnosis of food impaction.  The various methods of treatment have been discussed with the patient and family. After consideration of risks, benefits and other options for treatment, the patient has consented to  Procedure(s): ESOPHAGOGASTRODUODENOSCOPY (EGD) (N/A) as a surgical intervention.  The patient's history has been reviewed, patient examined, no change in status, stable for surgery.  I have reviewed the patient's chart and labs.  Questions were answered to the patient's satisfaction.     [place]

## 2018-10-08 NOTE — Consult Note (Signed)
Consultation  Referring Provider:     Carlisle Cater PA Primary Care Physician:  Leanna Battles, MD Primary Gastroenterologist:    Zenovia Jarred Reason for Consultation:     Food impaction         HPI:   Jeffrey Potter is a 58 y.o. 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 He reports he ate chicken around 3 PM, states he ate too fat, one large piece went down and he thinks got stuck. Has not been able to get it to go down or come back up. Glucagon given in the ED without benefit. He denies any chest pain but feels uncomfortable not being able to tolerate secretions, spitting into bag at bedside. No abdominal pain. No shortness of breath or breathing problems. No fevers or recent upper respiratory symptoms.   Last EGD done April 2019, Shatski ring was dilated to 9mm and some inflamed hyperplastic gastric polyps removed. Since his last EGD he has not had much swallowing difficulty at baseline. He thinks this occurred today due to not chewing well, swallowing large piece of chicken.    Past Medical History:  Diagnosis Date  . GERD (gastroesophageal reflux disease)   . High cholesterol   . Hypertension     Past Surgical History:  Procedure Laterality Date  . BALLOON DILATION N/A 10/10/2017   Procedure: BALLOON DILATION;  Surgeon: Jerene Bears, MD;  Location: [facility];  Service: Gastroenterology;  Laterality: N/A;  . ESOPHAGOGASTRODUODENOSCOPY N/A 08/18/2017   Procedure: ESOPHAGOGASTRODUODENOSCOPY (EGD);  Surgeon: Jerene Bears, MD;  Location: [facility];  Service: Gastroenterology;  Laterality: N/A;  . ESOPHAGOGASTRODUODENOSCOPY N/A 09/17/2017   Procedure: ESOPHAGOGASTRODUODENOSCOPY (EGD);  Surgeon: Carol Ada, MD;  Location: [facility];  Service: Endoscopy;  Laterality: N/A;  . ESOPHAGOGASTRODUODENOSCOPY (EGD) WITH PROPOFOL N/A 10/10/2017   Procedure: ESOPHAGOGASTRODUODENOSCOPY (EGD) WITH PROPOFOL;   Surgeon: Jerene Bears, MD;  Location: [facility];  Service: Gastroenterology;  Laterality: N/A;  with biopsy of gastric polyps    Family History  Problem Relation Age of Onset  . Cancer Father      Social History   Tobacco Use  . Smoking status: Never Smoker  . Smokeless tobacco: Never Used  Substance Use Topics  . Alcohol use: Yes    Comment: occasionally  . Drug use: No    Prior to Admission medications   Medication Sig Start Date End Date Taking? Authorizing Provider  amLODipine (NORVASC) 5 MG tablet Take 5 mg by mouth daily.   Yes [provider]  losartan-hydrochlorothiazide (HYZAAR) 100-12.5 MG tablet Take 1 tablet by mouth daily.   Yes [provider]  lovastatin (MEVACOR) 20 MG tablet Take 40 mg by mouth daily.   Yes [provider]  Omega-3 Fatty Acids (FISH OIL) 1000 MG CAPS Take 2,000 mg by mouth daily.   Yes [provider]  omeprazole (PRILOSEC) 20 MG capsule Take 2 capsules (40 mg total) by mouth daily. Patient taking differently: Take 20 mg by mouth daily.  08/18/17  Yes Charlesetta Shanks, MD    No current facility-administered medications for this encounter.    Current Outpatient Medications  Medication Sig Dispense Refill  . amLODipine (NORVASC) 5 MG tablet Take 5 mg by mouth daily.    Marland Kitchen losartan-hydrochlorothiazide (HYZAAR) 100-12.5 MG tablet Take 1 tablet by mouth daily.    Marland Kitchen lovastatin (MEVACOR) 20 MG tablet Take 40 mg by  mouth daily.    . Omega-3 Fatty Acids (FISH OIL) 1000 MG CAPS Take 2,000 mg by mouth daily.    Marland Kitchen omeprazole (PRILOSEC) 20 MG capsule Take 2 capsules (40 mg total) by mouth daily. (Patient taking differently: Take 20 mg by mouth daily. ) 60 capsule 0    Allergies as of 10/08/2018  . (No Known Allergies)     Review of Systems:    As per HPI, otherwise negative    Physical Exam:  Vital signs in last 24 hours: Temp:  [98.8 F (37.1 C)] 98.8 F (37.1 C) (04/27 1941) Pulse Rate:  [94] 94 (04/27  1941) Resp:  [16] 16 (04/27 1941) BP: (136)/(102) 136/102 (04/27 1941) SpO2:  [95 %] 95 % (04/27 1941) Weight:  [106.5 kg] 106.5 kg (04/27 1941)   General:   Pleasant male in NAD Head:  Normocephalic and atraumatic. Eyes:   No icterus.   Conjunctiva pink. Ears:  Normal auditory acuity. Neck:  Supple Lungs:  Respirations even and unlabored. Lungs clear to auscultation bilaterally.   No wheezes, crackles, or rhonchi.  Heart:  Regular rate and rhythm; no MRG Abdomen:  Soft, nondistended, nontender. No appreciable masses or hepatomegaly.  Rectal:  Not performed.  Msk:  Symmetrical without gross deformities.  Extremities:  Without edema. Neurologic:  Alert and  oriented x4;  grossly normal neurologically. Skin:  Intact without significant lesions or rashes. Psych:  Alert and cooperative. Normal affect.  No results found for: WBC, HGB, HCT, MCV, PLT    Impression / Plan:   58 y/o male with a history of food impactions secondary to distal esophageal Shatski ring s/p dilation to 39mm in April 2019, here in the ED with a recurrent food impaction. Intolerant of secretions. I discussed food impaction with him, and need for EGD to relieve the obstruction. I discussed risks / benefits of EGD and sedation, including risk for aspiration, esophageal injury / perforation, cardiovascular collapse, etc. Following this he wished to proceed, will plan on EGD at bedside in the EGD with conscious sedation. Further recommendations pending the result.  [place] Cellar, MD [facility]

## 2018-10-10 ENCOUNTER — Telehealth: Payer: Self-pay | Admitting: *Deleted

## 2018-10-10 NOTE — Telephone Encounter (Signed)
See 10/08/18 ER visit note as well as EGD done that date. I have left message for patient to call back.  ===View-only below this line=== ----- Message ----- From: Jerene Bears, MD Sent: 10/09/2018  10:03 AM EDT To: Larina Bras, CMA  Pt needs [facility] EGD for dilation May or June 2020 ----- Message ----- From: Geannie Risen, RN Sent: 10/09/2018  12:14 AM EDT To: Jerene Bears, MD

## 2018-10-11 ENCOUNTER — Encounter (HOSPITAL_COMMUNITY): Payer: Self-pay | Admitting: Gastroenterology

## 2018-10-15 NOTE — Telephone Encounter (Signed)
I have left a voicemail for patient to call back. 

## 2018-10-17 NOTE — Telephone Encounter (Signed)
Left voicemail for patient to call back. 

## 2018-10-18 NOTE — Telephone Encounter (Signed)
I have made 3 attempts to reach patient and have not received a return call. Will await a call for back at which time we can give Dr Vena Rua recommendations.

## 2019-08-08 IMAGING — RF DG BE W/ AIR HIGH DENSITY
10 series · 10 of 10 positions shown · non-contrast
Comparison: None.

CLINICAL DATA: Heme-positive stool.

EXAM:
AIR CONTRAST BARIUM ENEMA
TECHNIQUE: Initial scout AP supine abdominal image obtained to insure adequate
colon cleansing. Barium was introduced into the colon in a
retrograde fashion and refluxed from the rectum to the distal
transverse colon. As much of the barium as possible was then removed
through the indwelling tube via gravity drain. Air was then
insufflated into the colon. Spot images of the colon followed by
overhead radiographs were obtained.
FLUOROSCOPY TIME:  Fluoroscopy Time: 1 minutes and 18 seconds of
low-dose pulsed fluoro
Radiation Exposure Index (if provided by the fluoroscopic device):
122 mGy
Number of Acquired Spot Images: 1 scout image. Sixteen overhead
radiographs.

[Series 1: t abdomen supine · 0.15mm/px · 1 of 1 slices shown]
[im 1/1]
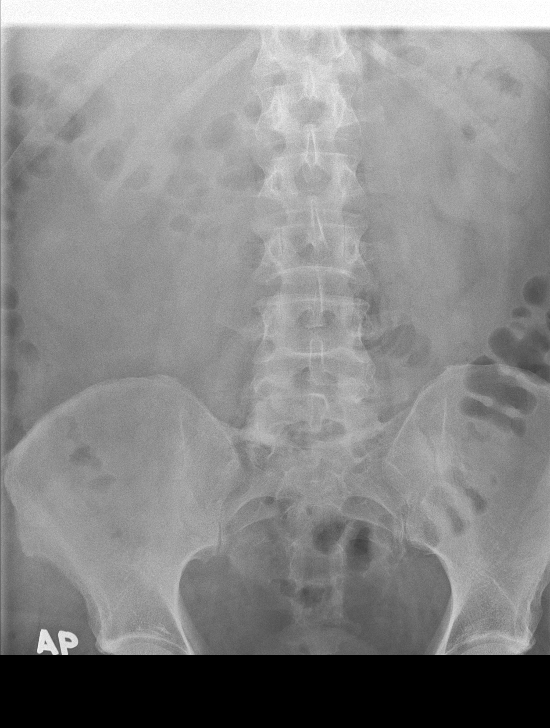

[Series 2: cp_standard · 0.27mm/px · 1 of 1 slices shown (1 of 9)]
[im 1/1]
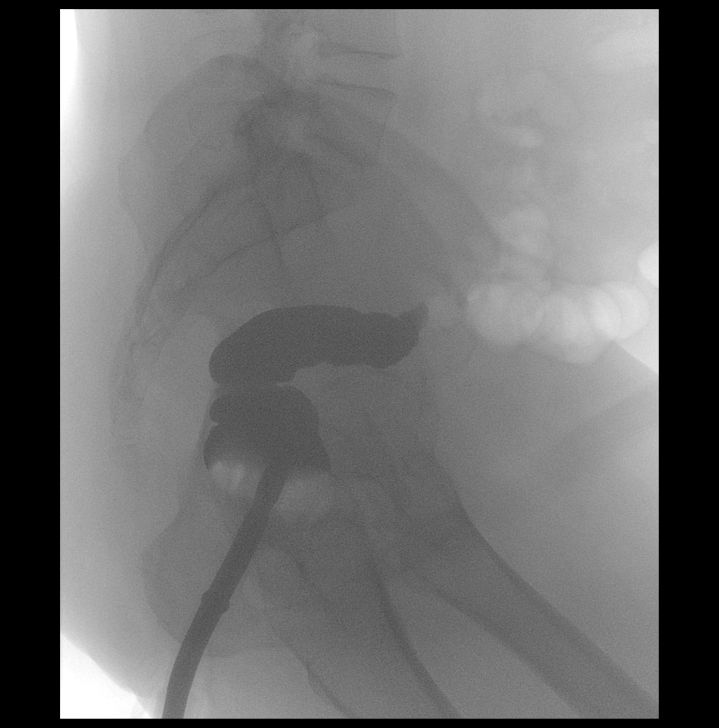

[Series 3: cp_standard · 0.28mm/px · 1 of 1 slices shown (2 of 9)]
[im 1/1]
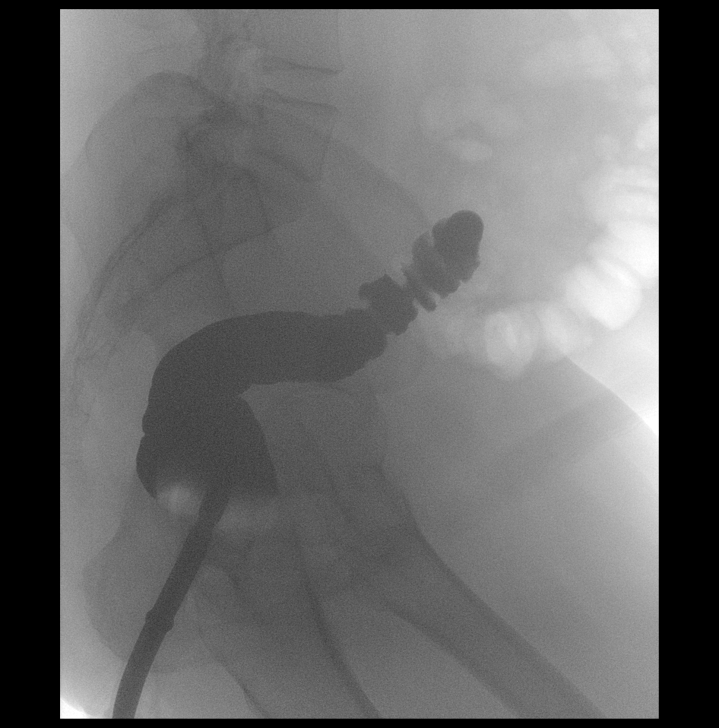

[Series 4: cp_standard · 0.28mm/px · 1 of 1 slices shown (3 of 9)]
[im 1/1]
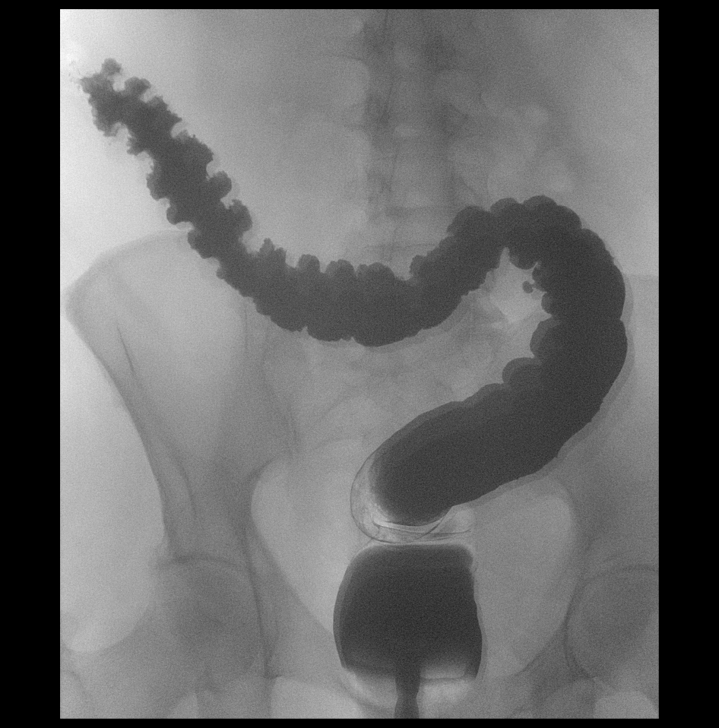

[Series 5: cp_standard · 0.28mm/px · 1 of 1 slices shown (4 of 9)]
[im 1/1]
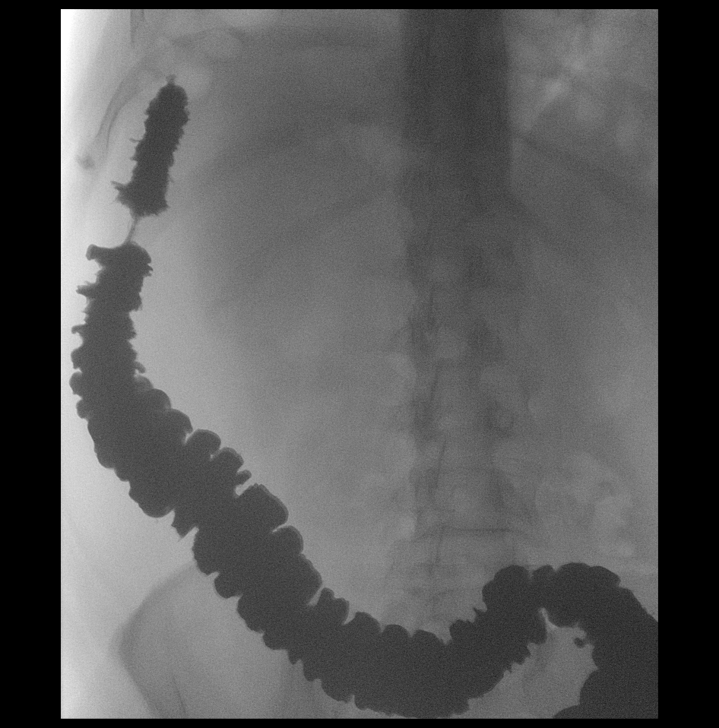

[Series 6: cp_standard · 0.30mm/px · 1 of 1 slices shown (5 of 9)]
[im 1/1]
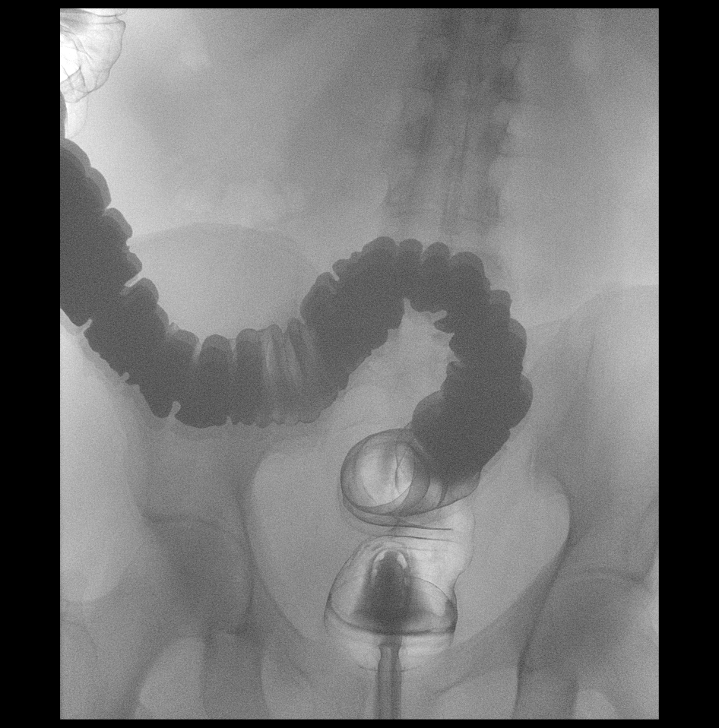

[Series 7: cp_standard · 0.20mm/px · 1 of 1 slices shown (6 of 9)]
[im 1/1]
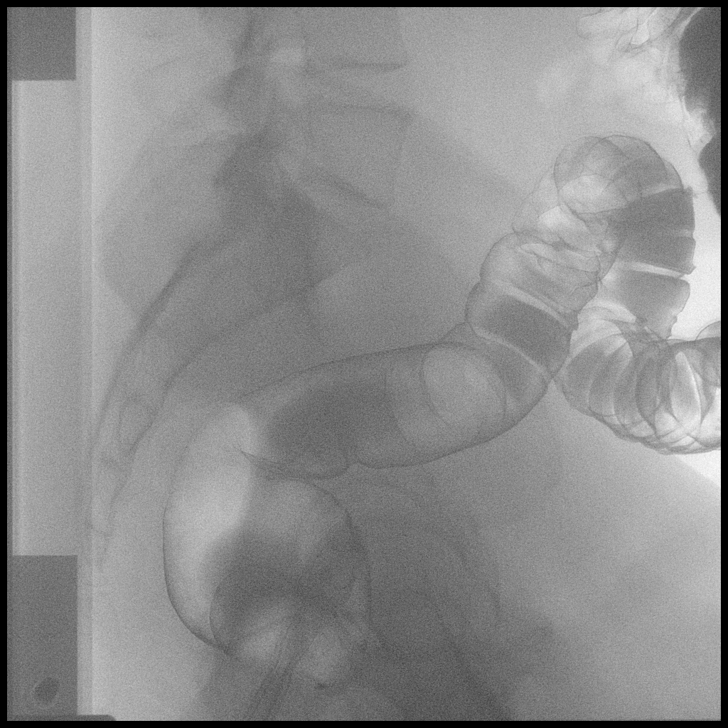

[Series 8: cp_standard · 0.20mm/px · 1 of 1 slices shown (7 of 9)]
[im 1/1]
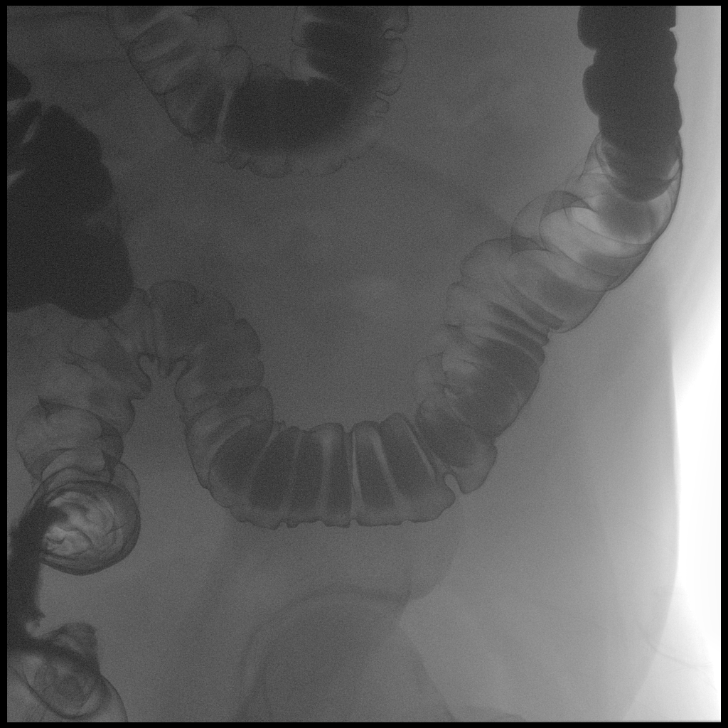

[Series 9: cp_standard · 0.19mm/px · 1 of 1 slices shown (8 of 9)]
[im 1/1]
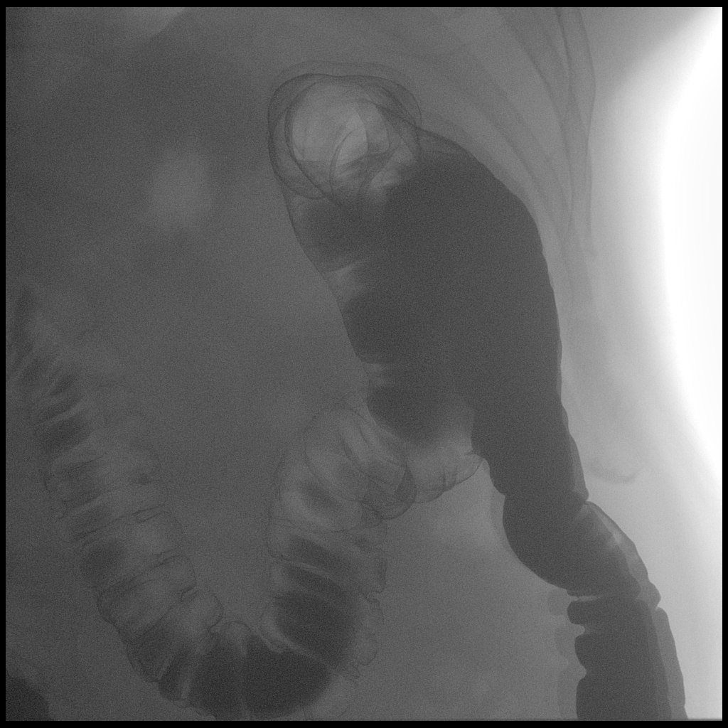

[Series 10: cp_standard · 0.20mm/px · 1 of 1 slices shown (9 of 9)]
[im 1/1]
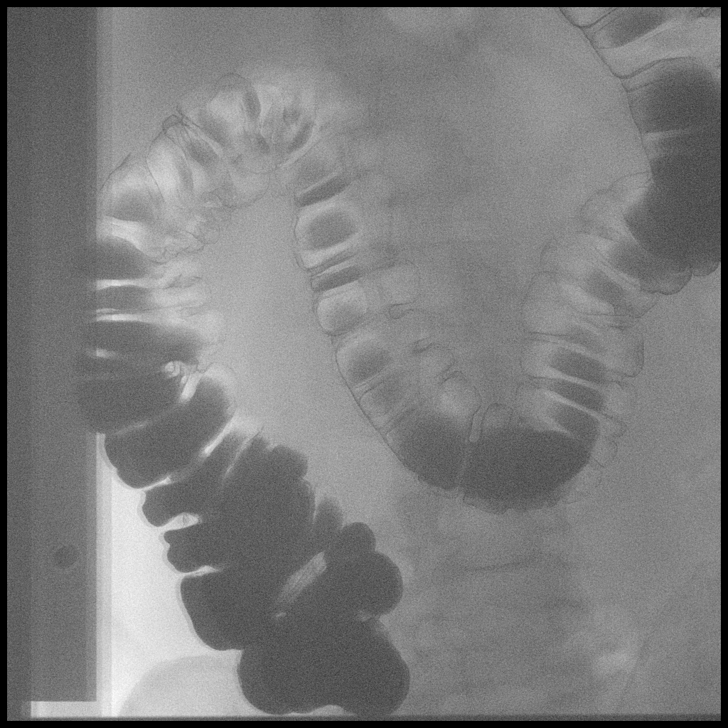

[10 of 10 positions shown; findings below may reference images not displayed]

FINDINGS: The scout abdominal radiograph is normal.

There is a probable 18 mm polyp in the mid ascending colon, not seen
during the fluoroscopic portion of the examination. No other polyps
are identified. There is minimal diverticulosis of the sigmoid
colon. The appendix is partially opacified and appears normal. No
reflux of the terminal ileum demonstrated.
IMPRESSION: 1. Right colonic polyp. Further evaluation with colonoscopy
recommended.
2. Minimal sigmoid diverticulosis.

## 2019-09-07 ENCOUNTER — Emergency Department (HOSPITAL_COMMUNITY)
Admission: EM | Admit: 2019-09-07 | Discharge: 2019-09-08 | Disposition: A | Discharge status: Home / Self Care | Payer: Self-pay | Attending: Emergency Medicine | Admitting: Emergency Medicine

## 2019-09-07 ENCOUNTER — Encounter (HOSPITAL_COMMUNITY): Payer: Self-pay

## 2019-09-07 ENCOUNTER — Other Ambulatory Visit: Payer: Self-pay

## 2019-09-07 DIAGNOSIS — Y939 Activity, unspecified: Secondary | ICD-10-CM | POA: Insufficient documentation

## 2019-09-07 DIAGNOSIS — Z20822 Contact with and (suspected) exposure to covid-19: Secondary | ICD-10-CM | POA: Insufficient documentation

## 2019-09-07 DIAGNOSIS — Y999 Unspecified external cause status: Secondary | ICD-10-CM | POA: Insufficient documentation

## 2019-09-07 DIAGNOSIS — I10 Essential (primary) hypertension: Secondary | ICD-10-CM | POA: Insufficient documentation

## 2019-09-07 DIAGNOSIS — X58XXXA Exposure to other specified factors, initial encounter: Secondary | ICD-10-CM | POA: Insufficient documentation

## 2019-09-07 DIAGNOSIS — Z79899 Other long term (current) drug therapy: Secondary | ICD-10-CM | POA: Insufficient documentation

## 2019-09-07 DIAGNOSIS — T18128A Food in esophagus causing other injury, initial encounter: Secondary | ICD-10-CM | POA: Insufficient documentation

## 2019-09-07 DIAGNOSIS — Y929 Unspecified place or not applicable: Secondary | ICD-10-CM | POA: Insufficient documentation

## 2019-09-07 MED ORDER — LORAZEPAM 2 MG/ML IJ SOLN
0.5000 mg | Freq: Once | INTRAMUSCULAR | Status: AC
  Administered 2019-09-07: 23:00:00 0.5 mg via INTRAVENOUS
  Filled 2019-09-07: qty 1

## 2019-09-07 MED ORDER — GLUCAGON HCL RDNA (DIAGNOSTIC) 1 MG IJ SOLR
1.0000 mg | Freq: Once | INTRAMUSCULAR | Status: AC
  Administered 2019-09-08: 1 mg via INTRAVENOUS
  Filled 2019-09-07: qty 1

## 2019-09-07 MED ORDER — GLUCAGON HCL RDNA (DIAGNOSTIC) 1 MG IJ SOLR: 1.0000 mg | Freq: Once | INTRAMUSCULAR | Status: DC | PRN

## 2019-09-07 NOTE — ED Provider Notes (Signed)
[facility] DEPT Provider Note   CSN: [id number] Arrival date & time: 09/07/19  2146     History Chief Complaint  Patient presents with  . FOOD BOLUS    Jeffrey Potter is a 59 y.o. male.  The history is provided by the patient and medical records.   59 y.o. M with hx of GERD, HLP, HTN, hx of schatzki's ring requiring dilation (most recent was about 2 years ago) presenting to the ED with food bolus.  States he was eating pork around 9PM and feels like a large chunk of it is stuck.  He has not been able to get down any further food or drink since this time.  He does report feeling the urge to spit.  He does report history of food impaction multiple times in the past, most of which required EGD.  No meds tried PTA. He is followed by GI, Dr. Hilarie Fredrickson.  Past Medical History:  Diagnosis Date  . GERD (gastroesophageal reflux disease)   . High cholesterol   . Hypertension     Patient Active Problem List   Diagnosis Date Noted  . Gastric polyps   . Gastritis without bleeding   . Nodularity of gastric antrum   . Esophageal dysphagia   . Food impaction of esophagus   . Acute esophageal obstruction   . Schatzki's ring     Past Surgical History:  Procedure Laterality Date  . BALLOON DILATION N/A 10/10/2017   Procedure: BALLOON DILATION;  Surgeon: Jerene Bears, MD;  Location: [facility];  Service: Gastroenterology;  Laterality: N/A;  . ESOPHAGOGASTRODUODENOSCOPY N/A 08/18/2017   Procedure: ESOPHAGOGASTRODUODENOSCOPY (EGD);  Surgeon: Jerene Bears, MD;  Location: [facility];  Service: Gastroenterology;  Laterality: N/A;  . ESOPHAGOGASTRODUODENOSCOPY N/A 09/17/2017   Procedure: ESOPHAGOGASTRODUODENOSCOPY (EGD);  Surgeon: Carol Ada, MD;  Location: [facility];  Service: Endoscopy;  Laterality: N/A;  . ESOPHAGOGASTRODUODENOSCOPY N/A 10/08/2018   Procedure: ESOPHAGOGASTRODUODENOSCOPY (EGD);  Surgeon: Yetta Flock, MD;  Location: [facility];   Service: Gastroenterology;  Laterality: N/A;  . ESOPHAGOGASTRODUODENOSCOPY (EGD) WITH PROPOFOL N/A 10/10/2017   Procedure: ESOPHAGOGASTRODUODENOSCOPY (EGD) WITH PROPOFOL;  Surgeon: Jerene Bears, MD;  Location: [facility];  Service: Gastroenterology;  Laterality: N/A;  with biopsy of gastric polyps  . FOREIGN BODY REMOVAL  10/08/2018   Procedure: FOREIGN BODY REMOVAL;  Surgeon: Yetta Flock, MD;  Location: [facility];  Service: Gastroenterology;;  Food Impaction       Family History  Problem Relation Age of Onset  . Cancer Father     Social History   Tobacco Use  . Smoking status: Never Smoker  . Smokeless tobacco: Never Used  Substance Use Topics  . Alcohol use: Yes    Comment: occasionally  . Drug use: No    Home Medications Prior to Admission medications   Medication Sig Start Date End Date Taking? Authorizing Provider  amLODipine (NORVASC) 5 MG tablet Take 5 mg by mouth daily.    [provider]  losartan-hydrochlorothiazide (HYZAAR) 100-12.5 MG tablet Take 1 tablet by mouth daily.    [provider]  lovastatin (MEVACOR) 20 MG tablet Take 40 mg by mouth daily.    [provider]  Omega-3 Fatty Acids (FISH OIL) 1000 MG CAPS Take 2,000 mg by mouth daily.    [provider]  omeprazole (PRILOSEC) 20 MG capsule Take 2 capsules (40 mg total) by mouth daily. Patient taking differently: Take 20 mg by mouth daily.  08/18/17   Pfeiffer,  Jeannie Done, MD    Allergies    Patient has no known allergies.  Review of Systems   Review of Systems  HENT: Positive for trouble swallowing.   Gastrointestinal: Positive for vomiting.  All other systems reviewed and are negative.   Physical Exam Updated Vital Signs BP (!) 121/91 (BP Location: Left Arm)   Pulse 91   Temp 99 F (37.2 C) (Oral)   Resp 16   Ht 5\' 10"  (1.778 m)   Wt 99.8 kg   SpO2 98%   BMI 31.57 kg/m   Physical Exam Vitals and nursing note reviewed.  Constitutional:       Appearance: He is well-developed.     Comments: Spitting/vomiting into trash can at bedside  HENT:     Head: Normocephalic and atraumatic.     Mouth/Throat:     Comments: No stridor or drooling, is able to speak in sentences, normal phonation Eyes:     Conjunctiva/sclera: Conjunctivae normal.     Pupils: Pupils are equal, round, and reactive to light.  Cardiovascular:     Rate and Rhythm: Normal rate and regular rhythm.     Heart sounds: Normal heart sounds.  Pulmonary:     Effort: Pulmonary effort is normal.     Breath sounds: Normal breath sounds.  Abdominal:     General: Bowel sounds are normal.     Palpations: Abdomen is soft.  Musculoskeletal:        General: Normal range of motion.     Cervical back: Normal range of motion.  Skin:    General: Skin is warm and dry.  Neurological:     Mental Status: He is alert and oriented to person, place, and time.     ED Results / Procedures / Treatments   Labs (all labs ordered are listed, but only abnormal results are displayed) Labs Reviewed  RESPIRATORY PANEL BY RT PCR (FLU A&B, COVID)    EKG None  Radiology No results found.  Procedures Procedures (including critical care time)  Medications Ordered in ED Medications  LORazepam (ATIVAN) injection 0.5 mg (0.5 mg Intravenous Given 09/07/19 2305)  glucagon (human recombinant) (GLUCAGEN) injection 1 mg (1 mg Intravenous Given 09/08/19 0010)    ED Course  I have reviewed the triage vital signs and the nursing notes.  Pertinent labs & imaging results that were available during my care of the patient were reviewed by me and considered in my medical decision making (see chart for details).    MDM Rules/Calculators/A&P  59 y.o. M here with food bolus after eating pork at 9PM.  Has been unable to eat/drink since this time.  On my exam, he is actively spitting/vomiting into trash can at bedside but has no stridor and is able to speak in sentences without difficulty.  No  drooling.  VSS.  Hx of food impaction multiple times in the past, most requiring EGD.  Will attempt trial of glucagon and ativan as this did work once before.    12:25 AM Food bolus unchanged with glucagon and ativan.  Patient without any further vomiting though.  Will consult GI.  COVID screen will be sent.  12:35 AM Discussed with GI, Dr. Henrene Pastor--- EGD to be done first thing in the AM.  Can hold in the ED as he can likely be discharged from here after procedure.  COVID screen is negative. Patient remains stable, will continue to monitor.  6:23 AM Patient has been stable overnight.  VS remain stable.  Awaiting GI  for EGD.  Care will be signed out to oncoming provider.  I anticipate if feeling better after EGD and tolerating PO, he can likely be discharged home with OP follow-up.  Final Clinical Impression(s) / ED Diagnoses Final diagnoses:  Food impaction of esophagus, initial encounter    Rx / DC Orders ED Discharge Orders    None       Larene Pickett, PA-C 09/08/19 [id number]    Veryl Speak, MD 09/08/19 2300

## 2019-09-07 NOTE — ED Triage Notes (Signed)
Arrived POV from home. Patient reports he was he has food (pork) stuck in his throat; he reports this has happened to him before. Denies SHOB.

## 2019-09-08 ENCOUNTER — Emergency Department (HOSPITAL_COMMUNITY): Payer: Self-pay | Admitting: Certified Registered Nurse Anesthetist

## 2019-09-08 ENCOUNTER — Encounter (HOSPITAL_COMMUNITY): Payer: Self-pay | Admitting: *Deleted

## 2019-09-08 ENCOUNTER — Encounter (HOSPITAL_COMMUNITY)
Admission: EM | Disposition: A | Discharge status: Home / Self Care | Payer: Self-pay | Source: Home / Self Care | Attending: Emergency Medicine

## 2019-09-08 DIAGNOSIS — R1310 Dysphagia, unspecified: Secondary | ICD-10-CM

## 2019-09-08 DIAGNOSIS — K222 Esophageal obstruction: Secondary | ICD-10-CM

## 2019-09-08 DIAGNOSIS — T18128A Food in esophagus causing other injury, initial encounter: Secondary | ICD-10-CM

## 2019-09-08 DIAGNOSIS — K2100 Gastro-esophageal reflux disease with esophagitis, without bleeding: Secondary | ICD-10-CM

## 2019-09-08 HISTORY — PX: ESOPHAGOGASTRODUODENOSCOPY: SHX5428

## 2019-09-08 HISTORY — PX: FOREIGN BODY REMOVAL: SHX962

## 2019-09-08 LAB — RESPIRATORY PANEL BY RT PCR (FLU A&B, COVID)
Influenza A by PCR: NEGATIVE
Influenza B by PCR: NEGATIVE
SARS Coronavirus 2 by RT PCR: NEGATIVE

## 2019-09-08 SURGERY — EGD (ESOPHAGOGASTRODUODENOSCOPY)
Anesthesia: Monitor Anesthesia Care

## 2019-09-08 MED ORDER — LACTATED RINGERS IV SOLN: INTRAVENOUS | Status: DC | PRN

## 2019-09-08 MED ORDER — PROPOFOL 500 MG/50ML IV EMUL
INTRAVENOUS | Status: AC
  Filled 2019-09-08: qty 50

## 2019-09-08 MED ORDER — PROPOFOL 500 MG/50ML IV EMUL
INTRAVENOUS | Status: DC | PRN
  Administered 2019-09-08: 150 ug/kg/min via INTRAVENOUS

## 2019-09-08 MED ORDER — PROPOFOL 10 MG/ML IV BOLUS
INTRAVENOUS | Status: DC | PRN
  Administered 2019-09-08 (×3): 30 mg via INTRAVENOUS

## 2019-09-08 NOTE — ED Notes (Signed)
Patient drinking water without diffiuclty

## 2019-09-08 NOTE — H&P (Signed)
HISTORY OF PRESENT ILLNESS:  Jeffrey Potter is a 59 y.o. male with a history of GERD complicated by peptic stricture.  I am consulted by the emergency room physician regarding acute food impaction.  He has had recurrent food impactions requiring endoscopic attention.  Last such episode April 2020.  He was advised to follow-up with his primary gastroenterologist for routine outpatient esophageal dilation.  He did not.  He presented to the emergency room late last evening after consuming pork product late last evening.  Subsequent impaction.  He received glucagon and Ativan in the emergency room.  He was stable without distress overnight and is now for upper endoscopy and removal of acute food impaction, this morning.  Patient has no other complaints such as difficulty breathing or chest pain.  REVIEW OF SYSTEMS:  All non-GI ROS negative except for  Past Medical History:  Diagnosis Date  . GERD (gastroesophageal reflux disease)   . High cholesterol   . Hypertension     Past Surgical History:  Procedure Laterality Date  . BALLOON DILATION N/A 10/10/2017   Procedure: BALLOON DILATION;  Surgeon: Jerene Bears, MD;  Location: [facility];  Service: Gastroenterology;  Laterality: N/A;  . ESOPHAGOGASTRODUODENOSCOPY N/A 08/18/2017   Procedure: ESOPHAGOGASTRODUODENOSCOPY (EGD);  Surgeon: Jerene Bears, MD;  Location: [facility];  Service: Gastroenterology;  Laterality: N/A;  . ESOPHAGOGASTRODUODENOSCOPY N/A 09/17/2017   Procedure: ESOPHAGOGASTRODUODENOSCOPY (EGD);  Surgeon: Carol Ada, MD;  Location: [facility];  Service: Endoscopy;  Laterality: N/A;  . ESOPHAGOGASTRODUODENOSCOPY N/A 10/08/2018   Procedure: ESOPHAGOGASTRODUODENOSCOPY (EGD);  Surgeon: Yetta Flock, MD;  Location: [facility];  Service: Gastroenterology;  Laterality: N/A;  . ESOPHAGOGASTRODUODENOSCOPY (EGD) WITH PROPOFOL N/A 10/10/2017   Procedure: ESOPHAGOGASTRODUODENOSCOPY (EGD) WITH PROPOFOL;  Surgeon: Jerene Bears, MD;   Location: [facility];  Service: Gastroenterology;  Laterality: N/A;  with biopsy of gastric polyps  . FOREIGN BODY REMOVAL  10/08/2018   Procedure: FOREIGN BODY REMOVAL;  Surgeon: Yetta Flock, MD;  Location: [facility];  Service: Gastroenterology;;  Food Impaction    Social History [name]  reports that he has never smoked. He has never used smokeless tobacco. He reports current alcohol use. He reports that he does not use drugs.  family history includes Cancer in his father.  No Known Allergies     PHYSICAL EXAMINATION: Vital signs: BP (!) 180/89   Pulse 60   Temp 98.8 F (37.1 C) (Oral)   Resp 18   Ht 5\' 10"  (1.778 m)   Wt 104.3 kg   SpO2 95%   BMI 33.00 kg/m   Constitutional: generally well-appearing, no acute distress Psychiatric: alert and oriented x3, cooperative Eyes: extraocular movements intact, anicteric, conjunctiva pink Mouth: oral pharynx moist, no lesions Neck: supple no lymphadenopathy Cardiovascular: heart regular rate and rhythm, no murmur Lungs: clear to auscultation bilaterally Abdomen: soft, nontender, nondistended, no obvious ascites, no peritoneal signs, normal bowel sounds, no organomegaly Rectal: Omitted Extremities: no clubbing, cyanosis, or lower extremity edema bilaterally Skin: no lesions on visible extremities Neuro: No focal deficits.  Cranial nerves intact  ASSESSMENT:  1.  GERD with esophageal stricture 2.  Dysphagia 3.  Acute food impaction   PLAN:  1.  Urgent upper endoscopy.The nature of the procedure, as well as the risks, benefits, and alternatives were carefully and thoroughly reviewed with the patient. Ample time for discussion and questions allowed. The patient understood, was satisfied, and agreed to proceed.  Docia Chuck. Geri Seminole., M.D. [facility] Division of Gastroenterology

## 2019-09-08 NOTE — ED Notes (Signed)
Patient has returned from Endoscopy. Patient alert and oriented and is requesting water

## 2019-09-08 NOTE — Anesthesia Preprocedure Evaluation (Signed)
Anesthesia Evaluation  Patient identified by MRN, date of birth, ID band Patient awake    Reviewed: Allergy & Precautions, H&P , NPO status , Patient's Chart, lab work & pertinent test results  Airway Mallampati: III  TM Distance: >3 FB Neck ROM: Full    Dental no notable dental hx. (+) Teeth Intact, Dental Advisory Given   Pulmonary neg pulmonary ROS,    Pulmonary exam normal breath sounds clear to auscultation       Cardiovascular hypertension, Pt. on medications Normal cardiovascular exam Rhythm:Regular Rate:Normal     Neuro/Psych negative neurological ROS  negative psych ROS   GI/Hepatic Neg liver ROS, GERD  Medicated and Controlled,  Endo/Other  negative endocrine ROS  Renal/GU negative Renal ROS  negative genitourinary   Musculoskeletal negative musculoskeletal ROS (+)   Abdominal (+) + obese,   Peds  Hematology   Anesthesia Other Findings   Reproductive/Obstetrics negative OB ROS                             Anesthesia Physical  Anesthesia Plan  ASA: II  Anesthesia Plan: MAC   Post-op Pain Management:    Induction: Intravenous  PONV Risk Score and Plan: 1 and Propofol infusion  Airway Management Planned: Nasal Cannula and Mask  Additional Equipment: None  Intra-op Plan:   Post-operative Plan:   Informed Consent: I have reviewed the patients History and Physical, chart, labs and discussed the procedure including the risks, benefits and alternatives for the proposed anesthesia with the patient or authorized representative who has indicated his/her understanding and acceptance.     Dental advisory given  Plan Discussed with: CRNA  Anesthesia Plan Comments:         Anesthesia Quick Evaluation

## 2019-09-08 NOTE — ED Provider Notes (Signed)
Care assumed from Quincy Carnes, PA-C, at shift change, please see their notes for full documentation of patient's complaint/HPI. Briefly, pt here with food bolus after eating pork chop around 9 PM last night. Failed glucagon and ativan in the ED. Plan is for EGD this morning and to likely be discharged from ED afterwards.   Physical Exam  BP (!) 157/106   Pulse 65   Temp 98.2 F (36.8 C) (Oral)   Resp 14   Ht 5\' 10"  (1.778 m)   Wt 104.3 kg   SpO2 92%   BMI 33.00 kg/m   Physical Exam  ED Course/Procedures     Procedures  MDM  Received call from Dr. Henrene Pastor checking on pt status around 7 AM - still complaining of food impaction. He will be taken to the endoscopy suite.   EGD performed without difficulty and food bolus removed. Pt returned to the ED. Asking for water. Able to drink water without difficulty. Vitals are stable. He is hemodynamically stable. Received the all clear from gastroenterology that pt is stable for discharge home. He is to follow up with GI outpatient.        Jeffrey Maize, PA-C 09/08/19 1229    Veryl Speak, MD 09/08/19 2300

## 2019-09-08 NOTE — Anesthesia Postprocedure Evaluation (Signed)
Anesthesia Post Note  Patient: Jeffrey Potter  Procedure(s) Performed: ESOPHAGOGASTRODUODENOSCOPY (EGD) (N/A ) FOREIGN BODY REMOVAL     Patient location during evaluation: Endoscopy Anesthesia Type: MAC Level of consciousness: awake Pain management: pain level controlled Vital Signs Assessment: post-procedure vital signs reviewed and stable Respiratory status: spontaneous breathing Cardiovascular status: stable Postop Assessment: no apparent nausea or vomiting Anesthetic complications: no    Last Vitals:  Vitals:   09/08/19 1025 09/08/19 1030  BP: (!) 142/91 (!) 146/84  Pulse: 72 72  Resp: 18 17  Temp: 36.6 C   SpO2: 98% 98%    Last Pain:  Vitals:   09/08/19 1025  TempSrc: Oral  PainSc: 0-No pain   Pain Goal:                   Huston Foley

## 2019-09-08 NOTE — Progress Notes (Signed)
In pre-procedure,  Patient stated his mother would be able to come pick him up following EGD. In post-procedure patient stated mother does not have caregiver with her today and can not drive herself to come pick him up. Call placed to MDA Hatchett, he states patient may leave via cab after 1 hour. At this time patient meets endo recovery discharge criteria, he will return to ED. ED RN advised that patient may be discharged and leave via cab after 1200.

## 2019-09-08 NOTE — Op Note (Signed)
[facility] Patient Name: Jeffrey Potter Procedure Date: 09/08/2019 MRN: [id number] Attending MD: Docia Chuck. Henrene Pastor , MD Date of Birth: [birth year] CSN: [id number] Age: 59 Admit Type: Outpatient Procedure:                Upper GI endoscopy with foreign body removal Indications:              Foreign body in the esophagus. Pork impaction late                            last evening. History of recurrent food impaction                            requiring endoscopic attention Providers:                Docia Chuck. Henrene Pastor, MD, Carlyn Reichert, RN, Janeece Agee,                            Technician Referring MD:             Emergency room physician Medicines:                Monitored Anesthesia Care Complications:            No immediate complications. Estimated Blood Loss:     Estimated blood loss: none. Procedure:                Pre-Anesthesia Assessment:                           - Prior to the procedure, a History and Physical                            was performed, and patient medications and                            allergies were reviewed. The patient's tolerance of                            previous anesthesia was also reviewed. The risks                            and benefits of the procedure and the sedation                            options and risks were discussed with the patient.                            All questions were answered, and informed consent                            was obtained. Prior Anticoagulants: The patient has                            taken no previous anticoagulant or antiplatelet  agents. ASA Grade Assessment: II - A patient with                            mild systemic disease. After reviewing the risks                            and benefits, the patient was deemed in                            satisfactory condition to undergo the procedure.                           After obtaining informed consent, the endoscope  was                            passed under direct vision. Throughout the                            procedure, the patient's blood pressure, pulse, and                            oxygen saturations were monitored continuously. The                            Endoscope was introduced through the mouth, and                            advanced to the second part of duodenum. The upper                            GI endoscopy was accomplished without difficulty.                            The patient tolerated the procedure well. Scope In: Scope Out: Findings:      Food was found in the lower third of the esophagus. Removal of food was       accomplished by gently advancing the food bolus into the stomach. There       was an underlying ringlike stricture.      The stomach revealed a sliding hiatal hernia at least 1       inflammatory-looking polyp. Examination was somewhat compromised by the       presence of fluid and food.      The examined duodenum was normal.      The cardia and gastric fundus were normal on retroflexion. Impression:               1. Acute food impaction status post endoscopic                            removal                           2. GERD with underlying stricture and hiatal hernia  3. Incidental inflammatory appearing polyp(s),                            otherwise normal EGD. Moderate Sedation:      none Recommendation:           1. Liquids and soft foods the next 24 hours                           2. Continue omeprazole 40 mg daily                           3. Contact our office to arrange outpatient                            esophageal dilation with Dr. Hilarie Fredrickson soon as                            possible, to avoid future food impactions.                           4. Chew food extremely well in the interim Procedure Code(s):        --- Professional ---                           [phone], Esophagogastroduodenoscopy, flexible,                             transoral; with removal of foreign body(s) Diagnosis Code(s):        --- Professional ---                           [id number], Food in esophagus causing other injury,                            initial encounter                           T18.108A, Unspecified foreign body in esophagus                            causing other injury, initial encounter CPT copyright 2019 American Medical Association. All rights reserved. The codes documented in this report are preliminary and upon coder review may  be revised to meet current compliance requirements. Docia Chuck. Henrene Pastor, MD 09/08/2019 10:28:56 AM This report has been signed electronically. Number of Addenda: 0

## 2019-09-08 NOTE — Transfer of Care (Signed)
Immediate Anesthesia Transfer of Care Note  Patient: Jeffrey Potter  Procedure(s) Performed: ESOPHAGOGASTRODUODENOSCOPY (EGD) (N/A ) FOREIGN BODY REMOVAL  Patient Location: Endoscopy Unit  Anesthesia Type:MAC  Level of Consciousness: awake, alert , oriented and patient cooperative  Airway & Oxygen Therapy: Patient Spontanous Breathing and Patient connected to face mask  Post-op Assessment: Report given to RN and Post -op Vital signs reviewed and stable  Post vital signs: Reviewed and stable  Last Vitals:  Vitals Value Taken Time  BP    Temp    Pulse    Resp    SpO2      Last Pain:  Vitals:   09/08/19 0915  TempSrc: Oral  PainSc: 0-No pain         Complications: No apparent anesthesia complications

## 2019-09-08 NOTE — Discharge Instructions (Signed)
YOU HAD AN ENDOSCOPIC PROCEDURE TODAY: Refer to the procedure report and other information in the discharge instructions given to you for any specific questions about what was found during the examination. If this information does not answer your questions, please call [place] office at [phone] to clarify.   YOU SHOULD EXPECT: Some feelings of bloating in the abdomen. Passage of more gas than usual. Walking can help get rid of the air that was put into your GI tract during the procedure and reduce the bloating. If you had a lower endoscopy (such as a colonoscopy or flexible sigmoidoscopy) you may notice spotting of blood in your stool or on the toilet paper. Some abdominal soreness may be present for a day or two, also.  DIET: Your first meal following the procedure should be a light meal and then it is ok to progress to your normal diet. A half-sandwich or bowl of soup is an example of a good first meal. Heavy or fried foods are harder to digest and may make you feel nauseous or bloated. Drink plenty of fluids but you should avoid alcoholic beverages for 24 hours. If you had a esophageal dilation, please see attached instructions for diet.    ACTIVITY: Your care partner should take you home directly after the procedure. You should plan to take it easy, moving slowly for the rest of the day. You can resume normal activity the day after the procedure however YOU SHOULD NOT DRIVE, use power tools, machinery or perform tasks that involve climbing or major physical exertion for 24 hours (because of the sedation medicines used during the test).   SYMPTOMS TO REPORT IMMEDIATELY: A gastroenterologist can be reached at any hour. Please call [phone]  for any of the following symptoms:   Following upper endoscopy (EGD, EUS, ERCP, esophageal dilation) Vomiting of blood or coffee ground material  New, significant abdominal pain  New, significant chest pain or pain under the shoulder blades  Painful or  persistently difficult swallowing  New shortness of breath  Black, tarry-looking or red, bloody stools  FOLLOW UP:  If any biopsies were taken you will be contacted by phone or by letter within the next 1-3 weeks. Call [phone]  if you have not heard about the biopsies in 3 weeks.  Please also call with any specific questions about appointments or follow up tests.  

## 2019-09-09 ENCOUNTER — Encounter: Payer: Self-pay | Admitting: *Deleted

## 2019-09-09 ENCOUNTER — Telehealth: Payer: Self-pay | Admitting: *Deleted

## 2019-09-09 NOTE — Telephone Encounter (Signed)
I have left a voicemail for patient to call back. 

## 2019-09-09 NOTE — Telephone Encounter (Signed)
-----   Message from Larina Bras, [place] sent at 09/09/2019  9:34 AM EDT ----- ===View-only below this line=== ----- Message ----- From: Irene Shipper, MD Sent: 09/08/2019  10:54 AM EDT To: Larina Bras, CMA, Jerene Bears, MD Subject: Food impaction.  Needs follow-up               Patient with recurrent food impactions.  Had one attended to endoscopically this morning.  Had one last year for which follow-up outpatient dilation was recommended (but he did not follow through).  I advised him that he needs outpatient esophageal dilation.  I asked him to reach out to [place], but reach out to him as well Dottie. ThanksJP

## 2019-09-11 NOTE — Telephone Encounter (Signed)
I have left a voicemail for patient to call back. 

## 2019-09-16 NOTE — Telephone Encounter (Signed)
Left message for patient to call back  

## 2019-09-17 NOTE — Telephone Encounter (Signed)
Patient has not returned our call. I will send a letter in hopes he responds to that.

## 2021-10-27 ENCOUNTER — Encounter: Payer: Self-pay | Admitting: Internal Medicine

## 2022-05-05 ENCOUNTER — Observation Stay (HOSPITAL_COMMUNITY)
Admission: EM | Admit: 2022-05-05 | Discharge: 2022-05-06 | Disposition: A | Discharge status: Home / Self Care | Payer: Self-pay | Attending: Internal Medicine | Admitting: Internal Medicine

## 2022-05-05 ENCOUNTER — Other Ambulatory Visit: Payer: Self-pay

## 2022-05-05 ENCOUNTER — Encounter (HOSPITAL_COMMUNITY): Payer: Self-pay

## 2022-05-05 DIAGNOSIS — N289 Disorder of kidney and ureter, unspecified: Secondary | ICD-10-CM | POA: Insufficient documentation

## 2022-05-05 DIAGNOSIS — E7800 Pure hypercholesterolemia, unspecified: Secondary | ICD-10-CM | POA: Insufficient documentation

## 2022-05-05 DIAGNOSIS — Z79899 Other long term (current) drug therapy: Secondary | ICD-10-CM | POA: Insufficient documentation

## 2022-05-05 DIAGNOSIS — E785 Hyperlipidemia, unspecified: Secondary | ICD-10-CM | POA: Diagnosis present

## 2022-05-05 DIAGNOSIS — R2689 Other abnormalities of gait and mobility: Secondary | ICD-10-CM | POA: Insufficient documentation

## 2022-05-05 DIAGNOSIS — R55 Syncope and collapse: Principal | ICD-10-CM | POA: Diagnosis present

## 2022-05-05 DIAGNOSIS — E782 Mixed hyperlipidemia: Secondary | ICD-10-CM | POA: Diagnosis present

## 2022-05-05 DIAGNOSIS — K219 Gastro-esophageal reflux disease without esophagitis: Secondary | ICD-10-CM | POA: Insufficient documentation

## 2022-05-05 DIAGNOSIS — I10 Essential (primary) hypertension: Secondary | ICD-10-CM | POA: Insufficient documentation

## 2022-05-05 LAB — URINALYSIS, ROUTINE W REFLEX MICROSCOPIC
Bacteria, UA: NONE SEEN
Bilirubin Urine: NEGATIVE
Glucose, UA: NEGATIVE mg/dL
Hgb urine dipstick: NEGATIVE
Ketones, ur: NEGATIVE mg/dL
Nitrite: NEGATIVE
Protein, ur: NEGATIVE mg/dL
Specific Gravity, Urine: 1.01 (ref 1.005–1.030)
pH: 5 (ref 5.0–8.0)

## 2022-05-05 LAB — COMPREHENSIVE METABOLIC PANEL
ALT: 16 U/L (ref 0–44)
AST: 16 U/L (ref 15–41)
Albumin: 3.4 g/dL — ABNORMAL LOW (ref 3.5–5.0)
Alkaline Phosphatase: 48 U/L (ref 38–126)
Anion gap: 13 (ref 5–15)
BUN: 13 mg/dL (ref 8–23)
CO2: 23 mmol/L (ref 22–32)
Calcium: 9.1 mg/dL (ref 8.9–10.3)
Chloride: 105 mmol/L (ref 98–111)
Creatinine, Ser: 1.69 mg/dL — ABNORMAL HIGH (ref 0.61–1.24)
GFR, Estimated: 45 mL/min — ABNORMAL LOW (ref >60)
Glucose, Bld: 97 mg/dL (ref 70–99)
Potassium: 3.9 mmol/L (ref 3.5–5.1)
Sodium: 141 mmol/L (ref 135–145)
Total Bilirubin: 0.4 mg/dL (ref 0.3–1.2)
Total Protein: 7 g/dL (ref 6.5–8.1)

## 2022-05-05 LAB — CBC WITH DIFFERENTIAL/PLATELET
Abs Immature Granulocytes: 0.05 10*3/uL (ref 0.00–0.07)
Basophils Absolute: 0.1 10*3/uL (ref 0.0–0.1)
Basophils Relative: 1 %
Eosinophils Absolute: 0.1 10*3/uL (ref 0.0–0.5)
Eosinophils Relative: 1 %
HCT: 37.1 % — ABNORMAL LOW (ref 39.0–52.0)
Hemoglobin: 11.9 g/dL — ABNORMAL LOW (ref 13.0–17.0)
Immature Granulocytes: 0 %
Lymphocytes Relative: 9 %
Lymphs Abs: 1.1 10*3/uL (ref 0.7–4.0)
MCH: 28 pg (ref 26.0–34.0)
MCHC: 32.1 g/dL (ref 30.0–36.0)
MCV: 87.3 fL (ref 80.0–100.0)
Monocytes Absolute: 0.6 10*3/uL (ref 0.1–1.0)
Monocytes Relative: 5 %
Neutro Abs: 10.3 10*3/uL — ABNORMAL HIGH (ref 1.7–7.7)
Neutrophils Relative %: 84 %
Platelets: 314 10*3/uL (ref 150–400)
RBC: 4.25 MIL/uL (ref 4.22–5.81)
RDW: 14.6 % (ref 11.5–15.5)
WBC: 12.1 10*3/uL — ABNORMAL HIGH (ref 4.0–10.5)
nRBC: 0 % (ref 0.0–0.2)

## 2022-05-05 LAB — MAGNESIUM: Magnesium: 1.9 mg/dL (ref 1.7–2.4)

## 2022-05-05 LAB — ETHANOL: Alcohol, Ethyl (B): <10 mg/dL (ref <10)

## 2022-05-05 MED ORDER — ONDANSETRON HCL 4 MG/2ML IJ SOLN: 4.0000 mg | Freq: Four times a day (QID) | INTRAMUSCULAR | Status: DC | PRN

## 2022-05-05 MED ORDER — ACETAMINOPHEN 325 MG PO TABS: 650.0000 mg | ORAL_TABLET | Freq: Four times a day (QID) | ORAL | Status: DC | PRN

## 2022-05-05 MED ORDER — LACTATED RINGERS IV SOLN: INTRAVENOUS | Status: AC

## 2022-05-05 MED ORDER — ONDANSETRON HCL 4 MG PO TABS: 4.0000 mg | ORAL_TABLET | Freq: Four times a day (QID) | ORAL | Status: DC | PRN

## 2022-05-05 MED ORDER — SENNOSIDES-DOCUSATE SODIUM 8.6-50 MG PO TABS: 1.0000 | ORAL_TABLET | Freq: Every evening | ORAL | Status: DC | PRN

## 2022-05-05 MED ORDER — SODIUM CHLORIDE 0.9% FLUSH
3.0000 mL | Freq: Two times a day (BID) | INTRAVENOUS | Status: DC
  Administered 2022-05-05 – 2022-05-06 (×2): 3 mL via INTRAVENOUS

## 2022-05-05 MED ORDER — ACETAMINOPHEN 650 MG RE SUPP: 650.0000 mg | Freq: Four times a day (QID) | RECTAL | Status: DC | PRN

## 2022-05-05 MED ORDER — PANTOPRAZOLE SODIUM 20 MG PO TBEC
40.0000 mg | DELAYED_RELEASE_TABLET | Freq: Every morning | ORAL | Status: DC
  Administered 2022-05-06: 40 mg via ORAL
  Filled 2022-05-05: qty 2

## 2022-05-05 MED ORDER — ENOXAPARIN SODIUM 40 MG/0.4ML IJ SOSY
40.0000 mg | PREFILLED_SYRINGE | INTRAMUSCULAR | Status: DC
  Administered 2022-05-05: 40 mg via SUBCUTANEOUS
  Filled 2022-05-05: qty 0.4

## 2022-05-05 MED ORDER — PRAVASTATIN SODIUM 40 MG PO TABS: 40.0000 mg | ORAL_TABLET | Freq: Every day | ORAL | Status: DC

## 2022-05-05 MED ORDER — LACTATED RINGERS IV BOLUS
500.0000 mL | Freq: Once | INTRAVENOUS | Status: AC
  Administered 2022-05-05: 500 mL via INTRAVENOUS

## 2022-05-05 NOTE — Assessment & Plan Note (Signed)
Holding home amlodipine, losartan-HCTZ due to hypotension.

## 2022-05-05 NOTE — ED Provider Notes (Signed)
[facility] EMERGENCY DEPARTMENT Provider Note   CSN: [id number] Arrival date & time: 05/05/22  1516     History  Chief Complaint  Patient presents with   Near Syncope    Jeffrey Potter is a 61 y.o. male.  61 year old male with past medical history significant for HTN on multiple antihypertensives, HLD presenting following syncopal event.  He reports that he was watching a football game with family, had episode of blurred/tunnel vision, nausea, warmth, and passed out.  He states that he was surrounded by family and that he did not fall when this happened.  He denies preceding illness over the past 2 weeks.  He denies prior cardiac history including prior MI, heart failure.  He denies chest pain, shortness of breath, palpitations.  He reports that this felt similar to the prior 2 times this is happened this year.  He states that this kind of event has happened 3 total times this year.  He has not been evaluated by medical provider for any of the prior events.  Family member who arrived at bedside does describe an event with patient losing consciousness with diaphoresis.  Patient does endorse daily drinking each evening.  He states he does not drink in the daytime.  He did take his antihypertensives this morning.  On chart review, patient takes amlodipine, losartan-hydrochlorothiazide.  He states he has not had much to eat or drink today.        Home Medications Prior to Admission medications   Medication Sig Start Date End Date Taking? Authorizing Provider  amLODipine (NORVASC) 5 MG tablet Take 5 mg by mouth in the morning.   Yes [provider]  losartan-hydrochlorothiazide (HYZAAR) 100-12.5 MG tablet Take 1 tablet by mouth in the morning.   Yes [provider]  lovastatin (MEVACOR) 20 MG tablet Take 40 mg by mouth in the morning.   Yes [provider]  omeprazole (PRILOSEC OTC) 20 MG tablet Take 20 mg by mouth in the morning.   Yes [provider]  potassium chloride SA (KLOR-CON M) 20 MEQ tablet Take 20 mEq by mouth in the morning.   Yes [provider]      Allergies    Patient has no known allergies.    Review of Systems   Review of Systems  Cardiovascular:  Positive for near-syncope.    Physical Exam Updated Vital Signs BP 136/78 (BP Location: Left Arm)   Pulse 82   Temp 98.4 F (36.9 C) (Oral)   Resp 16   Ht '5\' 10"'$  (1.778 m)   Wt 92.8 kg   SpO2 98%   BMI 29.36 kg/m  Physical Exam Vitals reviewed.  Constitutional:      General: He is not in acute distress.    Appearance: He is not ill-appearing, toxic-appearing or diaphoretic.  HENT:     Head: Normocephalic and atraumatic.     Nose: Nose normal.     Mouth/Throat:     Mouth: Mucous membranes are moist.     Pharynx: Oropharynx is clear. No oropharyngeal exudate or posterior oropharyngeal erythema.  Eyes:     Extraocular Movements: Extraocular movements intact.     Conjunctiva/sclera: Conjunctivae normal.     Pupils: Pupils are equal, round, and reactive to light.  Cardiovascular:     Rate and Rhythm: Normal rate and regular rhythm.     Pulses: Normal pulses.     Heart sounds: Normal heart sounds.  Pulmonary:     Effort:  Pulmonary effort is normal.     Breath sounds: Normal breath sounds.  Abdominal:     General: There is no distension.     Palpations: Abdomen is soft. There is no mass.     Tenderness: There is no abdominal tenderness. There is no guarding or rebound.  Musculoskeletal:        General: No tenderness.     Right lower leg: No edema.     Left lower leg: No edema.  Skin:    General: Skin is warm and dry.  Neurological:     Mental Status: He is alert and oriented to person, place, and time.     GCS: GCS eye subscore is 4. GCS verbal subscore is 5. GCS motor subscore is 6.     Cranial Nerves: No cranial nerve deficit, dysarthria or facial asymmetry.     Sensory: Sensation is intact. No sensory deficit.     Motor:  Tremor present. No weakness, atrophy, abnormal muscle tone (slight tremor of hands bilaterally at rest, patient states he has had this for years) or pronator drift.     Coordination: Coordination normal. Finger-Nose-Finger Test normal.     ED Results / Procedures / Treatments   Labs (all labs ordered are listed, but only abnormal results are displayed) Labs Reviewed  CBC WITH DIFFERENTIAL/PLATELET - Abnormal; Notable for the following components:      Result Value   WBC 12.1 (*)    Hemoglobin 11.9 (*)    HCT 37.1 (*)    Neutro Abs 10.3 (*)    All other components within normal limits  COMPREHENSIVE METABOLIC PANEL - Abnormal; Notable for the following components:   Creatinine, Ser 1.69 (*)    Albumin 3.4 (*)    GFR, Estimated 45 (*)    All other components within normal limits  URINALYSIS, ROUTINE W REFLEX MICROSCOPIC - Abnormal; Notable for the following components:   APPearance HAZY (*)    Leukocytes,Ua TRACE (*)    All other components within normal limits  ETHANOL  MAGNESIUM  HIV ANTIBODY (ROUTINE TESTING W REFLEX)  BASIC METABOLIC PANEL  CBC    EKG EKG Interpretation  Date/Time:  Thursday May 05 2022 15:34:42 EST Ventricular Rate:  61 PR Interval:  162 QRS Duration: 110 QT Interval:  405 QTC Calculation: 408 R Axis:   31 Text Interpretation: Sinus rhythm Incomplete left bundle branch block Low voltage, precordial leads No old tracing to compare Confirmed by Noemi Chapel [phone]) on 05/05/2022 3:53:07 PM  Radiology No results found.  Procedures Procedures    Medications Ordered in ED Medications  sodium chloride flush (NS) 0.9 % injection 3 mL (has no administration in time range)  enoxaparin (LOVENOX) injection 40 mg (40 mg Subcutaneous Given 05/05/22 2026)  lactated ringers infusion ( Intravenous New Bag/Given 05/05/22 2030)  acetaminophen (TYLENOL) tablet 650 mg (has no administration in time range)    Or  acetaminophen (TYLENOL) suppository 650  mg (has no administration in time range)  ondansetron (ZOFRAN) tablet 4 mg (has no administration in time range)    Or  ondansetron (ZOFRAN) injection 4 mg (has no administration in time range)  senna-docusate (Senokot-S) tablet 1 tablet (has no administration in time range)  pravastatin (PRAVACHOL) tablet 40 mg (has no administration in time range)  pantoprazole (PROTONIX) EC tablet 40 mg (has no administration in time range)  lactated ringers bolus 500 mL (0 mLs Intravenous Stopped 05/05/22 1710)    ED Course/ Medical Decision Making/ A&P  Medical Decision Making On my initial evaluation, patient is alert, oriented.  This is his third event without any prior evaluation.  He described feeling "queasy" prior to standing and then passing out.  He denies chest pain, shortness of breath.  Patient presentation is most concerning for syncope.  As he has had 3 total events without prior evaluation, his presentation is high risk.  He was initially hypotensive and bradycardic with EMS.  He has strong and equal radial pulses bilaterally.  No neurologic symptoms.  I do not think this is dissection.  EKG is unremarkable.  Patient did not fall and has reassuring neurologic exam, I do not think head CT is warranted at this time.  Patient had no postictal symptoms, no tongue biting or incontinence, this is not consistent with seizure. Plan for labs, likely admit for cardiac monitoring, possible echocardiogram, for his high risk syncope.   Amount and/or Complexity of Data Reviewed External Data Reviewed: notes. Labs: ordered. Decision-making details documented in ED Course. ECG/medicine tests: ordered and independent interpretation performed. Decision-making details documented in ED Course.  Risk Decision regarding hospitalization.   EKG shows sinus rhythm, no ST elevations, no ST depressions, no peaked or hyperacute T-waves.  QRS 110 ms.  CBC with slight leukocytosis at 12.1,  hemoglobin slightly low at 11.9.  CMP with elevated creatinine at 1.69, BUN within normal limits.  Electrolytes within normal limits, no anion gap, LFTs within normal limits.  No creatinine for comparison. UA overall unremarkable.  I discussed patient with the admitting team.  Patient is admitted to hospitalist service.         Final Clinical Impression(s) / ED Diagnoses Final diagnoses:  Syncope and collapse    Rx / DC Orders ED Discharge Orders     None         Luster Landsberg, MD 05/06/22 Greer Pickerel    Noemi Chapel, MD 05/07/22 1031

## 2022-05-05 NOTE — Assessment & Plan Note (Addendum)
Continue statin. 

## 2022-05-05 NOTE — Assessment & Plan Note (Signed)
Creatinine 1.69 with eGFR 45 on arrival.  No prior labs for comparison to specify whether AKI versus CKD stage IIIa. -Continue IV fluid hydration overnight -Hold losartan-HCTZ -Repeat labs in a.m.

## 2022-05-05 NOTE — ED Provider Notes (Signed)
I saw and evaluated the patient, reviewed the resident's note and I agree with the findings and plan.  Pertinent History: This patient is a 61 year-old male, he states that he did take a medication for blood pressure, according to the medical record he takes amlodipine and losartan hydrochlorothiazide.  No beta-blockers or calcium channel blockers.  He was at home during the football game when he started to feel very "queasy" and felt like he was going to pass out.  According to a family member who is at the bedside with him he actually did syncopize he was very diaphoretic and he was found to be bradycardic by the paramedics.  This did not occur with a change in position, he does report that this is happened several times in the past but not recently.  He has no chest pain or palpitations, he has no edema of his legs and on exam he has no acute findings.  He does appear a smile a bit fidgety.  He does endorse having a fairly routine evening alcohol consumption pattern but states that is usually 4 or 5 beers, he does not drink during the day, he is employed as a Clinical cytogeneticist.  The patient has not had a prior work-up for syncope, his EKG is unremarkable, he has had the occasional sinus pause on the monitor which seems a bit prolonged and unusual but no overt pathological arrhythmias or bradycardia arrhythmias that I have seen while he is here.  He did have some hypotension which seems to have resolved at this time.  Plan will be for labs, cardiac monitoring, admission for syncope work-up and possible echocardiogram.   I was personally present and directly supervised the following procedures:  Medical rescucitation / evaluation  I personally interpreted the EKG as well as the resident and agree with the interpretation on the resident's chart.   EKG Interpretation  Date/Time:  Thursday May 05 2022 15:34:42 EST Ventricular Rate:  61 PR Interval:  162 QRS Duration: 110 QT  Interval:  405 QTC Calculation: 408 R Axis:   31 Text Interpretation: Sinus rhythm Incomplete left bundle branch block Low voltage, precordial leads No old tracing to compare Confirmed by Noemi Chapel [phone]) on 05/05/2022 3:53:07 PM         Final diagnoses:  Syncope and collapse      Noemi Chapel, MD 05/07/22 1031

## 2022-05-05 NOTE — H&P (Signed)
History and Physical    Jeffrey Potter HBZ:[id number] DOB: Apr 09, 1961 DOA: 05/05/2022  PCP: Donnajean Lopes, MD  Patient coming from: Home  I have personally briefly reviewed patient's old medical records in [place]  Chief Complaint: Syncope  HPI: Jeffrey Potter is a 61 y.o. male with medical history significant for HTN, HLD, GERD who presents to the ED for evaluation of syncope.  Patient states that he was at home sitting down around 1:30 PM today (11/23) when he felt a little nauseous.  He had not eaten anything except a few crackers the whole day prior to this.  He stood up to have Thanksgiving dinner with his family when he became diaphoretic, lightheaded, and subsequently lost consciousness.  Family were by him and were able to catch him and bring him to the ground.  His sister states that he was in and out of consciousness for a few minutes.  EMS were called and on their arrival he was noted to be bradycardic with heart rate as low as 40 per ED triage documentation.  Family states that when EMS tried to stand him up he would pass out again.  He was reportedly hypotensive.  He did not have any chest pain, palpitations, emesis, abdominal pain, focal weakness.  He denies any recent dysuria or diarrhea.  He reports a similar episode last month while at football game and another last year.  He reports drinking about 4 beers nightly.  He says he has not had any alcohol so far today.  He denies any tobacco or illicit drug use.  He did take his antihypertensives earlier today.  ED Course  Labs/Imaging on admission: I have personally reviewed following labs and imaging studies.  Initial vitals showed BP 93/49, pulse 63, RR 18, temp 97.7 F, SpO2 100% on room air.  Orthostatic vitals with mild drop in SBP 108 from lying to 96 standing at 3 minutes but otherwise negative.  Labs show WBC 12.1, hemoglobin 11.9, platelets 314,000, sodium 141, potassium 3.9, bicarb 23, BUN 13,  creatinine 1.69, serum glucose 97, LFTs within normal limits.  Patient was given 500 cc LR.  The hospitalist service was consulted to admit for further evaluation and management.  Review of Systems: All systems reviewed and are negative except as documented in history of present illness above.   Past Medical History:  Diagnosis Date   GERD (gastroesophageal reflux disease)    High cholesterol    High cholesterol    Hypertension     Past Surgical History:  Procedure Laterality Date   BALLOON DILATION N/A 10/10/2017   Procedure: BALLOON DILATION;  Surgeon: Jerene Bears, MD;  Location: [facility];  Service: Gastroenterology;  Laterality: N/A;   ESOPHAGOGASTRODUODENOSCOPY N/A 08/18/2017   Procedure: ESOPHAGOGASTRODUODENOSCOPY (EGD);  Surgeon: Jerene Bears, MD;  Location: [facility];  Service: Gastroenterology;  Laterality: N/A;   ESOPHAGOGASTRODUODENOSCOPY N/A 09/17/2017   Procedure: ESOPHAGOGASTRODUODENOSCOPY (EGD);  Surgeon: Carol Ada, MD;  Location: [facility];  Service: Endoscopy;  Laterality: N/A;   ESOPHAGOGASTRODUODENOSCOPY N/A 10/08/2018   Procedure: ESOPHAGOGASTRODUODENOSCOPY (EGD);  Surgeon: Yetta Flock, MD;  Location: [facility];  Service: Gastroenterology;  Laterality: N/A;   ESOPHAGOGASTRODUODENOSCOPY N/A 09/08/2019   Procedure: ESOPHAGOGASTRODUODENOSCOPY (EGD);  Surgeon: Irene Shipper, MD;  Location: [facility];  Service: Endoscopy;  Laterality: N/A;   ESOPHAGOGASTRODUODENOSCOPY (EGD) WITH PROPOFOL N/A 10/10/2017   Procedure: ESOPHAGOGASTRODUODENOSCOPY (EGD) WITH PROPOFOL;  Surgeon: Jerene Bears, MD;  Location: [facility];  Service: Gastroenterology;  Laterality:  N/A;  with biopsy of gastric polyps   FOREIGN BODY REMOVAL  10/08/2018   Procedure: FOREIGN BODY REMOVAL;  Surgeon: Yetta Flock, MD;  Location: [facility];  Service: Gastroenterology;;  Food Impaction   FOREIGN BODY REMOVAL  09/08/2019   Procedure: FOREIGN BODY REMOVAL;  Surgeon: Irene Shipper, MD;  Location: [facility];  Service: Endoscopy;;    Social History:  reports that he has never smoked. He has never used smokeless tobacco. He reports current alcohol use. He reports that he does not use drugs.  No Known Allergies  Family History  Problem Relation Age of Onset   Cancer Father      Prior to Admission medications   Medication Sig Start Date End Date Taking? Authorizing Provider  amLODipine (NORVASC) 5 MG tablet Take 5 mg by mouth daily.    [provider]  losartan-hydrochlorothiazide (HYZAAR) 100-12.5 MG tablet Take 1 tablet by mouth daily.    [provider]  lovastatin (MEVACOR) 20 MG tablet Take 40 mg by mouth daily.    [provider]  Omega-3 Fatty Acids (FISH OIL) 1000 MG CAPS Take 2,000 mg by mouth daily.    [provider]  omeprazole (PRILOSEC) 20 MG capsule Take 2 capsules (40 mg total) by mouth daily. Patient taking differently: Take 20 mg by mouth daily.  08/18/17   Charlesetta Shanks, MD    Physical Exam: Vitals:   05/05/22 1530 05/05/22 1542 05/05/22 1608 05/05/22 1745  BP: (!) 94/48 (!) 93/49 98/61 111/72  Pulse:  61 64 69  Resp:  _0 Temp:      TempSrc:      SpO2:  100% 100% 100%  Weight:      Height:       Constitutional: Sitting up in bed, NAD, calm, comfortable Eyes: PERRL, EOMI, lids and conjunctivae normal ENMT: Mucous membranes are moist. Posterior pharynx clear of any exudate or lesions.Normal dentition.  Neck: normal, supple, no masses. Respiratory: clear to auscultation bilaterally, no wheezing, no crackles. Normal respiratory effort. No accessory muscle use.  Cardiovascular: Regular rate and rhythm, no murmurs / rubs / gallops. No extremity edema. 2+ pedal pulses. Abdomen: no tenderness, no masses palpated. No hepatosplenomegaly. Bowel sounds positive.  Musculoskeletal: no clubbing / cyanosis. No joint deformity upper and lower extremities. Good ROM, no contractures. Normal muscle tone.   Skin: no rashes, lesions, ulcers. No induration Neurologic: CN 2-12 grossly intact. Sensation intact. Strength 5/5 in all 4.  Mild intentional tremor both hands right more so than left. Psychiatric: Alert and oriented x 3.  Flat affect.  EKG: Personally reviewed. Sinus rhythm, rate 61, incomplete LBBB, no prior for comparison.  Assessment/Plan Principal Problem:   Syncope Active Problems:   Hypertension   Renal insufficiency   Hyperlipidemia   Jeffrey Potter is a 61 y.o. male with medical history significant for HTN, HLD, GERD who is admitted for syncope evaluation.  Assessment and Plan: * Syncope Differential includes orthostatic versus vasovagal however reportedly bradycardic as well with EMS. -Keep on telemetry -Obtain echocardiogram -Maintenance IV fluid hydration overnight -Hold antihypertensives for now -PT/OT eval  Hypertension Holding home amlodipine, losartan-HCTZ due to hypotension.  Renal insufficiency Creatinine 1.69 with eGFR 45 on arrival.  No prior labs for comparison to specify whether AKI versus CKD stage IIIa. -Continue IV fluid hydration overnight -Hold losartan-HCTZ -Repeat labs in a.m.  Hyperlipidemia Continue statin.  DVT prophylaxis: enoxaparin (LOVENOX) injection 40 mg Start: 05/05/22 2000 Code Status: Full code,  confirmed with patient on admission Family Communication: Sister at bedside Disposition Plan: From home and likely discharge to home pending clinical progress Consults called: None Severity of Illness: The appropriate patient status for this patient is OBSERVATION. Observation status is judged to be reasonable and necessary in order to provide the required intensity of service to ensure the patient's safety. The patient's presenting symptoms, physical exam findings, and initial radiographic and laboratory data in the context of their medical condition is felt to place them at decreased risk for further clinical deterioration. Furthermore,  it is anticipated that the patient will be medically stable for discharge from the hospital within 2 midnights of admission.   Zada Finders MD [facility]  If 7PM-7AM, please contact night-coverage [email or web address]  05/05/2022, 7:42 PM

## 2022-05-05 NOTE — Assessment & Plan Note (Signed)
Differential includes orthostatic versus vasovagal however reportedly bradycardic as well with EMS. -Keep on telemetry -Obtain echocardiogram -Maintenance IV fluid hydration overnight -Hold antihypertensives for now -PT/OT eval

## 2022-05-05 NOTE — ED Notes (Signed)
MD Sabra Heck made aware of low BP

## 2022-05-05 NOTE — Hospital Course (Signed)
Jeffrey Potter is a 61 y.o. male with medical history significant for HTN, HLD, GERD who is admitted for syncope evaluation.

## 2022-05-05 NOTE — ED Triage Notes (Signed)
Pt BIB by [facility] from home with c/o syncope. Pt stated this is his 3rd time passing out within a year. EMS stated pt has also been brady as low as 40.  EMS VS BP 90/60 HR 60

## 2022-05-06 ENCOUNTER — Other Ambulatory Visit: Payer: Self-pay | Admitting: Student

## 2022-05-06 ENCOUNTER — Observation Stay (HOSPITAL_BASED_OUTPATIENT_CLINIC_OR_DEPARTMENT_OTHER): Payer: Self-pay

## 2022-05-06 DIAGNOSIS — R55 Syncope and collapse: Secondary | ICD-10-CM

## 2022-05-06 LAB — BASIC METABOLIC PANEL
Anion gap: 10 (ref 5–15)
BUN: 15 mg/dL (ref 8–23)
CO2: 24 mmol/L (ref 22–32)
Calcium: 9.2 mg/dL (ref 8.9–10.3)
Chloride: 104 mmol/L (ref 98–111)
Creatinine, Ser: 1.62 mg/dL — ABNORMAL HIGH (ref 0.61–1.24)
GFR, Estimated: 47 mL/min — ABNORMAL LOW (ref >60)
Glucose, Bld: 145 mg/dL — ABNORMAL HIGH (ref 70–99)
Potassium: 3.9 mmol/L (ref 3.5–5.1)
Sodium: 138 mmol/L (ref 135–145)

## 2022-05-06 LAB — ECHOCARDIOGRAM COMPLETE
AR max vel: 2.4 cm2
AV Area VTI: 2.41 cm2
AV Area mean vel: 2.39 cm2
AV Mean grad: 12 mmHg
AV Peak grad: 23.2 mmHg
Ao pk vel: 2.41 m/s
Area-P 1/2: 4.86 cm2
Calc EF: 59.4 %
Height: 70 in
MV M vel: 1.57 m/s
MV Peak grad: 9.9 mmHg
Single Plane A2C EF: 53 %
Single Plane A4C EF: 66.9 %
Weight: 3273.6 oz

## 2022-05-06 LAB — CBC
HCT: 32.7 % — ABNORMAL LOW (ref 39.0–52.0)
Hemoglobin: 11 g/dL — ABNORMAL LOW (ref 13.0–17.0)
MCH: 28 pg (ref 26.0–34.0)
MCHC: 33.6 g/dL (ref 30.0–36.0)
MCV: 83.2 fL (ref 80.0–100.0)
Platelets: 287 10*3/uL (ref 150–400)
RBC: 3.93 MIL/uL — ABNORMAL LOW (ref 4.22–5.81)
RDW: 14.5 % (ref 11.5–15.5)
WBC: 8.1 10*3/uL (ref 4.0–10.5)
nRBC: 0 % (ref 0.0–0.2)

## 2022-05-06 LAB — MRSA NEXT GEN BY PCR, NASAL: MRSA by PCR Next Gen: NOT DETECTED

## 2022-05-06 LAB — HIV ANTIBODY (ROUTINE TESTING W REFLEX): HIV Screen 4th Generation wRfx: NONREACTIVE

## 2022-05-06 MED ORDER — PERFLUTREN LIPID MICROSPHERE
1.0000 mL | INTRAVENOUS | Status: AC | PRN
  Administered 2022-05-06: 2 mL via INTRAVENOUS

## 2022-05-06 NOTE — Progress Notes (Signed)
Mobility Specialist Progress Note:   05/06/22 1052  Mobility  Activity Ambulated with assistance in hallway  Level of Assistance Independent after set-up  Assistive Device None  Distance Ambulated (ft) 400 ft  Activity Response Tolerated well  $Mobility charge 1 Mobility   Pt received in bed willing to participate in mobility. No complaints of pain. Left in bed with call bell in reach and all needs met.   Jeffrey Potter Mobility Specialist Please contact via [employer] or  Rehab Office at [phone]

## 2022-05-06 NOTE — Progress Notes (Signed)
  Echocardiogram 2D Echocardiogram has been performed.  Wynelle Link 05/06/2022, 10:06 AM

## 2022-05-06 NOTE — Evaluation (Addendum)
Occupational Therapy Evaluation Patient Details Name: Jeffrey Potter MRN: [id number] DOB: 1960-11-18 Today's Date: 05/06/2022   History of Present Illness Pt is a 61 y/o male presenting on 11/23 with syncope. PMH includes: HTN.   Clinical Impression   PTA patient independent and working. Admitted for above and presents at baseline for ADLs, transfers and functional mobility.  VSS throughout session, see below for details (orthostatics assessed).  Pt with no acute OT needs identified and OT will sign off.     BP supine 147/84 (99) BP sitting   136/81 (98)  BP stand    146/88 (105) BP stand x 3 min 144/91 (103)   Recommendations for follow up therapy are one component of a multi-disciplinary discharge planning process, led by the attending physician.  Recommendations may be updated based on patient status, additional functional criteria and insurance authorization.   Follow Up Recommendations  No OT follow up     Assistance Recommended at Discharge None  Patient can return home with the following      Functional Status Assessment     Equipment Recommendations  None recommended by OT    Recommendations for Other Services       Precautions / Restrictions Precautions Precautions: None Restrictions Weight Bearing Restrictions: No      Mobility Bed Mobility Overal bed mobility: Independent                  Transfers Overall transfer level: Independent                        Balance Overall balance assessment: No apparent balance deficits (not formally assessed)                                         ADL either performed or assessed with clinical judgement   ADL Overall ADL's : Independent                                             Vision Baseline Vision/History: 1 Wears glasses Ability to See in Adequate Light: 0 Adequate Patient Visual Report: No change from baseline Vision Assessment?: No apparent  visual deficits     Perception     Praxis      Pertinent Vitals/Pain Pain Assessment Pain Assessment: No/denies pain     Hand Dominance Right   Extremity/Trunk Assessment Upper Extremity Assessment Upper Extremity Assessment: Overall WFL for tasks assessed   Lower Extremity Assessment Lower Extremity Assessment: Overall WFL for tasks assessed   Cervical / Trunk Assessment Cervical / Trunk Assessment: Kyphotic   Communication Communication Communication: No difficulties   Cognition Arousal/Alertness: Awake/alert Behavior During Therapy: WFL for tasks assessed/performed Overall Cognitive Status: Within Functional Limits for tasks assessed                                       General Comments  orthostatic BPs assessed, all VSS    Exercises     Shoulder Instructions      Home Living Family/patient expects to be discharged to:: Private residence Living Arrangements: Alone   Type of Home: House Home Access: Stairs to enter [employer] of Steps: 1  Entrance Stairs-Rails: None Home Layout: Two level;Able to live on main level with bedroom/bathroom (office upstairs) Alternate Level Stairs-Number of Steps: flight Alternate Level Stairs-Rails: None Bathroom Shower/Tub: Teacher, early years/pre: Standard     Home Equipment: Grab bars - tub/shower          Prior Functioning/Environment Prior Level of Function : Independent/Modified Independent;Working/employed;Driving               ADLs Comments: real estate appraisor        OT Problem List:        OT Treatment/Interventions:      OT Goals(Current goals can be found in the care plan section) Acute Rehab OT Goals Patient Stated Goal: home OT Goal Formulation: With patient  OT Frequency:      Co-evaluation              AM-PAC OT "6 Clicks" Daily Activity     Outcome Measure Help from another person eating meals?: None Help from another person taking  care of personal grooming?: None Help from another person toileting, which includes using toliet, bedpan, or urinal?: None Help from another person bathing (including washing, rinsing, drying)?: None Help from another person to put on and taking off regular upper body clothing?: None Help from another person to put on and taking off regular lower body clothing?: None 6 Click Score: 24   End of Session Nurse Communication: Mobility status  Activity Tolerance: Patient tolerated treatment well Patient left: in bed;with call bell/phone within reach  OT Visit Diagnosis: Other abnormalities of gait and mobility (R26.89)                Time: 1761-6073 OT Time Calculation (min): 16 min Charges:  OT General Charges $OT Visit: 1 Visit OT Evaluation $OT Eval Low Complexity: 1 Low  Jolaine Artist, OT Acute Rehabilitation Services Office [phone]   Delight Stare 05/06/2022, 8:19 AM

## 2022-05-06 NOTE — Progress Notes (Signed)
PT Cancellation Note  Patient Details Name: Jeffrey Potter MRN: [id number] DOB: [birth year]   Cancelled Treatment:    Reason Eval/Treat Not Completed: Other (comment);PT screened, no needs identified, will sign off.  Pt was independent today with mobility and orthostatics are negative.  Sign off for now.   Ramond Dial 05/06/2022, 10:27 AM  Mee Hives, PT PhD Acute Rehab Dept. Number: [place] [facility] [place]

## 2022-05-06 NOTE — Discharge Summary (Signed)
Physician Discharge Summary  Jeffrey Potter DJT:[id number] DOB: 03/21/1961 DOA: 05/05/2022  PCP: Donnajean Lopes, MD  Admit date: 05/05/2022 Discharge date: 05/06/2022  Admitted From: Home Disposition: Home  Recommendations for Outpatient Follow-up:  Follow up with PCP in 1-2 weeks Cardiology will schedule follow-up.    Discharge Condition:Stable   CODE STATUS:full code  Diet recommendation: low salt diet   Discharge Summary:  61 year old gentleman with history of hypertension, hyperlipidemia and GERD presented to the emergency room with one episode of syncope from home.  He was standing up from sitting position and became diaphoretic, lightheaded and subsequently lost consciousness but no trauma.  He regained consciousness with no recall of events.  EMS was called who reported blood pressure of 90 and heart rate of 40.  Patient had similar episode [place].  He had similar episode about a year ago.  No other symptoms. On arrival, hemodynamically stable.  Negative for orthostatic.  Patient is on losartan hydrochlorothiazide and amlodipine.  Admitted for monitoring.  Overnight, telemetry monitor with normal sinus rhythm and no evidence of arrhythmia or bradycardia.  2D echocardiogram is essentially normal.  Patient without orthostatic symptoms.  Plan: Probably vasovagal syncope.  Rule out arrhythmias. Discharge home.  Orthostatic precautions.  Continue amlodipine but will hold losartan hydrochlorothiazide and monitor blood pressures at home. Cardiology was notified for ambulatory event monitoring, patient will be mailed Zio patch at home and followed up at cardiology office. Advised alcohol cessation, adequate hydration.  Discharge Diagnoses:  Principal Problem:   Syncope Active Problems:   Hypertension   Renal insufficiency   Hyperlipidemia    Discharge Instructions  Discharge Instructions     Diet - low sodium heart healthy   Complete by:  As directed    Discharge instructions   Complete by: As directed    Take blood pressure at home and keep a log book and bring it to doctor.   Increase activity slowly   Complete by: As directed       Allergies as of 05/06/2022   No Known Allergies      Medication List     STOP taking these medications    losartan-hydrochlorothiazide 100-12.5 MG tablet Commonly known as: HYZAAR       TAKE these medications    amLODipine 5 MG tablet Commonly known as: NORVASC Take 5 mg by mouth in the morning.   lovastatin 20 MG tablet Commonly known as: MEVACOR Take 40 mg by mouth in the morning.   omeprazole 20 MG tablet Commonly known as: PRILOSEC OTC Take 20 mg by mouth in the morning.   potassium chloride SA 20 MEQ tablet Commonly known as: KLOR-CON M Take 20 mEq by mouth in the morning.        No Known Allergies  Consultations: None   Procedures/Studies: ECHOCARDIOGRAM COMPLETE  Result Date: 05/06/2022    ECHOCARDIOGRAM REPORT   Patient Name:   Jeffrey Potter Date of Exam: 05/06/2022 Medical Rec #:  [id number]     Height:       70.0 in Accession #:    [id number]    Weight:       204.6 lb Date of Birth:  01-10-1961    BSA:          2.108 m Patient Age:    72 years      BP:           146/88 mmHg Patient Gender: M  HR:           98 bpm. Exam Location:  Inpatient Procedure: 2D Echo, Color Doppler, Cardiac Doppler and Intracardiac            Opacification Agent Indications:    Syncope R55  History:        Patient has no prior history of Echocardiogram examinations.                 Signs/Symptoms:Syncope; Risk Factors:Hypertension, Dyslipidemia                 and Non-Smoker.  Sonographer:    Greer Pickerel Referring Phys: [id number] VISHAL R PATEL  Sonographer Comments: Technically difficult study due to poor echo windows. Image acquisition challenging due to patient body habitus and Image acquisition challenging due to respiratory motion. IMPRESSIONS  1. Left ventricular  ejection fraction, by estimation, is 70 to 75%. The left ventricle has hyperdynamic function. The left ventricle has no regional wall motion abnormalities. Left ventricular diastolic parameters were normal.  2. Right ventricular systolic function is hyperdynamic. The right ventricular size is normal. There is normal pulmonary artery systolic pressure.  3. The mitral valve was not well visualized. No evidence of mitral valve regurgitation. No evidence of mitral stenosis.  4. The aortic valve was not well visualized. Aortic valve regurgitation is not visualized. Increased aortic valve gradient likely related to hypdynamic function.  5. The inferior vena cava is normal in size with greater than 50% respiratory variability, suggesting right atrial pressure of 3 mmHg. Comparison(s): No prior Echocardiogram. FINDINGS  Left Ventricle: Left ventricular ejection fraction, by estimation, is 70 to 75%. The left ventricle has hyperdynamic function. The left ventricle has no regional wall motion abnormalities. Definity contrast agent was given IV to delineate the left ventricular endocardial borders. The left ventricular internal cavity size was normal in size. There is no left ventricular hypertrophy. Left ventricular diastolic parameters were normal. Right Ventricle: The right ventricular size is normal. No increase in right ventricular wall thickness. Right ventricular systolic function is hyperdynamic. There is normal pulmonary artery systolic pressure. The tricuspid regurgitant velocity is 1.62 m/s, and with an assumed right atrial pressure of 3 mmHg, the estimated right ventricular systolic pressure is 14.4 mmHg. Left Atrium: Left atrial size was normal in size. Right Atrium: Right atrial size was normal in size. Pericardium: There is no evidence of pericardial effusion. Presence of epicardial fat layer. Mitral Valve: The mitral valve was not well visualized. No evidence of mitral valve regurgitation. No evidence of mitral  valve stenosis. Tricuspid Valve: The tricuspid valve is normal in structure. Tricuspid valve regurgitation is not demonstrated. No evidence of tricuspid stenosis. Aortic Valve: The aortic valve was not well visualized. Aortic valve regurgitation is not visualized. No aortic stenosis is present. Aortic valve mean gradient measures 12.0 mmHg. Aortic valve peak gradient measures 23.2 mmHg. Aortic valve area, by VTI measures 2.41 cm. Pulmonic Valve: The pulmonic valve was normal in structure. Pulmonic valve regurgitation is not visualized. No evidence of pulmonic stenosis. Aorta: The aortic root and ascending aorta are structurally normal, with no evidence of dilitation. Venous: The inferior vena cava is normal in size with greater than 50% respiratory variability, suggesting right atrial pressure of 3 mmHg. IAS/Shunts: No atrial level shunt detected by color flow Doppler.  LEFT VENTRICLE PLAX 2D LVOT diam:     2.00 cm     Diastology LV SV:         90  LV e' medial:    14.50 cm/s LV SV Index:   42          LV E/e' medial:  7.2 LVOT Area:     3.14 cm    LV e' lateral:   12.70 cm/s                            LV E/e' lateral: 8.2  LV Volumes (MOD) LV vol d, MOD A2C: 72.4 ml LV vol d, MOD A4C: 99.3 ml LV vol s, MOD A2C: 34.0 ml LV vol s, MOD A4C: 32.9 ml LV SV MOD A2C:     38.4 ml LV SV MOD A4C:     99.3 ml LV SV MOD BP:      50.3 ml RIGHT VENTRICLE RV S prime:     21.00 cm/s TAPSE (M-mode): 2.3 cm LEFT ATRIUM             Index        RIGHT ATRIUM           Index LA Vol (A2C):   67.3 ml 31.93 ml/m  RA Area:     18.50 cm LA Vol (A4C):   43.6 ml 20.69 ml/m  RA Volume:   51.80 ml  24.58 ml/m LA Biplane Vol: 56.9 ml 27.00 ml/m  AORTIC VALVE AV Area (Vmax):    2.40 cm AV Area (Vmean):   2.39 cm AV Area (VTI):     2.41 cm AV Vmax:           241.00 cm/s AV Vmean:          157.000 cm/s AV VTI:            0.372 m AV Peak Grad:      23.2 mmHg AV Mean Grad:      12.0 mmHg LVOT Vmax:         184.00 cm/s LVOT Vmean:         119.500 cm/s LVOT VTI:          0.285 m LVOT/AV VTI ratio: 0.77  AORTA Ao Root diam: 3.80 cm Ao Asc diam:  3.40 cm MITRAL VALVE                TRICUSPID VALVE MV Area (PHT): 4.86 cm     TR Peak grad:   10.5 mmHg MV Decel Time: 156 msec     TR Vmax:        162.00 cm/s MR Peak grad: 9.9 mmHg MR Vmax:      157.00 cm/s   SHUNTS MV E velocity: 104.00 cm/s  Systemic VTI:  0.29 m MV A velocity: 88.30 cm/s   Systemic Diam: 2.00 cm MV E/A ratio:  1.18 Rudean Haskell MD Electronically signed by Rudean Haskell MD Signature Date/Time: 05/06/2022/10:23:37 AM    Final    (Echo, Carotid, EGD, Colonoscopy, ERCP)    Subjective: Seen and examined.  Friend at the bedside.  Denies any complaints today.   Discharge Exam: Vitals:   05/06/22 0344 05/06/22 0808  BP: (!) 146/74 (!) 146/88  Pulse: 84 (!) 104  Resp: 20 20  Temp: 98.6 F (37 C) 98.6 F (37 C)  SpO2: 98% 97%   Vitals:   05/05/22 2152 05/06/22 0012 05/06/22 0344 05/06/22 0808  BP: (!) 145/58 136/78 (!) 146/74 (!) 146/88  Pulse: 84 82 84 (!) 104  Resp: '19 16 20 20  '$ Temp: 98.8 F (37.1 C) 98.4  F (36.9 C) 98.6 F (37 C) 98.6 F (37 C)  TempSrc: Oral Oral Oral Oral  SpO2: 99% 98% 98% 97%  Weight: 92.8 kg     Height:        General: Pt is alert, awake, not in acute distress Cardiovascular: RRR, S1/S2 +, no rubs, no gallops Respiratory: CTA bilaterally, no wheezing, no rhonchi Abdominal: Soft, NT, ND, bowel sounds + Extremities: no edema, no cyanosis    The results of significant diagnostics from this hospitalization (including imaging, microbiology, ancillary and laboratory) are listed below for reference.     Microbiology: Recent Results (from the past 240 hour(s))  MRSA Next Gen by PCR, Nasal     Status: None   Collection Time: 05/06/22  3:42 AM   Specimen: Nasal Mucosa; Nasal Swab  Result Value Ref Range Status   MRSA by PCR Next Gen NOT DETECTED NOT DETECTED Final    Comment: (NOTE) The GeneXpert MRSA Assay  (FDA approved for NASAL specimens only), is one component of a comprehensive MRSA colonization surveillance program. It is not intended to diagnose MRSA infection nor to guide or monitor treatment for MRSA infections. Test performance is not FDA approved in patients less than 24 years old. Performed at [facility], [place] [address], [place], [place] [address]      Labs: BNP (last 3 results) No results for input(s): "BNP" in the last 8760 hours. Basic Metabolic Panel: Recent Labs  Lab 05/05/22 1653 05/05/22 2208 05/06/22 0057  NA 141  --  138  K 3.9  --  3.9  CL 105  --  104  CO2 23  --  24  GLUCOSE 97  --  145*  BUN 13  --  15  CREATININE 1.69*  --  1.62*  CALCIUM 9.1  --  9.2  MG  --  1.9  --    Liver Function Tests: Recent Labs  Lab 05/05/22 1653  AST 16  ALT 16  ALKPHOS 48  BILITOT 0.4  PROT 7.0  ALBUMIN 3.4*   No results for input(s): "LIPASE", "AMYLASE" in the last 168 hours. No results for input(s): "AMMONIA" in the last 168 hours. CBC: Recent Labs  Lab 05/05/22 1653 05/06/22 0057  WBC 12.1* 8.1  NEUTROABS 10.3*  --   HGB 11.9* 11.0*  HCT 37.1* 32.7*  MCV 87.3 83.2  PLT 314 287   Cardiac Enzymes: No results for input(s): "CKTOTAL", "CKMB", "CKMBINDEX", "TROPONINI" in the last 168 hours. BNP: Invalid input(s): "POCBNP" CBG: No results for input(s): "GLUCAP" in the last 168 hours. D-Dimer No results for input(s): "DDIMER" in the last 72 hours. Hgb A1c No results for input(s): "HGBA1C" in the last 72 hours. Lipid Profile No results for input(s): "CHOL", "HDL", "LDLCALC", "TRIG", "CHOLHDL", "LDLDIRECT" in the last 72 hours. Thyroid function studies No results for input(s): "TSH", "T4TOTAL", "T3FREE", "THYROIDAB" in the last 72 hours.  Invalid input(s): "FREET3" Anemia work up No results for input(s): "VITAMINB12", "FOLATE", "FERRITIN", "TIBC", "IRON", "RETICCTPCT" in the last 72 hours. Urinalysis    Component Value Date/Time    COLORURINE YELLOW 05/05/2022 1836   APPEARANCEUR HAZY (A) 05/05/2022 1836   LABSPEC 1.010 05/05/2022 1836   PHURINE 5.0 05/05/2022 1836   GLUCOSEU NEGATIVE 05/05/2022 1836   HGBUR NEGATIVE 05/05/2022 1836   BILIRUBINUR NEGATIVE 05/05/2022 1836   KETONESUR NEGATIVE 05/05/2022 1836   PROTEINUR NEGATIVE 05/05/2022 1836   NITRITE NEGATIVE 05/05/2022 1836   LEUKOCYTESUR TRACE (A) 05/05/2022 1836   Sepsis Labs Recent  Labs  Lab 05/05/22 1653 05/06/22 0057  WBC 12.1* 8.1   Microbiology Recent Results (from the past 240 hour(s))  MRSA Next Gen by PCR, Nasal     Status: None   Collection Time: 05/06/22  3:42 AM   Specimen: Nasal Mucosa; Nasal Swab  Result Value Ref Range Status   MRSA by PCR Next Gen NOT DETECTED NOT DETECTED Final    Comment: (NOTE) The GeneXpert MRSA Assay (FDA approved for NASAL specimens only), is one component of a comprehensive MRSA colonization surveillance program. It is not intended to diagnose MRSA infection nor to guide or monitor treatment for MRSA infections. Test performance is not FDA approved in patients less than 52 years old. Performed at [facility], [place] [address] [place], [place] [address]      Time coordinating discharge: 32 minutes  SIGNED:   Barb Merino, MD  [facility] 05/06/2022, 10:58 AM

## 2022-05-06 NOTE — Progress Notes (Signed)
  Asked by Dr. Sloan Leiter to arrange outpatient 30 day Event Monitor for further evaluation of syncope. Plan is for discharge today. Will place order for monitor and this will be mailed to his house. We did not see patient during this admission and have not seen patient in our office before. Therefore, will also arrange a New Patient Visit with Dr. Sallyanne Kuster in 6-8 weeks to review monitor results.  Darreld Mclean, PA-C 05/06/2022 11:33 AM

## 2022-05-09 ENCOUNTER — Encounter: Payer: Self-pay | Admitting: *Deleted

## 2022-05-09 NOTE — Progress Notes (Signed)
Patient enrolled for [employer] to ship a 30 day cardiac event monitor to his address on file. Letter with instructions and self pay discount program mailed to patient.  Dr. Sallyanne Kuster to read.

## 2022-06-10 ENCOUNTER — Telehealth: Payer: Self-pay | Admitting: *Deleted

## 2022-06-10 NOTE — Telephone Encounter (Signed)
Jeffrey Potter contacted to have monitor shipped asap. [employer] stated they did not receive a call back to confirm benefit / self pay quote. Patient stated [employer] called to confirm shipping address, but he did not recall anything regarding a benefit quote. Patient states he did not receive my letter with monitor information and instructions.  Letter reprinted and mailed to confirmed address. Once patient receives monitor he will call to reschedule his new patient appointment to allow for results of 30 day monitor to be completed.

## 2022-06-10 NOTE — Telephone Encounter (Signed)
Returning telephone call.  Please call [phone].

## 2022-06-10 NOTE — Telephone Encounter (Signed)
-----   Message from April Henson sent at 06/09/2022  9:47 AM EST ----- Contact: [phone] Pt states that he never received his monitor and would like a call back to discuss.

## 2022-06-20 ENCOUNTER — Ambulatory Visit: Payer: Self-pay | Admitting: Cardiovascular Disease

## 2022-06-26 ENCOUNTER — Ambulatory Visit: Payer: Self-pay | Attending: Student

## 2022-06-26 DIAGNOSIS — R55 Syncope and collapse: Secondary | ICD-10-CM

## 2022-07-24 ENCOUNTER — Telehealth: Payer: Self-pay | Admitting: Cardiology

## 2022-07-24 NOTE — Telephone Encounter (Signed)
Addendum- Attempted to call patient again this afternoon, patient did not answer  Margie Billet, PA-C 07/24/2022 1:37 PM

## 2022-07-24 NOTE — Telephone Encounter (Signed)
Notified by [employer] that there was a critical EKG result to report on this patient. At 11:30 AM [place] time, patient had a 12 beat run of V-tach. Prior to v-tach, patient was in NSR with hr 68 BPM. He then had 12 beats of V-tach with HR 202 Bpm, then converted back to NSR. Also noted that a monitor lead was detached after the transmission, but the lead was back on by 1215 and monitor was working correctly at that time. At 1215, patient was in NSR.   I attempted to contact patient at both phone numbers listed in his chart. His cell phone number directed me to "Felicie Morn". Patient did not answer his home phone.   Margie Billet, PA-C 07/24/2022 12:25 PM

## 2022-08-04 ENCOUNTER — Telehealth: Payer: Self-pay | Admitting: Cardiovascular Disease

## 2022-08-04 NOTE — Telephone Encounter (Signed)
Attempted to call patient, left message for patient to call back to office.     Darreld Mclean, PA-C 08/03/2022  7:48 AM EST     Please notify patient of results: Monitor showed one single short run of non-sustained VT (a fast heart rhythm coming from the bottom of the heart) lasting 12 beats as well as one single short run of ectopic atrial tachycardia (a fast heart rhythm coming from the top of the heart) lasting 15 beats but there was no evidence of any significant fast or slow arrhythmias to explain his recent passing out episode. Patient should keep follow-up visit with Dr. Sallyanne Kuster on 08/10/2022 at which time he can review monitor results in more detail with patient and discuss any recommendations.   Thank you!

## 2022-08-04 NOTE — Telephone Encounter (Signed)
Pt is returning call in regards to results. Requesting return call.

## 2022-08-08 NOTE — Telephone Encounter (Signed)
Left merssage for patient to call with questions if needed, otherwise he can discuss the results at his appointment Wednesday this week.

## 2022-08-10 ENCOUNTER — Ambulatory Visit: Payer: Self-pay | Attending: Cardiovascular Disease | Admitting: Cardiovascular Disease

## 2022-08-10 ENCOUNTER — Encounter: Payer: Self-pay | Admitting: Cardiovascular Disease

## 2022-08-10 VITALS — BP 140/80 | HR 93 | Ht 70.0 in | Wt 207.4 lb

## 2022-08-10 DIAGNOSIS — I4729 Other ventricular tachycardia: Secondary | ICD-10-CM

## 2022-08-10 DIAGNOSIS — E7800 Pure hypercholesterolemia, unspecified: Secondary | ICD-10-CM

## 2022-08-10 DIAGNOSIS — Z136 Encounter for screening for cardiovascular disorders: Secondary | ICD-10-CM

## 2022-08-10 DIAGNOSIS — R55 Syncope and collapse: Secondary | ICD-10-CM

## 2022-08-10 DIAGNOSIS — I10 Essential (primary) hypertension: Secondary | ICD-10-CM

## 2022-08-10 DIAGNOSIS — R7989 Other specified abnormal findings of blood chemistry: Secondary | ICD-10-CM

## 2022-08-10 NOTE — Progress Notes (Signed)
Cardiology Office Note:    Date:  08/11/2022   ID:  Jeffrey Potter, DOB 02-Jan-1961, MRN [id number]  PCP:  Donnajean Lopes, MD   [place] Providers Cardiologist:  Sanda Klein, MD     Referring MD: Donnajean Lopes, MD   Chief Complaint  Patient presents with   Consult  Jeffrey Potter is a 62 y.o. male who is being seen today for the evaluation of syncope at the request of Donnajean Lopes, MD.   History of Present Illness:    Jeffrey Potter is a 62 y.o. male with a hx of hypertension, hypercholesterolemia, GERD, who was hospitalized in late November 2023 with an episode of syncope.    He had a total of 3 syncopal events throughout the last several years.  2 of these occurred when he suddenly stood from a sitting position and on each occasion he developed diaphoresis, lightheadedness and a prolonged prodrome before he actually passed out.  When EMS arrived his systolic blood pressure was only 90 and his heart rate was 40 bpm.  He had 1 episode of syncope that occurred while sitting, without standing up when [place].  It was warm, but not particularly hot.  On each occasion he recovered from syncope very promptly and did not have any residual symptoms after a few minutes.  Prior to these events his antihypertensive medication had included hydrochlorothiazide, which was stopped.  The echo performed during his hospital stay last November showed normal findings with hyperdynamic LV function and EF of 70-75%.  There was no significant valvular abnormality identified.  He subsequently wore an event monitor which was mostly normal except for a 12 beat episode of nonsustained VT and a 15 beat episode of ectopic atrial tachycardia.  Neither episode appeared to be symptomatic.  There was no meaningful bradycardia seen.  He currently does not have medical insurance and his hospital bills are being paid by his mother.  The patient specifically  denies any chest pain at rest exertion, dyspnea at rest or with exertion, orthopnea, paroxysmal nocturnal dyspnea, syncope, palpitations, focal neurological deficits, intermittent claudication, lower extremity edema, unexplained weight gain, cough, hemoptysis or wheezing.   Past Medical History:  Diagnosis Date   GERD (gastroesophageal reflux disease)    High cholesterol    High cholesterol    Hypertension     Past Surgical History:  Procedure Laterality Date   BALLOON DILATION N/A 10/10/2017   Procedure: BALLOON DILATION;  Surgeon: Jerene Bears, MD;  Location: [facility];  Service: Gastroenterology;  Laterality: N/A;   ESOPHAGOGASTRODUODENOSCOPY N/A 08/18/2017   Procedure: ESOPHAGOGASTRODUODENOSCOPY (EGD);  Surgeon: Jerene Bears, MD;  Location: [facility];  Service: Gastroenterology;  Laterality: N/A;   ESOPHAGOGASTRODUODENOSCOPY N/A 09/17/2017   Procedure: ESOPHAGOGASTRODUODENOSCOPY (EGD);  Surgeon: Carol Ada, MD;  Location: [facility];  Service: Endoscopy;  Laterality: N/A;   ESOPHAGOGASTRODUODENOSCOPY N/A 10/08/2018   Procedure: ESOPHAGOGASTRODUODENOSCOPY (EGD);  Surgeon: Yetta Flock, MD;  Location: [facility];  Service: Gastroenterology;  Laterality: N/A;   ESOPHAGOGASTRODUODENOSCOPY N/A 09/08/2019   Procedure: ESOPHAGOGASTRODUODENOSCOPY (EGD);  Surgeon: Irene Shipper, MD;  Location: [facility];  Service: Endoscopy;  Laterality: N/A;   ESOPHAGOGASTRODUODENOSCOPY (EGD) WITH PROPOFOL N/A 10/10/2017   Procedure: ESOPHAGOGASTRODUODENOSCOPY (EGD) WITH PROPOFOL;  Surgeon: Jerene Bears, MD;  Location: [facility];  Service: Gastroenterology;  Laterality: N/A;  with biopsy of gastric polyps   FOREIGN BODY REMOVAL  10/08/2018   Procedure: FOREIGN BODY REMOVAL;  Surgeon: Yetta Flock, MD;  Location: [facility];  Service: Gastroenterology;;  Food Impaction   FOREIGN BODY REMOVAL  09/08/2019   Procedure: FOREIGN BODY REMOVAL;  Surgeon: Irene Shipper, MD;  Location: [facility];  Service: Endoscopy;;    Current Medications: Current Meds  Medication Sig   amLODipine (NORVASC) 5 MG tablet Take 5 mg by mouth in the morning.   lovastatin (MEVACOR) 20 MG tablet Take 40 mg by mouth in the morning.   Omega-3 Fatty Acids (FISH OIL) 1000 MG CAPS Take 1 capsule by mouth daily in the afternoon.   omeprazole (PRILOSEC OTC) 20 MG tablet Take 20 mg by mouth in the morning.   potassium chloride SA (KLOR-CON M) 20 MEQ tablet Take 20 mEq by mouth in the morning.     Allergies:   Patient has no known allergies.   Social History   Socioeconomic History   Marital status: Single    Spouse name: Not on file   Number of children: Not on file   Years of education: Not on file   Highest education level: Not on file  Occupational History   Not on file  Tobacco Use   Smoking status: Never   Smokeless tobacco: Never  Substance and Sexual Activity   Alcohol use: Yes    Comment: occasionally   Drug use: No   Sexual activity: Not on file  Other Topics Concern   Not on file  Social History Narrative   Not on file   Social Determinants of Health   Financial Resource Strain: Not on file  Food Insecurity: No Food Insecurity (05/05/2022)   Hunger Vital Sign    Worried About Running Out of Food in the Last Year: Never true    Ran Out of Food in the Last Year: Never true  Transportation Needs: No Transportation Needs (05/05/2022)   PRAPARE - Hydrologist (Medical): No    Lack of Transportation (Non-Medical): No  Physical Activity: Not on file  Stress: Not on file  Social Connections: Not on file     Family History: The patient's family history includes Cancer in his father.  ROS:   Please see the history of present illness.     All other systems reviewed and are negative.  EKGs/Labs/Other Studies Reviewed:    The following studies were reviewed today: Echocardiogram 05/06/2022   1. Left ventricular ejection fraction, by  estimation, is 70 to 75%. The  left ventricle has hyperdynamic function. The left ventricle has no  regional wall motion abnormalities. Left ventricular diastolic parameters  were normal.   2. Right ventricular systolic function is hyperdynamic. The right  ventricular size is normal. There is normal pulmonary artery systolic  pressure.   3. The mitral valve was not well visualized. No evidence of mitral valve  regurgitation. No evidence of mitral stenosis.   4. The aortic valve was not well visualized. Aortic valve regurgitation  is not visualized. Increased aortic valve gradient likely related to  hypdynamic function.   5. The inferior vena cava is normal in size with greater than 50%  respiratory variability, suggesting right atrial pressure of 3 mmHg.   Comparison(s): No prior Echocardiogram.    Arrhythmia monitor 07/28/2022 Mild abnormalities on arrhythmia monitor. There is a 12 beat run of nonsustained VT and a 15 beat run of nonsustained ectopic atrial tachycardia, but there is no evidence of sustained ventricular arrhythmia, atrial fibrillation or severe bradycardia that could explain syncope. EKG:  EKG is not ordered today.  The ekg ordered 05/05/2022 Shows normal sinus rhythm, normal tracing other than unusually early transition of precordial R wave in lead V2.  Recent Labs: 05/05/2022: ALT 16; Magnesium 1.9 05/06/2022: BUN 15; Creatinine, Ser 1.62; Hemoglobin 11.0; Platelets 287; Potassium 3.9; Sodium 138  Recent Lipid Panel No results found for: "CHOL", "TRIG", "HDL", "CHOLHDL", "VLDL", "LDLCALC", "LDLDIRECT"   Risk Assessment/Calculations:           Physical Exam:    VS:  BP (!) 140/80 (BP Location: Left Arm, Patient Position: Sitting, Cuff Size: Large)   Pulse 93   Ht '5\' 10"'$  (1.778 m)   Wt 207 lb 6.4 oz (94.1 kg)   SpO2 96%   BMI 29.76 kg/m     Wt Readings from Last 3 Encounters:  08/10/22 207 lb 6.4 oz (94.1 kg)  05/05/22 204 lb 9.6 oz (92.8 kg)  09/08/19  230 lb (104.3 kg)     GEN: Borderline obese well nourished, well developed in no acute distress HEENT: Normal NECK: No JVD; No carotid bruits LYMPHATICS: No lymphadenopathy CARDIAC: \RRR, no murmurs, rubs, gallops RESPIRATORY:  Clear to auscultation without rales, wheezing or rhonchi  ABDOMEN: Soft, non-tender, non-distended MUSCULOSKELETAL:  No edema; No deformity  SKIN: Warm and dry NEUROLOGIC:  Alert and oriented x 3 PSYCHIATRIC:  Normal affect   ASSESSMENT:    1. Vasovagal syncope   2. NSVT (nonsustained ventricular tachycardia) ([place])   3. Hypercholesterolemia   4. Essential hypertension   5. Encounter for screening for coronary artery disease   6. Elevated serum creatinine    PLAN:    In order of problems listed above:  Vasovagal syncope: All his events had a typical prodrome suggesting vasovagal mechanism, possibly with a component of orthostatic hypotension related to previous treatment with hydrochlorothiazide.  His creatinine was elevated at 1.6 at the time of his most recent syncope.  The diuretic has been stopped.  No new events.  He is walking half an hour a day and is feeling stronger.  Avoid dehydration, drink plenty of fluids.  If he can identify the triggers, these should be avoided.  Most importantly he should immediately pay heed to prodromal symptoms and lay flat if they occur. Nonsustained ventricular tachycardia: Single asymptomatic event was recorded during his arrhythmia monitoring.  Of uncertain clinical significance.  He has normal left ventricular systolic function and no significant structural abnormalities on echo.  He has never complained of angina pectoris at rest or with activity and has an essentially normal ECG.  Would ideally want to pursue a coronary CT angiogram, but the cost of this procedure is a problem at this time.  It is unlikely we would identify situations that would require revascularization.  Will check a coronary calcium score to make sure  we are doing all we need to do from a prevention standpoint. HLP: On statin.  Most recent LDL cholesterol was 81, which is satisfactory she does not have CAD.  If the coronary calcium score is high we will augment statin therapy to achieve target LDL less than 70. HTN: Borderline systolic blood pressure.  Would like to avoid perfect blood pressure control in view of his symptoms of orthostatic hypotension.  Continue current dose of amlodipine.  Encourage daily exercise, weight loss. Elevated creatinine: Creatinine was 1.6 when he had his most recent syncopal event.  We do not have older values for comparison.  Recheck           Medication Adjustments/Labs  and Tests Ordered: Current medicines are reviewed at length with the patient today.  Concerns regarding medicines are outlined above.  Orders Placed This Encounter  Procedures   CT CARDIAC SCORING (SELF PAY ONLY)   Basic metabolic panel   No orders of the defined types were placed in this encounter.   Patient Instructions  Medication Instructions:   No changes  *If you need a refill on your cardiac medications before your next appointment, please call your pharmacy*   Lab Work:  Not needed  If you have labs (blood work) drawn today and your tests are completely normal, you will receive your results only by: [place] (if you have MyChart) OR A paper copy in the mail If you have any lab test that is abnormal or we need to change your treatment, we will call you to review the results.   Testing/Procedures:  CT coronary calcium score.   Test locations:  [place]   This is $99 out of pocket.   Coronary CalciumScan A coronary calcium scan is an imaging test used to look for deposits of calcium and other fatty materials (plaques) in the inner lining of the blood vessels of the heart (coronary arteries). These deposits of calcium and plaques can partly clog and narrow the coronary  arteries without producing any symptoms or warning signs. This puts a person at risk for a heart attack. This test can detect these deposits before symptoms develop. Tell a health care provider about: Any allergies you have. All medicines you are taking, including vitamins, herbs, eye drops, creams, and over-the-counter medicines. Any problems you or family members have had with anesthetic medicines. Any blood disorders you have. Any surgeries you have had. Any medical conditions you have. Whether you are pregnant or may be pregnant. What are the risks? Generally, this is a safe procedure. However, problems may occur, including: Harm to a pregnant woman and her unborn baby. This test involves the use of radiation. Radiation exposure can be dangerous to a pregnant woman and her unborn baby. If you are pregnant, you generally should not have this procedure done. Slight increase in the risk of cancer. This is because of the radiation involved in the test. What happens before the procedure? No preparation is needed for this procedure. What happens during the procedure? You will undress and remove any jewelry around your neck or chest. You will put on a hospital gown. Sticky electrodes will be placed on your chest. The electrodes will be connected to an electrocardiogram (ECG) machine to record a tracing of the electrical activity of your heart. A CT scanner will take pictures of your heart. During this time, you will be asked to lie still and hold your breath for 2-3 seconds while a picture of your heart is being taken. The procedure may vary among health care providers and hospitals. What happens after the procedure? You can get dressed. You can return to your normal activities. It is up to you to get the results of your test. Ask your health care provider, or the department that is doing the test, when your results will be ready. Summary A coronary calcium scan is an imaging test used to look  for deposits of calcium and other fatty materials (plaques) in the inner lining of the blood vessels of the heart (coronary arteries). Generally, this is a safe procedure. Tell your health care provider if you are pregnant or may be pregnant. No preparation is needed for this  procedure. A CT scanner will take pictures of your heart. You can return to your normal activities after the scan is done. This information is not intended to replace advice given to you by your health care provider. Make sure you discuss any questions you have with your health care provider. Document Released: 11/26/2007 Document Revised: 04/18/2016 Document Reviewed: 04/18/2016 Elsevier Interactive Patient Education  2017 [place]: At [facility], you and your health needs are our priority.  As part of our continuing mission to provide you with exceptional heart care, we have created designated Provider Care Teams.  These Care Teams include your primary Cardiologist (physician) and Advanced Practice Providers (APPs -  Physician Assistants and Nurse Practitioners) who all work together to provide you with the care you need, when you need it.  We recommend signing up for the patient portal called "MyChart".  Sign up information is provided on this After Visit Summary.  MyChart is used to connect with patients for Virtual Visits (Telemedicine).  Patients are able to view lab/test results, encounter notes, upcoming appointments, etc.  Non-urgent messages can be sent to your provider as well.   To learn more about what you can do with MyChart, go to [email or web address].    Your next appointment:   12 month(s)  The format for your next appointment:   In Person  Provider:   Sanda Klein, MD       Signed, Sanda Klein, MD  08/11/2022 2:47 PM    [place]

## 2022-08-10 NOTE — Patient Instructions (Signed)
Medication Instructions:   No changes  *If you need a refill on your cardiac medications before your next appointment, please call your pharmacy*   Lab Work:  Not needed  If you have labs (blood work) drawn today and your tests are completely normal, you will receive your results only by: [place] (if you have MyChart) OR A paper copy in the mail If you have any lab test that is abnormal or we need to change your treatment, we will call you to review the results.   Testing/Procedures:  CT coronary calcium score.   Test locations:  [place]   This is $99 out of pocket.   Coronary CalciumScan A coronary calcium scan is an imaging test used to look for deposits of calcium and other fatty materials (plaques) in the inner lining of the blood vessels of the heart (coronary arteries). These deposits of calcium and plaques can partly clog and narrow the coronary arteries without producing any symptoms or warning signs. This puts a person at risk for a heart attack. This test can detect these deposits before symptoms develop. Tell a health care provider about: Any allergies you have. All medicines you are taking, including vitamins, herbs, eye drops, creams, and over-the-counter medicines. Any problems you or family members have had with anesthetic medicines. Any blood disorders you have. Any surgeries you have had. Any medical conditions you have. Whether you are pregnant or may be pregnant. What are the risks? Generally, this is a safe procedure. However, problems may occur, including: Harm to a pregnant woman and her unborn baby. This test involves the use of radiation. Radiation exposure can be dangerous to a pregnant woman and her unborn baby. If you are pregnant, you generally should not have this procedure done. Slight increase in the risk of cancer. This is because of the radiation involved in the test. What happens before the  procedure? No preparation is needed for this procedure. What happens during the procedure? You will undress and remove any jewelry around your neck or chest. You will put on a hospital gown. Sticky electrodes will be placed on your chest. The electrodes will be connected to an electrocardiogram (ECG) machine to record a tracing of the electrical activity of your heart. A CT scanner will take pictures of your heart. During this time, you will be asked to lie still and hold your breath for 2-3 seconds while a picture of your heart is being taken. The procedure may vary among health care providers and hospitals. What happens after the procedure? You can get dressed. You can return to your normal activities. It is up to you to get the results of your test. Ask your health care provider, or the department that is doing the test, when your results will be ready. Summary A coronary calcium scan is an imaging test used to look for deposits of calcium and other fatty materials (plaques) in the inner lining of the blood vessels of the heart (coronary arteries). Generally, this is a safe procedure. Tell your health care provider if you are pregnant or may be pregnant. No preparation is needed for this procedure. A CT scanner will take pictures of your heart. You can return to your normal activities after the scan is done. This information is not intended to replace advice given to you by your health care provider. Make sure you discuss any questions you have with your health care provider. Document Released: 11/26/2007 Document Revised: 04/18/2016 Document Reviewed:  04/18/2016 Elsevier Interactive Patient Education  2017 [place]: At [facility], you and your health needs are our priority.  As part of our continuing mission to provide you with exceptional heart care, we have created designated Provider Care Teams.  These Care Teams include your primary Cardiologist (physician) and  Advanced Practice Providers (APPs -  Physician Assistants and Nurse Practitioners) who all work together to provide you with the care you need, when you need it.  We recommend signing up for the patient portal called "MyChart".  Sign up information is provided on this After Visit Summary.  MyChart is used to connect with patients for Virtual Visits (Telemedicine).  Patients are able to view lab/test results, encounter notes, upcoming appointments, etc.  Non-urgent messages can be sent to your provider as well.   To learn more about what you can do with MyChart, go to [email or web address].    Your next appointment:   12 month(s)  The format for your next appointment:   In Person  Provider:   Sanda Klein, MD

## 2022-09-14 ENCOUNTER — Ambulatory Visit (HOSPITAL_BASED_OUTPATIENT_CLINIC_OR_DEPARTMENT_OTHER)
Admission: RE | Admit: 2022-09-14 | Discharge: 2022-09-14 | Disposition: A | Discharge status: Home / Self Care | Payer: Self-pay | Source: Ambulatory Visit | Attending: Cardiovascular Disease | Admitting: Cardiovascular Disease

## 2022-09-14 DIAGNOSIS — R7989 Other specified abnormal findings of blood chemistry: Secondary | ICD-10-CM | POA: Insufficient documentation

## 2022-09-14 DIAGNOSIS — Z136 Encounter for screening for cardiovascular disorders: Secondary | ICD-10-CM | POA: Insufficient documentation

## 2023-08-28 ENCOUNTER — Ambulatory Visit: Payer: Self-pay | Admitting: Gastroenterology

## 2023-09-15 ENCOUNTER — Emergency Department (HOSPITAL_COMMUNITY): Payer: MEDICAID

## 2023-09-15 ENCOUNTER — Other Ambulatory Visit: Payer: Self-pay

## 2023-09-15 ENCOUNTER — Encounter (HOSPITAL_COMMUNITY): Payer: Self-pay

## 2023-09-15 ENCOUNTER — Ambulatory Visit: Payer: Self-pay | Admitting: Gastroenterology

## 2023-09-15 ENCOUNTER — Inpatient Hospital Stay (HOSPITAL_COMMUNITY)
Admission: EM | Admit: 2023-09-15 | Discharge: 2023-09-19 | DRG: 173 | Disposition: A | Discharge status: Home / Self Care | Payer: MEDICAID | Source: Ambulatory Visit | Attending: Internal Medicine | Admitting: Internal Medicine

## 2023-09-15 ENCOUNTER — Encounter: Payer: Self-pay | Admitting: Gastroenterology

## 2023-09-15 VITALS — BP 90/60 | HR 107 | Ht 70.0 in | Wt 217.0 lb

## 2023-09-15 DIAGNOSIS — Z860101 Personal history of adenomatous and serrated colon polyps: Secondary | ICD-10-CM | POA: Diagnosis not present

## 2023-09-15 DIAGNOSIS — Z6830 Body mass index (BMI) 30.0-30.9, adult: Secondary | ICD-10-CM

## 2023-09-15 DIAGNOSIS — I82220 Acute embolism and thrombosis of inferior vena cava: Secondary | ICD-10-CM | POA: Diagnosis present

## 2023-09-15 DIAGNOSIS — E782 Mixed hyperlipidemia: Secondary | ICD-10-CM | POA: Diagnosis present

## 2023-09-15 DIAGNOSIS — Z7901 Long term (current) use of anticoagulants: Secondary | ICD-10-CM | POA: Diagnosis not present

## 2023-09-15 DIAGNOSIS — I10 Essential (primary) hypertension: Secondary | ICD-10-CM | POA: Diagnosis present

## 2023-09-15 DIAGNOSIS — I2609 Other pulmonary embolism with acute cor pulmonale: Principal | ICD-10-CM | POA: Diagnosis present

## 2023-09-15 DIAGNOSIS — J9601 Acute respiratory failure with hypoxia: Secondary | ICD-10-CM | POA: Diagnosis present

## 2023-09-15 DIAGNOSIS — I2699 Other pulmonary embolism without acute cor pulmonale: Secondary | ICD-10-CM

## 2023-09-15 DIAGNOSIS — J90 Pleural effusion, not elsewhere classified: Secondary | ICD-10-CM | POA: Diagnosis not present

## 2023-09-15 DIAGNOSIS — D649 Anemia, unspecified: Secondary | ICD-10-CM

## 2023-09-15 DIAGNOSIS — Z8719 Personal history of other diseases of the digestive system: Secondary | ICD-10-CM

## 2023-09-15 DIAGNOSIS — G4700 Insomnia, unspecified: Secondary | ICD-10-CM | POA: Diagnosis present

## 2023-09-15 DIAGNOSIS — I272 Pulmonary hypertension, unspecified: Secondary | ICD-10-CM | POA: Diagnosis present

## 2023-09-15 DIAGNOSIS — E538 Deficiency of other specified B group vitamins: Secondary | ICD-10-CM | POA: Diagnosis present

## 2023-09-15 DIAGNOSIS — K219 Gastro-esophageal reflux disease without esophagitis: Secondary | ICD-10-CM | POA: Diagnosis present

## 2023-09-15 DIAGNOSIS — D75839 Thrombocytosis, unspecified: Secondary | ICD-10-CM | POA: Insufficient documentation

## 2023-09-15 DIAGNOSIS — R0600 Dyspnea, unspecified: Principal | ICD-10-CM

## 2023-09-15 DIAGNOSIS — D72829 Elevated white blood cell count, unspecified: Secondary | ICD-10-CM | POA: Diagnosis present

## 2023-09-15 DIAGNOSIS — Z860100 Personal history of colon polyps, unspecified: Secondary | ICD-10-CM

## 2023-09-15 DIAGNOSIS — R771 Abnormality of globulin: Secondary | ICD-10-CM

## 2023-09-15 DIAGNOSIS — D509 Iron deficiency anemia, unspecified: Secondary | ICD-10-CM | POA: Diagnosis present

## 2023-09-15 DIAGNOSIS — Z79899 Other long term (current) drug therapy: Secondary | ICD-10-CM | POA: Diagnosis not present

## 2023-09-15 DIAGNOSIS — J918 Pleural effusion in other conditions classified elsewhere: Secondary | ICD-10-CM | POA: Diagnosis present

## 2023-09-15 DIAGNOSIS — K921 Melena: Secondary | ICD-10-CM | POA: Diagnosis present

## 2023-09-15 DIAGNOSIS — E66811 Obesity, class 1: Secondary | ICD-10-CM | POA: Diagnosis present

## 2023-09-15 DIAGNOSIS — Z86711 Personal history of pulmonary embolism: Secondary | ICD-10-CM | POA: Diagnosis not present

## 2023-09-15 DIAGNOSIS — D75838 Other thrombocytosis: Secondary | ICD-10-CM | POA: Diagnosis present

## 2023-09-15 DIAGNOSIS — R7989 Other specified abnormal findings of blood chemistry: Secondary | ICD-10-CM | POA: Diagnosis not present

## 2023-09-15 DIAGNOSIS — I2602 Saddle embolus of pulmonary artery with acute cor pulmonale: Secondary | ICD-10-CM | POA: Diagnosis not present

## 2023-09-15 LAB — TROPONIN I (HIGH SENSITIVITY)
Troponin I (High Sensitivity): 338 ng/L (ref <18)
Troponin I (High Sensitivity): 324 ng/L (ref <18)

## 2023-09-15 LAB — COMPREHENSIVE METABOLIC PANEL WITH GFR
ALT: 16 U/L (ref 0–44)
AST: 12 U/L — ABNORMAL LOW (ref 15–41)
Albumin: 2.8 g/dL — ABNORMAL LOW (ref 3.5–5.0)
Alkaline Phosphatase: 81 U/L (ref 38–126)
Anion gap: 12 (ref 5–15)
BUN: 16 mg/dL (ref 8–23)
CO2: 23 mmol/L (ref 22–32)
Calcium: 8.9 mg/dL (ref 8.9–10.3)
Chloride: 102 mmol/L (ref 98–111)
Creatinine, Ser: 0.95 mg/dL (ref 0.61–1.24)
GFR, Estimated: >60 mL/min (ref >60)
Glucose, Bld: 137 mg/dL — ABNORMAL HIGH (ref 70–99)
Potassium: 4.1 mmol/L (ref 3.5–5.1)
Sodium: 137 mmol/L (ref 135–145)
Total Bilirubin: 0.7 mg/dL (ref 0.0–1.2)
Total Protein: 8.5 g/dL — ABNORMAL HIGH (ref 6.5–8.1)

## 2023-09-15 LAB — CBC WITH DIFFERENTIAL/PLATELET
Abs Immature Granulocytes: 0.09 10*3/uL — ABNORMAL HIGH (ref 0.00–0.07)
Basophils Absolute: 0 10*3/uL (ref 0.0–0.1)
Basophils Relative: 0 %
Eosinophils Absolute: 0 10*3/uL (ref 0.0–0.5)
Eosinophils Relative: 0 %
HCT: 30.2 % — ABNORMAL LOW (ref 39.0–52.0)
Hemoglobin: 7.7 g/dL — ABNORMAL LOW (ref 13.0–17.0)
Immature Granulocytes: 1 %
Lymphocytes Relative: 6 %
Lymphs Abs: 0.8 10*3/uL (ref 0.7–4.0)
MCH: 16.7 pg — ABNORMAL LOW (ref 26.0–34.0)
MCHC: 25.5 g/dL — ABNORMAL LOW (ref 30.0–36.0)
MCV: 65.7 fL — ABNORMAL LOW (ref 80.0–100.0)
Monocytes Absolute: 0.7 10*3/uL (ref 0.1–1.0)
Monocytes Relative: 5 %
Neutro Abs: 12.3 10*3/uL — ABNORMAL HIGH (ref 1.7–7.7)
Neutrophils Relative %: 88 %
Platelets: 692 10*3/uL — ABNORMAL HIGH (ref 150–400)
RBC: 4.6 MIL/uL (ref 4.22–5.81)
RDW: 23.3 % — ABNORMAL HIGH (ref 11.5–15.5)
WBC: 13.9 10*3/uL — ABNORMAL HIGH (ref 4.0–10.5)
nRBC: 0.1 % (ref 0.0–0.2)

## 2023-09-15 LAB — PROTIME-INR
INR: 1.2 (ref 0.8–1.2)
Prothrombin Time: 15.1 s (ref 11.4–15.2)

## 2023-09-15 LAB — I-STAT CG4 LACTIC ACID, ED
Lactic Acid, Venous: 2 mmol/L (ref 0.5–1.9)
Lactic Acid, Venous: 1.8 mmol/L (ref 0.5–1.9)

## 2023-09-15 LAB — ABO/RH: ABO/RH(D): A POS

## 2023-09-15 LAB — MRSA NEXT GEN BY PCR, NASAL: MRSA by PCR Next Gen: NOT DETECTED

## 2023-09-15 LAB — POC OCCULT BLOOD, ED: Fecal Occult Bld: NEGATIVE

## 2023-09-15 LAB — BRAIN NATRIURETIC PEPTIDE: B Natriuretic Peptide: 578.6 pg/mL — ABNORMAL HIGH (ref 0.0–100.0)

## 2023-09-15 LAB — HEPARIN LEVEL (UNFRACTIONATED): Heparin Unfractionated: 0.37 [IU]/mL (ref 0.30–0.70)

## 2023-09-15 MED ORDER — IOHEXOL 350 MG/ML SOLN
75.0000 mL | Freq: Once | INTRAVENOUS | Status: AC | PRN
  Administered 2023-09-15: 75 mL via INTRAVENOUS

## 2023-09-15 MED ORDER — CHLORHEXIDINE GLUCONATE CLOTH 2 % EX PADS
6.0000 | MEDICATED_PAD | Freq: Every day | CUTANEOUS | Status: DC
  Administered 2023-09-16 – 2023-09-18 (×3): 6 via TOPICAL

## 2023-09-15 MED ORDER — SODIUM CHLORIDE 0.9 % IV SOLN
500.0000 mg | Freq: Once | INTRAVENOUS | Status: AC
  Administered 2023-09-15: 500 mg via INTRAVENOUS
  Filled 2023-09-15: qty 5

## 2023-09-15 MED ORDER — ACETAMINOPHEN 650 MG RE SUPP: 650.0000 mg | Freq: Four times a day (QID) | RECTAL | Status: DC | PRN

## 2023-09-15 MED ORDER — HEPARIN (PORCINE) 25000 UT/250ML-% IV SOLN
2600.0000 [IU]/h | INTRAVENOUS | Status: DC
  Administered 2023-09-15: 1600 [IU]/h via INTRAVENOUS
  Administered 2023-09-16: 1800 [IU]/h via INTRAVENOUS
  Administered 2023-09-16: 1600 [IU]/h via INTRAVENOUS
  Administered 2023-09-17: 2450 [IU]/h via INTRAVENOUS
  Administered 2023-09-17: 2300 [IU]/h via INTRAVENOUS
  Administered 2023-09-18: 2450 [IU]/h via INTRAVENOUS
  Filled 2023-09-15 (×6): qty 250

## 2023-09-15 MED ORDER — ORAL CARE MOUTH RINSE: 15.0000 mL | OROMUCOSAL | Status: DC | PRN

## 2023-09-15 MED ORDER — IOHEXOL 300 MG/ML  SOLN
75.0000 mL | Freq: Once | INTRAMUSCULAR | Status: DC | PRN
Start: 2023-09-15 — End: 2023-09-15

## 2023-09-15 MED ORDER — POLYETHYLENE GLYCOL 3350 17 G PO PACK: 17.0000 g | PACK | Freq: Every day | ORAL | Status: DC | PRN

## 2023-09-15 MED ORDER — ACETAMINOPHEN 325 MG PO TABS
650.0000 mg | ORAL_TABLET | Freq: Four times a day (QID) | ORAL | Status: DC | PRN
  Administered 2023-09-16 – 2023-09-17 (×2): 650 mg via ORAL
  Filled 2023-09-15 (×2): qty 2

## 2023-09-15 MED ORDER — SODIUM CHLORIDE 0.9% FLUSH
3.0000 mL | Freq: Two times a day (BID) | INTRAVENOUS | Status: DC
  Administered 2023-09-15 – 2023-09-19 (×6): 3 mL via INTRAVENOUS

## 2023-09-15 MED ORDER — HEPARIN BOLUS VIA INFUSION
6000.0000 [IU] | INTRAVENOUS | Status: AC
  Administered 2023-09-15: 6000 [IU] via INTRAVENOUS
  Filled 2023-09-15: qty 6000

## 2023-09-15 MED ORDER — SODIUM CHLORIDE 0.9 % IV SOLN
1.0000 g | Freq: Once | INTRAVENOUS | Status: AC
  Administered 2023-09-15: 1 g via INTRAVENOUS
  Filled 2023-09-15: qty 10

## 2023-09-15 MED ORDER — PANTOPRAZOLE SODIUM 40 MG PO TBEC
40.0000 mg | DELAYED_RELEASE_TABLET | Freq: Every day | ORAL | Status: DC
  Administered 2023-09-15 – 2023-09-16 (×2): 40 mg via ORAL
  Filled 2023-09-15 (×2): qty 1

## 2023-09-15 NOTE — Assessment & Plan Note (Deleted)
 Rectal exam done by ER doc was guaic negative per report. No evidence of active bleeding at this time. Will check anemia work up including hapto globin level in am. Type and screen already sent by ER attendig.

## 2023-09-15 NOTE — Consult Note (Addendum)
 NAME:  Jeffrey Potter, MRN:  [id number], DOB:  02/28/1961, LOS: 0 ADMISSION DATE:  09/15/2023, CONSULTATION DATE:  09/15/23 REFERRING MD:  Dr Rodena Medin, CHIEF COMPLAINT:  pulmonary embolism   History of Present Illness:  Patient came in with worsening shortness of breath Has had shortness of breath for about 2 weeks, some cough, chest discomfort on the right side Symptoms did settle down after about 4 to 5 days. Last night 4 /3/25 had chest pain, lightheaded, shortness of breath. Not lightheaded today When seen for a visit with GI for evaluation for anemia, noted to have iron deficiency anemia by primary doctor sometime in December  Evaluation in the ED-noted to have PE with right heart strain  Denies any history of heart disease, no history of lung disease, never smoker No recent surgery No recent travel  Significant history of GI interventions, S rings, food impactions - While in the GI office today was noted to be hypotensive, low oxygen sats of 88%  Pertinent  Medical History   Past Medical History:  Diagnosis Date   GERD (gastroesophageal reflux disease)    High cholesterol    High cholesterol    Hypertension    Significant Hospital Events: Including procedures, antibiotic start and stop dates in addition to other pertinent events   CT chest 09/15/2023-pulmonary embolism, right heart strain, interventricular septum flattening.  Significant clot burden.  Interim History / Subjective:  Shortness of breath, chest discomfort Not currently lightheaded No sensation of passing out when he is trying to be active  Objective   Blood pressure (!) 154/89, pulse 92, temperature 97.9 F (36.6 C), temperature source Oral, resp. rate 18, height 5\' 10"  (1.778 m), weight 98.9 kg, SpO2 98%.        Intake/Output Summary (Last 24 hours) at 09/15/2023 1856 Last data filed at 09/15/2023 1826 Gross per 24 hour  Intake 80.44 ml  Output --  Net 80.44 ml   Filed Weights   09/15/23 1555  Weight:  98.9 kg    Examination: General: Middle-age, does not appear to be in distress HENT: Moist oral mucosa Lungs: Decreased air entry at the bases bilaterally Cardiovascular: S1-S2 appreciated soft Abdomen: Soft, bowel sounds appreciated Extremities: No clubbing, no edema, no tenderness of his lower extremities, no calf tenderness Neuro: Awake alert oriented x 3 nonfocal GU: Fair output  Resolved Hospital Problem list     Assessment & Plan:  Pulmonary embolism with right heart strain, enzyme leak Submassive PE, intermediate risk - Hemodynamics are stable at present - Requiring oxygen supplementation  Ordered echocardiogram Venous Dopplers  Will ask interventional radiology to take a look because of his large clot burden and likely presence of pulmonary hypertension  Started on heparin - Continue heparin  Anemia - Previous counts not available but aware of anemia since about December - Noted to be iron deficiency anemia - Need to monitor closely for any evidence of bleeding  Antibiotic coverage for possible pneumonia though infiltrate at the base may be infarct related  Pleural effusion likely related to his pulmonary embolism  Risk of decompensation is significant  Can admit to hospitalist service stepdown unit at [facility] (right click and "Reselect all SmartList Selections" daily)   Diet/type: Regular consistency (see orders) DVT prophylaxis systemic heparin Pressure ulcer(s): N/A GI prophylaxis: PPI Lines: N/A Foley:  N/A Code Status:  full code Last date of multidisciplinary goals of care discussion [pending]  Labs   CBC: Recent Labs  Lab 09/15/23 1607  WBC 13.9*  NEUTROABS 12.3*  HGB 7.7*  HCT 30.2*  MCV 65.7*  PLT 692*    Basic Metabolic Panel: Recent Labs  Lab 09/15/23 1607  NA 137  K 4.1  CL 102  CO2 23  GLUCOSE 137*  BUN 16  CREATININE 0.95  CALCIUM 8.9   GFR: Estimated Creatinine Clearance: 92.7 mL/min (by C-G formula  based on SCr of 0.95 mg/dL). Recent Labs  Lab 09/15/23 1607 09/15/23 1628 09/15/23 1804  WBC 13.9*  --   --   LATICACIDVEN  --  2.0* 1.8    Liver Function Tests: Recent Labs  Lab 09/15/23 1607  AST 12*  ALT 16  ALKPHOS 81  BILITOT 0.7  PROT 8.5*  ALBUMIN 2.8*   No results for input(s): "LIPASE", "AMYLASE" in the last 168 hours. No results for input(s): "AMMONIA" in the last 168 hours.  ABG No results found for: "PHART", "PCO2ART", "PO2ART", "HCO3", "TCO2", "ACIDBASEDEF", "O2SAT"   Coagulation Profile: Recent Labs  Lab 09/15/23 1607  INR 1.2    Cardiac Enzymes: No results for input(s): "CKTOTAL", "CKMB", "CKMBINDEX", "TROPONINI" in the last 168 hours.  HbA1C: No results found for: "HGBA1C"  CBG: No results for input(s): "GLUCAP" in the last 168 hours.  Review of Systems:   Shortness of breath  Past Medical History:  He,  has a past medical history of GERD (gastroesophageal reflux disease), High cholesterol, High cholesterol, and Hypertension.   Surgical History:   Past Surgical History:  Procedure Laterality Date   BALLOON DILATION N/A 10/10/2017   Procedure: BALLOON DILATION;  Surgeon: Beverley Fiedler, MD;  Location: [facility];  Service: Gastroenterology;  Laterality: N/A;   ESOPHAGOGASTRODUODENOSCOPY N/A 08/18/2017   Procedure: ESOPHAGOGASTRODUODENOSCOPY (EGD);  Surgeon: Beverley Fiedler, MD;  Location: [facility];  Service: Gastroenterology;  Laterality: N/A;   ESOPHAGOGASTRODUODENOSCOPY N/A 09/17/2017   Procedure: ESOPHAGOGASTRODUODENOSCOPY (EGD);  Surgeon: Jeani Hawking, MD;  Location: [facility];  Service: Endoscopy;  Laterality: N/A;   ESOPHAGOGASTRODUODENOSCOPY N/A 10/08/2018   Procedure: ESOPHAGOGASTRODUODENOSCOPY (EGD);  Surgeon: Benancio Deeds, MD;  Location: [facility];  Service: Gastroenterology;  Laterality: N/A;   ESOPHAGOGASTRODUODENOSCOPY N/A 09/08/2019   Procedure: ESOPHAGOGASTRODUODENOSCOPY (EGD);  Surgeon: Hilarie Fredrickson, MD;   Location: [facility];  Service: Endoscopy;  Laterality: N/A;   ESOPHAGOGASTRODUODENOSCOPY (EGD) WITH PROPOFOL N/A 10/10/2017   Procedure: ESOPHAGOGASTRODUODENOSCOPY (EGD) WITH PROPOFOL;  Surgeon: Beverley Fiedler, MD;  Location: [facility];  Service: Gastroenterology;  Laterality: N/A;  with biopsy of gastric polyps   FOREIGN BODY REMOVAL  10/08/2018   Procedure: FOREIGN BODY REMOVAL;  Surgeon: Benancio Deeds, MD;  Location: [facility];  Service: Gastroenterology;;  Food Impaction   FOREIGN BODY REMOVAL  09/08/2019   Procedure: FOREIGN BODY REMOVAL;  Surgeon: Hilarie Fredrickson, MD;  Location: [facility];  Service: Endoscopy;;     Social History:   reports that he has never smoked. He has never used smokeless tobacco. He reports current alcohol use. He reports that he does not use drugs.   Family History:  His family history includes Cancer in his father.   Allergies No Known Allergies   The patient is critically ill with multiple organ systems failure and requires high complexity decision making for assessment and support, frequent evaluation and titration of therapies, application of advanced monitoring technologies and extensive interpretation of multiple databases. Critical Care Time devoted to patient care services described in this note independent of APP/resident time (if applicable)  is 31 minutes.   Virl Diamond MD [place]  Pulmonary Critical Care Personal pager: See Amion If unanswered, please page CCM On-call: #[phone]

## 2023-09-15 NOTE — Progress Notes (Addendum)
 Continue anticoagulation  Discussed with IR  Concern with pulmonary infarction, severe anemia  Continue to monitor closely  Echo with normal right-sided pressures

## 2023-09-15 NOTE — Progress Notes (Signed)
 PHARMACY - ANTICOAGULATION CONSULT NOTE  Pharmacy Consult for Heparin Indication: pulmonary embolus  No Known Allergies  Patient Measurements: Height: 5\' 10"  (177.8 cm) Weight: 98.9 kg (218 lb) IBW/kg (Calculated) : 73 HEPARIN DW (KG): 93.5  Vital Signs: Temp: 97.9 F (36.6 C) (04/04 1554) Temp Source: Oral (04/04 1554) BP: 154/89 (04/04 1730) Pulse Rate: 92 (04/04 1730)  Labs: Recent Labs    09/15/23 1607  HGB 7.7*  HCT 30.2*  PLT 692*  LABPROT 15.1  INR 1.2  CREATININE 0.95  TROPONINIHS 338*    Estimated Creatinine Clearance: 92.7 mL/min (by C-G formula based on SCr of 0.95 mg/dL).   Medical History: Past Medical History:  Diagnosis Date   GERD (gastroesophageal reflux disease)    High cholesterol    High cholesterol    Hypertension     Medications:  Scheduled:  Infusions:   azithromycin (ZITHROMAX) 500 mg in sodium chloride 0.9 % 250 mL IVPB 500 mg (09/15/23 1753)    Assessment: 84 yoM presents to ED on 4/4 with worsening SOB x2 weeks.  CTa positive for PE. Pharmacy is consulted to dose Heparin IV.  No prior to admission anticoagulation noted Baseline Hgb 7.7, Plt 692k SCr < 1   Goal of Therapy:  Heparin level 0.3-0.7 units/ml Monitor platelets by anticoagulation protocol: Yes   Plan:  Give heparin 6000 units bolus IV x 1 Start heparin IV infusion at 1600 units/hr Heparin level 6 hours after starting Daily heparin level and CBC   Lynann Beaver PharmD, BCPS [facility] [phone] 09/15/2023 6:37 PM

## 2023-09-15 NOTE — H&P (Signed)
 History and Physical    Patient: Jeffrey Potter WGN:[id number] DOB: 08/18/1960 DOA: 09/15/2023 DOS: the patient was seen and examined on 09/15/2023 PCP: Garlan Fillers, MD  Patient coming from: Home  Chief Complaint:  Chief Complaint  Patient presents with   Shortness of Breath   HPI: Jeffrey Potter is a 63 y.o. male with medical history significant of anemia. However, no known chronic resp issues.  Patient was in his usual state health health till about 5 or 7 days ago when he reports an insidious sensation of shortness of breath while walking at a fast pace.  There was a dry cough.  Patient however did not make much of it.  There was no fever chest pain or expectoration.  No leg swelling.  However over the last 24 hours patient's sensation of shortness of breath really got worse.  Patient presented to [facility] GI/PCP today and was noted to be hypoxic in the clinic and sent to the ER.  Patient hypoxemia confirmed in the ER requiring 2 L/min of supplementary oxygen.  Otherwise blood pressure okay heart rate 90.  CAT scan of the chest revealed pulmonary emboli with possible infarction and effusion.  See report below.  Patient stabilized on 2 L/min of supplementary oxygen at this time offering no other complaints.  Denies smoking any long car rides or immobilization or plane ride.  Denies any lower extremity trauma. Review of Systems: As mentioned in the history of present illness. All other systems reviewed and are negative. Past Medical History:  Diagnosis Date   GERD (gastroesophageal reflux disease)    High cholesterol    High cholesterol    Hypertension    Past Surgical History:  Procedure Laterality Date   BALLOON DILATION N/A 10/10/2017   Procedure: BALLOON DILATION;  Surgeon: Beverley Fiedler, MD;  Location: [facility];  Service: Gastroenterology;  Laterality: N/A;   ESOPHAGOGASTRODUODENOSCOPY N/A 08/18/2017   Procedure: ESOPHAGOGASTRODUODENOSCOPY (EGD);  Surgeon: Beverley Fiedler, MD;   Location: [facility];  Service: Gastroenterology;  Laterality: N/A;   ESOPHAGOGASTRODUODENOSCOPY N/A 09/17/2017   Procedure: ESOPHAGOGASTRODUODENOSCOPY (EGD);  Surgeon: Jeani Hawking, MD;  Location: [facility];  Service: Endoscopy;  Laterality: N/A;   ESOPHAGOGASTRODUODENOSCOPY N/A 10/08/2018   Procedure: ESOPHAGOGASTRODUODENOSCOPY (EGD);  Surgeon: Benancio Deeds, MD;  Location: [facility];  Service: Gastroenterology;  Laterality: N/A;   ESOPHAGOGASTRODUODENOSCOPY N/A 09/08/2019   Procedure: ESOPHAGOGASTRODUODENOSCOPY (EGD);  Surgeon: Hilarie Fredrickson, MD;  Location: [facility];  Service: Endoscopy;  Laterality: N/A;   ESOPHAGOGASTRODUODENOSCOPY (EGD) WITH PROPOFOL N/A 10/10/2017   Procedure: ESOPHAGOGASTRODUODENOSCOPY (EGD) WITH PROPOFOL;  Surgeon: Beverley Fiedler, MD;  Location: [facility];  Service: Gastroenterology;  Laterality: N/A;  with biopsy of gastric polyps   FOREIGN BODY REMOVAL  10/08/2018   Procedure: FOREIGN BODY REMOVAL;  Surgeon: Benancio Deeds, MD;  Location: [facility];  Service: Gastroenterology;;  Food Impaction   FOREIGN BODY REMOVAL  09/08/2019   Procedure: FOREIGN BODY REMOVAL;  Surgeon: Hilarie Fredrickson, MD;  Location: [facility];  Service: Endoscopy;;   Social History:  reports that he has never smoked. He has never used smokeless tobacco. He reports current alcohol use. He reports that he does not use drugs.  No Known Allergies  Family History  Problem Relation Age of Onset   Cancer Father     Prior to Admission medications   Medication Sig Start Date End Date Taking? Authorizing Provider  amLODipine (NORVASC) 5 MG tablet Take 5 mg by mouth in the morning.  [provider]  iron polysaccharides (NIFEREX) 150 MG capsule Take 150 mg by mouth once a week. Take 1 capsule 3 times a week 07/05/23   [provider]  lovastatin (MEVACOR) 20 MG tablet Take 40 mg by mouth in the morning.    [provider]  Omega-3 Fatty Acids (FISH  OIL) 1000 MG CAPS Take 1 capsule by mouth daily in the afternoon. 09/03/19   [provider]  omeprazole (PRILOSEC OTC) 20 MG tablet Take 20 mg by mouth in the morning.    [provider]  potassium chloride SA (KLOR-CON M) 20 MEQ tablet Take 20 mEq by mouth in the morning.    [provider]    Physical Exam: Vitals:   09/15/23 1730 09/15/23 1900 09/15/23 1915 09/15/23 2001  BP: (!) 154/89 131/86 (!) 140/86   Pulse: 92 92 92   Resp: 18 14 18    Temp:    98.3 F (36.8 C)  TempSrc:    Oral  SpO2: 98% 94% 95%   Weight:      Height:       General: Alert awake oriented x 3 no distress on 2 L/min of supplementary oxygen.  Sister at bedside.  Who is the healthcare point of contact per scanned report. Respiratory exam: Good excursion right basilar crackles otherwise no adventitious sounds heard including no wheezes Cardiovascular Sam S1-S2 normal Abdomen all quadrants soft nontender Extremities warm without edema. Data Reviewed:  Labs on Admission:  Results for orders placed or performed during the hospital encounter of 09/15/23 (from the past 24 hours)  Troponin I (High Sensitivity)     Status: Abnormal   Collection Time: 09/15/23  4:07 PM  Result Value Ref Range   Troponin I (High Sensitivity) 338 (HH) <18 ng/L  Comprehensive metabolic panel     Status: Abnormal   Collection Time: 09/15/23  4:07 PM  Result Value Ref Range   Sodium 137 135 - 145 mmol/L   Potassium 4.1 3.5 - 5.1 mmol/L   Chloride 102 98 - 111 mmol/L   CO2 23 22 - 32 mmol/L   Glucose, Bld 137 (H) 70 - 99 mg/dL   BUN 16 8 - 23 mg/dL   Creatinine, Ser 1.19 0.61 - 1.24 mg/dL   Calcium 8.9 8.9 - 14.7 mg/dL   Total Protein 8.5 (H) 6.5 - 8.1 g/dL   Albumin 2.8 (L) 3.5 - 5.0 g/dL   AST 12 (L) 15 - 41 U/L   ALT 16 0 - 44 U/L   Alkaline Phosphatase 81 38 - 126 U/L   Total Bilirubin 0.7 0.0 - 1.2 mg/dL   GFR, Estimated >82 >95 mL/min   Anion gap 12 5 - 15  Brain natriuretic peptide     Status:  Abnormal   Collection Time: 09/15/23  4:07 PM  Result Value Ref Range   B Natriuretic Peptide 578.6 (H) 0.0 - 100.0 pg/mL  CBC with Differential     Status: Abnormal   Collection Time: 09/15/23  4:07 PM  Result Value Ref Range   WBC 13.9 (H) 4.0 - 10.5 K/uL   RBC 4.60 4.22 - 5.81 MIL/uL   Hemoglobin 7.7 (L) 13.0 - 17.0 g/dL   HCT 62.1 (L) 30.8 - 65.7 %   MCV 65.7 (L) 80.0 - 100.0 fL   MCH 16.7 (L) 26.0 - 34.0 pg   MCHC 25.5 (L) 30.0 - 36.0 g/dL   RDW 84.6 (H) 96.2 - 95.2 %   Platelets 692 (H) 150 -  400 K/uL   nRBC 0.1 0.0 - 0.2 %   Neutrophils Relative % 88 %   Neutro Abs 12.3 (H) 1.7 - 7.7 K/uL   Lymphocytes Relative 6 %   Lymphs Abs 0.8 0.7 - 4.0 K/uL   Monocytes Relative 5 %   Monocytes Absolute 0.7 0.1 - 1.0 K/uL   Eosinophils Relative 0 %   Eosinophils Absolute 0.0 0.0 - 0.5 K/uL   Basophils Relative 0 %   Basophils Absolute 0.0 0.0 - 0.1 K/uL   Immature Granulocytes 1 %   Abs Immature Granulocytes 0.09 (H) 0.00 - 0.07 K/uL   Polychromasia PRESENT    Ovalocytes PRESENT   Protime-INR     Status: None   Collection Time: 09/15/23  4:07 PM  Result Value Ref Range   Prothrombin Time 15.1 11.4 - 15.2 seconds   INR 1.2 0.8 - 1.2  Type and screen [facility]     Status: None   Collection Time: 09/15/23  4:07 PM  Result Value Ref Range   ABO/RH(D) A POS    Antibody Screen NEG    Sample Expiration      09/18/2023,2359 Performed at [facility], [address], [place], [place] [address]   I-Stat Lactic Acid     Status: Abnormal   Collection Time: 09/15/23  4:28 PM  Result Value Ref Range   Lactic Acid, Venous 2.0 (HH) 0.5 - 1.9 mmol/L  POC occult blood, ED     Status: None   Collection Time: 09/15/23  5:27 PM  Result Value Ref Range   Fecal Occult Bld NEGATIVE NEGATIVE  Troponin I (High Sensitivity)     Status: Abnormal   Collection Time: 09/15/23  5:53 PM  Result Value Ref Range   Troponin I (High Sensitivity) 324 (HH) <18 ng/L   I-Stat Lactic Acid     Status: None   Collection Time: 09/15/23  6:04 PM  Result Value Ref Range   Lactic Acid, Venous 1.8 0.5 - 1.9 mmol/L   Basic Metabolic Panel: Recent Labs  Lab 09/15/23 1607  NA 137  K 4.1  CL 102  CO2 23  GLUCOSE 137*  BUN 16  CREATININE 0.95  CALCIUM 8.9   Liver Function Tests: Recent Labs  Lab 09/15/23 1607  AST 12*  ALT 16  ALKPHOS 81  BILITOT 0.7  PROT 8.5*  ALBUMIN 2.8*   No results for input(s): "LIPASE", "AMYLASE" in the last 168 hours. No results for input(s): "AMMONIA" in the last 168 hours. CBC: Recent Labs  Lab 09/15/23 1607  WBC 13.9*  NEUTROABS 12.3*  HGB 7.7*  HCT 30.2*  MCV 65.7*  PLT 692*   Cardiac Enzymes: Recent Labs  Lab 09/15/23 1607 09/15/23 1753  TROPONINIHS 338* 324*    BNP (last 3 results) No results for input(s): "PROBNP" in the last 8760 hours. CBG: No results for input(s): "GLUCAP" in the last 168 hours.  Radiological Exams on Admission:  CT Angio Chest PE W and/or Wo Contrast Result Date: 09/15/2023 CLINICAL DATA:  Shortness of breath.  Hypoxia and fatigue EXAM: CT ANGIOGRAPHY CHEST WITH CONTRAST TECHNIQUE: Multidetector CT imaging of the chest was performed using the standard protocol during bolus administration of intravenous contrast. Multiplanar CT image reconstructions and MIPs were obtained to evaluate the vascular anatomy. RADIATION DOSE REDUCTION: This exam was performed according to the departmental dose-optimization program which includes automated exposure control, adjustment of the mA and/or kV according to patient size and/or use of iterative  reconstruction technique. CONTRAST:  75mL OMNIPAQUE IOHEXOL 350 MG/ML SOLN COMPARISON:  X-ray earlier 09/15/2023 and older FINDINGS: Cardiovascular: Bilateral pulmonary emboli identified including some central clot within the main left pulmonary artery. Large areas of thrombus seen in the central lobar vessels on the left as well as segmental vessels and  peripheral. There is also central clot within the pulmonary artery extending to the right lower lobe, lobar, segmental. More mild areas thrombi in the right upper lobe. Large clot burden. There is some flattening of the intraventricular septum. Please correlate for other clinical findings of right heart strain. Heart itself is nonenlarged. There is no reflux of contrast into the intrahepatic IVC. Trace pericardial fluid. Thoracic aorta has a normal course and caliber with slight partially calcified plaque. There is a bovine type aortic arch, normal variant. Mediastinum/Nodes: Large hiatal hernia. Slightly patulous esophagus. Preserved thyroid gland. No specific abnormal lymph node enlargement identified in the axillary regions, hilum or mediastinum. Only a few small less than 1 cm size right hilar nodes are seen and mediastinal nodes, not pathologic by size criteria. Lungs/Pleura: There is a small right pleural effusion with some lung base opacities. Infiltrate is possible. With the pulmonary embolus a developing infarct is also in the differential. Recommend simple follow-up. Please correlate with clinical presentation. No pneumothorax. No left-sided effusion or consolidation. Some breathing motion. Upper Abdomen: Adrenal glands are grossly preserved in the upper abdomen. Lobular contour to the spleen, nonspecific. Musculoskeletal: Scattered degenerative changes seen along the spine. Critical Value/emergent results were called by telephone at the time of interpretation on 09/15/2023 at 6:29 pm to provider [facility] , who verbally acknowledged these results. Review of the MIP images confirms the above findings. IMPRESSION: Pulmonary emboli bilaterally including some large central areas. Extensive distribution of thrombus along the lower lobes greater than left upper lobe greater than right upper lobe. There is also some flattening of the intraventricular septum. No reflux of contrast into the intrahepatic IVC.  Please correlate with any clinical evidence of right heart strain. Small right pleural effusion with adjacent lung opacity. This would have a differential including infiltrate. Infarct would also be in the differential with the level of pulmonary emboli. Aortic Atherosclerosis (ICD10-I70.0). Electronically Signed   By: Karen Kays M.D.   On: 09/15/2023 18:31   DG Chest Port 1 View Result Date: 09/15/2023 CLINICAL DATA:  Short of breath, hypoxia, fatigue EXAM: PORTABLE CHEST 1 VIEW COMPARISON:  02/11/2018 FINDINGS: Single frontal view of the chest demonstrates a stable cardiac silhouette. Patchy opacities are seen at the right lung base, which could reflect pneumonia or atelectasis. Left chest is clear. No effusion or pneumothorax. IMPRESSION: 1. Patchy right basilar consolidation, which could be hypoventilatory or related to bronchopneumonia. Electronically Signed   By: Sharlet Salina M.D.   On: 09/15/2023 16:57    chest X-ray  EKG: Independently reviewed. NSR  I/O last 3 completed shifts: In: 80.4 [IV Piggyback:80.4] Out: -  Total I/O In: 250 [IV Piggyback:250] Out: -        Assessment and Plan: * Acute pulmonary embolism (HCC) Bilateral with infarction and hypoxia and effusion present. Started on heparin infusion. Pending USG LE to assess clot burden. No apparent risk factor at this time. With associated troponin elevation. Appreciate PCCM input - pending consultation with IR. But ok to admit to [facility]  S/p single doses of ceftriaxoen and azihtromycin. Will defer further abx therapy at this time.  Outpatient cancer screening as indicated  Anemia Rectal exam done by  ER doc was guaic negative per report. No evidence of active bleeding at this time. Will check anemia work up including hapto globin level in am. Type and screen already sent by ER attendig.  Elevated serum globulin level Check spep   Med rec pending pharmcy input.   Advance Care Planning:   Code Status: Prior full  code.  Consults: pccm involved. See notes.  Family Communication: sister at bedside updated.  Severity of Illness: The appropriate patient status for this patient is INPATIENT. Inpatient status is judged to be reasonable and necessary in order to provide the required intensity of service to ensure the patient's safety. The patient's presenting symptoms, physical exam findings, and initial radiographic and laboratory data in the context of their chronic comorbidities is felt to place them at high risk for further clinical deterioration. Furthermore, it is not anticipated that the patient will be medically stable for discharge from the hospital within 2 midnights of admission.   * I certify that at the point of admission it is my clinical judgment that the patient will require inpatient hospital care spanning beyond 2 midnights from the point of admission due to high intensity of service, high risk for further deterioration and high frequency of surveillance required.*  Author: Nolberto Hanlon, MD 09/15/2023 8:20 PM  For on call review [email or web address].

## 2023-09-15 NOTE — Assessment & Plan Note (Signed)
 Check spep

## 2023-09-15 NOTE — Assessment & Plan Note (Addendum)
 Extensive bilateral pulmonary emboli concerning for submassive pulmonary embolism Elevated troponins of 338 and 324 with echocardiogram today revealing acute severe reduction in right ventricular systolic function Patient was initiated on a heparin upon admission and will likely need lifelong anticoagulation after discharge Case discussed extensively with Dr. Wynona Neat with PCCM and Dr. Loreta Ave with IR , patient to undergo thrombolysis.  Catheter will be placed this afternoon with infusion over the following 12 hours. Providing patient with 1 unit packed red blood cell transfusion in case of bleeding post procedure. Supplemental oxygen for associated acute hypoxic respiratory failure Monitoring closely in stepdown as patient is at high risk of rapid clinical decompensation. Concerning etiology, I am concerned for possible undiagnosed malignancy, possibly GI malignancy considering numerous adenomatous polyps identified in 2000 with patient failing to follow-up for the recommended repeat surveillance colonoscopy in 2023.  SPEP and UPEP and PSA pending.  CT imaging of abdomen pelvis reveals no other obvious malignancy.

## 2023-09-15 NOTE — ED Triage Notes (Signed)
 Pt presents to ED from doctor's office where he went for SOB, found to have SaO2 88% on RA. Pt reports fatigue, lightheadedness, and DOE X 2 days. Recently sick with cold like symptoms.

## 2023-09-15 NOTE — ED Provider Notes (Signed)
 [place] EMERGENCY DEPARTMENT AT [facility] Provider Note   CSN: [id number] Arrival date & time: 09/15/23  1541     History  Chief Complaint  Patient presents with   Shortness of Breath    Jeffrey Potter is a 63 y.o. male.  63 year old male with medical history detailed below presents for evaluation.  Patient arrives from [employer] office.  He was seen there for routine appointment.  Patient reported to GI providers that he has been short of breath for the last 1 to 2 days.  Patient was noted to be satting 88% on room air.  Patient reports increased dyspnea with exertion.  He denies chest pain.  He denies bloody emesis or bloody stool.  He reports that 1 month ago he saw some dark stool.  He denies recurrent episodes of darker colored stool.  Patient does have a history of mild anemia.  He is never been transfused.  GI was concerned about worsening anemia.  Patient complains of dyspnea with exertion.  He complains of mild cough.  He denies fever.  Reports that his dyspnea has been slowly developing over the last 1 week.  He reports increased symptoms x 24 hours.  The history is provided by the patient.       Home Medications Prior to Admission medications   Medication Sig Start Date End Date Taking? Authorizing Provider  amLODipine (NORVASC) 5 MG tablet Take 5 mg by mouth in the morning.    [provider]  iron polysaccharides (NIFEREX) 150 MG capsule Take 150 mg by mouth once a week. Take 1 capsule 3 times a week 07/05/23   [provider]  lovastatin (MEVACOR) 20 MG tablet Take 40 mg by mouth in the morning.    [provider]  Omega-3 Fatty Acids (FISH OIL) 1000 MG CAPS Take 1 capsule by mouth daily in the afternoon. 09/03/19   [provider]  omeprazole (PRILOSEC OTC) 20 MG tablet Take 20 mg by mouth in the morning.    [provider]  potassium chloride SA (KLOR-CON M) 20 MEQ tablet Take 20 mEq by mouth in the morning.     [provider]      Allergies    Patient has no known allergies.    Review of Systems   Review of Systems  All other systems reviewed and are negative.   Physical Exam Updated Vital Signs BP 100/80 (BP Location: Right Arm)   Pulse 98   Temp 97.9 F (36.6 C) (Oral)   Resp (!) 28   Ht 5\' 10"  (1.778 m)   Wt 98.9 kg   SpO2 93%   BMI 31.28 kg/m  Physical Exam Vitals and nursing note reviewed.  Constitutional:      General: He is not in acute distress.    Appearance: Normal appearance. He is well-developed.  HENT:     Head: Normocephalic and atraumatic.  Eyes:     Conjunctiva/sclera: Conjunctivae normal.     Pupils: Pupils are equal, round, and reactive to light.  Cardiovascular:     Rate and Rhythm: Normal rate and regular rhythm.     Heart sounds: Normal heart sounds.  Pulmonary:     Effort: Pulmonary effort is normal. No respiratory distress.     Breath sounds: Normal breath sounds.  Abdominal:     General: There is no distension.     Palpations: Abdomen is soft.     Tenderness: There is no abdominal tenderness.  Musculoskeletal:  General: No deformity. Normal range of motion.     Cervical back: Normal range of motion and neck supple.  Skin:    General: Skin is warm and dry.  Neurological:     General: No focal deficit present.     Mental Status: He is alert and oriented to person, place, and time.     ED Results / Procedures / Treatments   Labs (all labs ordered are listed, but only abnormal results are displayed) Labs Reviewed  COMPREHENSIVE METABOLIC PANEL WITH GFR  BRAIN NATRIURETIC PEPTIDE  CBC WITH DIFFERENTIAL/PLATELET  PROTIME-INR  I-STAT CG4 LACTIC ACID, ED  TYPE AND SCREEN  TROPONIN I (HIGH SENSITIVITY)    EKG EKG Interpretation Date/Time:  Friday September 15 2023 15:53:34 EDT Ventricular Rate:  98 PR Interval:  147 QRS Duration:  92 QT Interval:  385 QTC Calculation: 492 R Axis:   125  Text Interpretation: Sinus  rhythm Probable lateral infarct, age indeterminate Confirmed by Kristine Royal [phone]) on 09/15/2023 3:55:32 PM  Radiology No results found.  Procedures Procedures    Medications Ordered in ED Medications - No data to display  ED Course/ Medical Decision Making/ A&P                                 Medical Decision Making Amount and/or Complexity of Data Reviewed Labs: ordered. Radiology: ordered.  Risk Prescription drug management. Decision regarding hospitalization.    Medical Screen Complete  This patient presented to the ED with complaint of dyspnea.  This complaint involves an extensive number of treatment options. The initial differential diagnosis includes, but is not limited to, pneumonia, pulmonary embolism, etc.  This presentation is: Acute, Chronic, Self-Limited, Previously Undiagnosed, Uncertain Prognosis, Complicated, Systemic Symptoms, and Threat to Life/Bodily Function  Patient is presenting with dyspnea.  Patient found to have submassive PE on imaging.  Patient is hemodynamically stable in the ED.  Heparin drip initiated.  Critical care is aware of case and is consulting.  Hospitalist service to admit.  Co morbidities that complicated the patient's evaluation  See HPI   Additional history obtained: External records from outside sources obtained and reviewed including prior ED visits and prior Inpatient records.    Problem List / ED Course:  Pulmonary Embolus   Disposition:  After consideration of the diagnostic results and the patients response to treatment, I feel that the patent would benefit from admission.   CRITICAL CARE Performed by: Wynetta Fines   Total critical care time: 30 minutes  Critical care time was exclusive of separately billable procedures and treating other patients.  Critical care was necessary to treat or prevent imminent or life-threatening deterioration.  Critical care was time spent personally by me on the  following activities: development of treatment plan with patient and/or surrogate as well as nursing, discussions with consultants, evaluation of patient's response to treatment, examination of patient, obtaining history from patient or surrogate, ordering and performing treatments and interventions, ordering and review of laboratory studies, ordering and review of radiographic studies, pulse oximetry and re-evaluation of patient's condition.          Final Clinical Impression(s) / ED Diagnoses Final diagnoses:  Dyspnea, unspecified type  Pulmonary embolism, other, unspecified chronicity, unspecified whether acute cor pulmonale present [facility])    Rx / DC Orders ED Discharge Orders     None         Wynetta Fines, MD 09/15/23 [phone]

## 2023-09-15 NOTE — Progress Notes (Signed)
 Chief Complaint: Iron deficiency anemia Primary GI MD: Dr. Rhea Belton  HPI: 63 year old male history of S-rings and food impactions, hypertension, and others as listed below presents for evaluation of iron deficiency anemia referred here by PCP  Patient referred by PCP for evaluation of iron deficiency anemia.  No labs were included in referral.  Upon review of labs November 2023 appears he had a mild anemia with Hgb 11.0, MCV 83.2  Patient presents today and during evaluation he is obviously short of breath.  He states he has been short of breath on exertion for at least 2 weeks with intense worsening of the last 24 hours.  He has a "roaring in his head".  Denies chest pain.  Denies melena/hematochezia.  2 weeks ago he had an episode of right sided pain but has not had pain since.  Denies dysphagia.  Denies weight loss.  He is unsure what his hemoglobin was at his primary care.    PREVIOUS GI WORKUP   EGD 08/2019 inpatient with Dr. Marina Goodell 1. Acute food impaction status post endoscopic removal  2. GERD with underlying stricture and hiatal hernia  3. Incidental inflammatory appearing polyp( s) , otherwise normal EGD.  EGD inpatient 09/2018 with Dr. Adela Lank - Esophagogastric landmarks identified.  - 6 cm hiatal hernia.  - Food at the gastroesophageal junction. Removal was successful.  - Benign- appearing esophageal stenosis. - Tortuous esophagus.  - A few benign appearing gastric polyps.  Colonoscopy 07/2018 - One 7 mm polyp in the ascending colon, removed with a cold snare. Resected and retrieved.  - One 25 mm polyp in the distal ascending colon, removed with a hot snare. Resected and retieved - One 7 mm polyp in the transverse colon, removed with a cold snare. Resected and retrieved.  - One 6 mm polyp in the descending colon, removed with a cold snare. Resected and retrieved.  - One 5 mm polyp in the sigmoid colon, removed with a cold snare. Resected and retrieved.  - One 10 mm  polyp in the distal sigmoid colon, removed with a hot snare. Resected and retrieved.  - Internal hemorrhoids. - recall 3 years (07/2021)  Diagnosis 1. Surgical [P], ascending colon, polyp - TUBULAR ADENOMA(S) - NEGATIVE FOR HIGH-GRADE DYSPLASIA OR MALIGNANCY 2. Surgical [P], ascending colon, polyp - TUBULAR ADENOMA - NEGATIVE FOR HIGH-GRADE DYSPLASIA OR MALIGNANCY - LATERAL MUCOSAL MARGINS ARE NEGATIVE FOR DYSPALSIA 3. Surgical [P], transverse, descending, polyp (4) - TUBULAR ADENOMA(S) - TUBULOVILLOUS ADENOMA(S) - NEGATIVE FOR HIGH-GRADE DYSPLASIA OR MALIGNANCY  EGD 09/2017 inpatient with Dr. Rhea Belton - Low- grade of narrowing Schatzki ring. Dilated to 15 mm with good result.  - 3 cm hiatal hernia.  - Two 1 cm gastric polyps with adherent heme. Resected and retrieved. Clips were placed.  - Multiple small gastric polyps.  - Nodular gastritis. Biopsied.  - Normal examined duodenum.  EGD 09/2017 inpatient with Dr. Elnoria Howard inpatient - Food in the lower third of the esophagus. Removal was successful.  - Moderate Schatzki ring.  - 3 cm hiatal hernia.  - Multiple gastric polyps.  - Normal examined duodenum.  Past Medical History:  Diagnosis Date   GERD (gastroesophageal reflux disease)    High cholesterol    High cholesterol    Hypertension     Past Surgical History:  Procedure Laterality Date   BALLOON DILATION N/A 10/10/2017   Procedure: BALLOON DILATION;  Surgeon: Beverley Fiedler, MD;  Location: [facility];  Service: Gastroenterology;  Laterality: N/A;   ESOPHAGOGASTRODUODENOSCOPY  N/A 08/18/2017   Procedure: ESOPHAGOGASTRODUODENOSCOPY (EGD);  Surgeon: Beverley Fiedler, MD;  Location: [facility];  Service: Gastroenterology;  Laterality: N/A;   ESOPHAGOGASTRODUODENOSCOPY N/A 09/17/2017   Procedure: ESOPHAGOGASTRODUODENOSCOPY (EGD);  Surgeon: Jeani Hawking, MD;  Location: [facility];  Service: Endoscopy;  Laterality: N/A;   ESOPHAGOGASTRODUODENOSCOPY N/A 10/08/2018   Procedure:  ESOPHAGOGASTRODUODENOSCOPY (EGD);  Surgeon: Benancio Deeds, MD;  Location: [facility];  Service: Gastroenterology;  Laterality: N/A;   ESOPHAGOGASTRODUODENOSCOPY N/A 09/08/2019   Procedure: ESOPHAGOGASTRODUODENOSCOPY (EGD);  Surgeon: Hilarie Fredrickson, MD;  Location: [facility];  Service: Endoscopy;  Laterality: N/A;   ESOPHAGOGASTRODUODENOSCOPY (EGD) WITH PROPOFOL N/A 10/10/2017   Procedure: ESOPHAGOGASTRODUODENOSCOPY (EGD) WITH PROPOFOL;  Surgeon: Beverley Fiedler, MD;  Location: [facility];  Service: Gastroenterology;  Laterality: N/A;  with biopsy of gastric polyps   FOREIGN BODY REMOVAL  10/08/2018   Procedure: FOREIGN BODY REMOVAL;  Surgeon: Benancio Deeds, MD;  Location: [facility];  Service: Gastroenterology;;  Food Impaction   FOREIGN BODY REMOVAL  09/08/2019   Procedure: FOREIGN BODY REMOVAL;  Surgeon: Hilarie Fredrickson, MD;  Location: [facility];  Service: Endoscopy;;    Current Outpatient Medications  Medication Sig Dispense Refill   amLODipine (NORVASC) 5 MG tablet Take 5 mg by mouth in the morning.     lovastatin (MEVACOR) 20 MG tablet Take 40 mg by mouth in the morning.     Omega-3 Fatty Acids (FISH OIL) 1000 MG CAPS Take 1 capsule by mouth daily in the afternoon.     omeprazole (PRILOSEC OTC) 20 MG tablet Take 20 mg by mouth in the morning.     potassium chloride SA (KLOR-CON M) 20 MEQ tablet Take 20 mEq by mouth in the morning.     No current facility-administered medications for this visit.    Allergies as of 09/15/2023   (No Known Allergies)    Family History  Problem Relation Age of Onset   Cancer Father     Social History   Socioeconomic History   Marital status: Single    Spouse name: Not on file   Number of children: Not on file   Years of education: Not on file   Highest education level: Not on file  Occupational History   Not on file  Tobacco Use   Smoking status: Never   Smokeless tobacco: Never  Substance and Sexual Activity   Alcohol  use: Yes    Comment: occasionally   Drug use: No   Sexual activity: Not on file  Other Topics Concern   Not on file  Social History Narrative   Not on file   Social Drivers of Health   Financial Resource Strain: Not on file  Food Insecurity: No Food Insecurity (05/05/2022)   Hunger Vital Sign    Worried About Running Out of Food in the Last Year: Never true    Ran Out of Food in the Last Year: Never true  Transportation Needs: No Transportation Needs (05/05/2022)   PRAPARE - Administrator, Civil Service (Medical): No    Lack of Transportation (Non-Medical): No  Physical Activity: Not on file  Stress: Not on file  Social Connections: Not on file  Intimate Partner Violence: Not At Risk (05/05/2022)   Humiliation, Afraid, Rape, and Kick questionnaire    Fear of Current or Ex-Partner: No    Emotionally Abused: No    Physically Abused: No    Sexually Abused: No    Review of Systems:    Constitutional: No  weight loss, fever, chills, weakness or fatigue HEENT: Eyes: No change in vision               Ears, Nose, Throat:  No change in hearing or congestion Skin: No rash or itching Cardiovascular: No chest pain, chest pressure or palpitations   Respiratory: No SOB or cough Gastrointestinal: See HPI and otherwise negative Genitourinary: No dysuria or change in urinary frequency Neurological: No headache, dizziness or syncope Musculoskeletal: No new muscle or joint pain Hematologic: No bleeding or bruising Psychiatric: No history of depression or anxiety    Physical Exam:  Vital signs: There were no vitals taken for this visit.  Constitutional: Ill-appearing, obviously short of breath, pale conjunctiva  head:  Normocephalic and atraumatic. Eyes:   PEERL, EOMI. No icterus. Conjunctiva pale Respiratory: Labored, clear, increased respiratory effort Cardiovascular: Tachycardic with regular rhythm Gastrointestinal:  Soft, nondistended, nontender. No rebound or  guarding. Normal bowel sounds. No appreciable masses or hepatomegaly. Rectal:  Not performed.  Msk:  Symmetrical without gross deformities. Without edema, no deformity or joint abnormality.  Neurologic:  Alert and  oriented x4;  grossly normal neurologically.  Skin:   Dry and intact without significant lesions or rashes. Psychiatric: Oriented to person, place and time. Demonstrates good judgement and reason without abnormal affect or behaviors.  RELEVANT LABS AND IMAGING: CBC    Component Value Date/Time   WBC 8.1 05/06/2022 0057   RBC 3.93 (L) 05/06/2022 0057   HGB 11.0 (L) 05/06/2022 0057   HCT 32.7 (L) 05/06/2022 0057   PLT 287 05/06/2022 0057   MCV 83.2 05/06/2022 0057   MCH 28.0 05/06/2022 0057   MCHC 33.6 05/06/2022 0057   RDW 14.5 05/06/2022 0057   LYMPHSABS 1.1 05/05/2022 1653   MONOABS 0.6 05/05/2022 1653   EOSABS 0.1 05/05/2022 1653   BASOSABS 0.1 05/05/2022 1653    CMP     Component Value Date/Time   NA 138 05/06/2022 0057   K 3.9 05/06/2022 0057   CL 104 05/06/2022 0057   CO2 24 05/06/2022 0057   GLUCOSE 145 (H) 05/06/2022 0057   BUN 15 05/06/2022 0057   CREATININE 1.62 (H) 05/06/2022 0057   CALCIUM 9.2 05/06/2022 0057   PROT 7.0 05/05/2022 1653   ALBUMIN 3.4 (L) 05/05/2022 1653   AST 16 05/05/2022 1653   ALT 16 05/05/2022 1653   ALKPHOS 48 05/05/2022 1653   BILITOT 0.4 05/05/2022 1653   GFRNONAA 47 (L) 05/06/2022 0057     Assessment/Plan:   Symptomatic anemia Referred here by PCP for iron deficiency anemia but unsure what his hemoglobin was as the labs were not included.  Last known hemoglobin in 2023 was 11.0.  Short of breath, tachycardic, hypotensive in office and decreased O2 sats of 88%.  Pale conjunctiva.  No overt bleeding. - Referred to ED via EMS for extensive workup and potential blood transfusion  History of colon polyps Colonoscopy in 2020 with multiple tubular adenomas and repeat due in 2023 - Due for repeat colonoscopy.  Timing  TBD.  History of esophageal stricture No dysphagia at this time.  S/p 4 EGDs for food impactions with most recent 2021 With his symptomatic anemia he will likely need EGD/colonoscopy.  Timing TBD.   Lara Mulch [facility] 09/15/2023, 12:32 PM  Cc: Garlan Fillers, MD

## 2023-09-15 NOTE — Progress Notes (Signed)
 PHARMACY - ANTICOAGULATION CONSULT NOTE  Pharmacy Consult for Heparin Indication: pulmonary embolus  No Known Allergies  Patient Measurements: Height: 5\' 10"  (177.8 cm) Weight: 97.2 kg (214 lb 4.6 oz) IBW/kg (Calculated) : 73 HEPARIN DW (KG): 93  Vital Signs: Temp: 98.3 F (36.8 C) (04/04 2001) Temp Source: Oral (04/04 2001) BP: 191/90 (04/04 2230) Pulse Rate: 94 (04/04 2230)  Labs: Recent Labs    09/15/23 1607 09/15/23 1753 09/15/23 2251  HGB 7.7*  --   --   HCT 30.2*  --   --   PLT 692*  --   --   LABPROT 15.1  --   --   INR 1.2  --   --   HEPARINUNFRC  --   --  0.37  CREATININE 0.95  --   --   TROPONINIHS 338* 324*  --     Estimated Creatinine Clearance: 91.9 mL/min (by C-G formula based on SCr of 0.95 mg/dL).   Medical History: Past Medical History:  Diagnosis Date   GERD (gastroesophageal reflux disease)    High cholesterol    High cholesterol    Hypertension     Medications:  Scheduled:   [START ON 09/16/2023] Chlorhexidine Gluconate Cloth  6 each Topical Q0600   pantoprazole  40 mg Oral Daily   sodium chloride flush  3 mL Intravenous Q12H   Infusions:   heparin 1,600 Units/hr (09/15/23 1848)    Assessment: 87 yoM presents to ED on 4/4 with worsening SOB x2 weeks.  CTa positive for PE. Pharmacy is consulted to dose Heparin IV.  No prior to admission anticoagulation noted Baseline Hgb 7.7, Plt 692k SCr < 1  2251 HL 0.37 therapeutic on 1600 units/hr No bleeding noted per RN  Goal of Therapy:  Heparin level 0.3-0.7 units/ml Monitor platelets by anticoagulation protocol: Yes   Plan:  Continue heparin IV infusion at 1600 units/hr Heparin level with am labs Daily heparin level and CBC   Arley Phenix RPh 09/15/2023, 11:37 PM

## 2023-09-16 ENCOUNTER — Inpatient Hospital Stay (HOSPITAL_COMMUNITY): Payer: MEDICAID

## 2023-09-16 ENCOUNTER — Encounter (HOSPITAL_COMMUNITY): Payer: Self-pay

## 2023-09-16 DIAGNOSIS — E782 Mixed hyperlipidemia: Secondary | ICD-10-CM

## 2023-09-16 DIAGNOSIS — K219 Gastro-esophageal reflux disease without esophagitis: Secondary | ICD-10-CM

## 2023-09-16 DIAGNOSIS — I2602 Saddle embolus of pulmonary artery with acute cor pulmonale: Secondary | ICD-10-CM

## 2023-09-16 DIAGNOSIS — J9601 Acute respiratory failure with hypoxia: Secondary | ICD-10-CM

## 2023-09-16 DIAGNOSIS — D75839 Thrombocytosis, unspecified: Secondary | ICD-10-CM

## 2023-09-16 DIAGNOSIS — R7989 Other specified abnormal findings of blood chemistry: Secondary | ICD-10-CM

## 2023-09-16 DIAGNOSIS — I2609 Other pulmonary embolism with acute cor pulmonale: Secondary | ICD-10-CM

## 2023-09-16 DIAGNOSIS — I10 Essential (primary) hypertension: Secondary | ICD-10-CM

## 2023-09-16 DIAGNOSIS — E538 Deficiency of other specified B group vitamins: Secondary | ICD-10-CM

## 2023-09-16 DIAGNOSIS — I82220 Acute embolism and thrombosis of inferior vena cava: Secondary | ICD-10-CM

## 2023-09-16 DIAGNOSIS — D509 Iron deficiency anemia, unspecified: Secondary | ICD-10-CM | POA: Insufficient documentation

## 2023-09-16 DIAGNOSIS — K921 Melena: Secondary | ICD-10-CM

## 2023-09-16 HISTORY — PX: IR US GUIDE VASC ACCESS RIGHT: IMG2390

## 2023-09-16 HISTORY — PX: IR ANGIOGRAM PULMONARY BILATERAL SELECTIVE: IMG664

## 2023-09-16 HISTORY — PX: IR INFUSION THROMBOL ARTERIAL INITIAL (MS): IMG5376

## 2023-09-16 LAB — PSA: Prostatic Specific Antigen: 4.77 ng/mL — ABNORMAL HIGH (ref 0.00–4.00)

## 2023-09-16 LAB — PROTIME-INR
INR: 1.2 (ref 0.8–1.2)
Prothrombin Time: 15 s (ref 11.4–15.2)

## 2023-09-16 LAB — CBC
HCT: 28.6 % — ABNORMAL LOW (ref 39.0–52.0)
HCT: 32.6 % — ABNORMAL LOW (ref 39.0–52.0)
Hemoglobin: 7.4 g/dL — ABNORMAL LOW (ref 13.0–17.0)
Hemoglobin: 8.8 g/dL — ABNORMAL LOW (ref 13.0–17.0)
MCH: 17.1 pg — ABNORMAL LOW (ref 26.0–34.0)
MCH: 18.5 pg — ABNORMAL LOW (ref 26.0–34.0)
MCHC: 25.9 g/dL — ABNORMAL LOW (ref 30.0–36.0)
MCHC: 27 g/dL — ABNORMAL LOW (ref 30.0–36.0)
MCV: 66.1 fL — ABNORMAL LOW (ref 80.0–100.0)
MCV: 68.6 fL — ABNORMAL LOW (ref 80.0–100.0)
Platelets: 606 10*3/uL — ABNORMAL HIGH (ref 150–400)
Platelets: 521 10*3/uL — ABNORMAL HIGH (ref 150–400)
RBC: 4.33 MIL/uL (ref 4.22–5.81)
RBC: 4.75 MIL/uL (ref 4.22–5.81)
RDW: 23.1 % — ABNORMAL HIGH (ref 11.5–15.5)
RDW: 24.7 % — ABNORMAL HIGH (ref 11.5–15.5)
WBC: 11.7 10*3/uL — ABNORMAL HIGH (ref 4.0–10.5)
WBC: 12.4 10*3/uL — ABNORMAL HIGH (ref 4.0–10.5)
nRBC: 0.3 % — ABNORMAL HIGH (ref 0.0–0.2)
nRBC: 0.4 % — ABNORMAL HIGH (ref 0.0–0.2)

## 2023-09-16 LAB — ECHOCARDIOGRAM COMPLETE
Area-P 1/2: 3.65 cm2
Calc EF: 54.2 %
Height: 70 in
S' Lateral: 2.7 cm
Single Plane A2C EF: 55.5 %
Single Plane A4C EF: 54.1 %
Weight: 3428.59 [oz_av]

## 2023-09-16 LAB — RETICULOCYTES
Immature Retic Fract: 35.4 % — ABNORMAL HIGH (ref 2.3–15.9)
RBC.: 4.3 MIL/uL (ref 4.22–5.81)
Retic Count, Absolute: 110.5 10*3/uL (ref 19.0–186.0)
Retic Ct Pct: 2.6 % (ref 0.4–3.1)

## 2023-09-16 LAB — FOLATE: Folate: 8.6 ng/mL (ref >5.9)

## 2023-09-16 LAB — BASIC METABOLIC PANEL WITH GFR
Anion gap: 13 (ref 5–15)
BUN: 14 mg/dL (ref 8–23)
CO2: 22 mmol/L (ref 22–32)
Calcium: 8.6 mg/dL — ABNORMAL LOW (ref 8.9–10.3)
Chloride: 103 mmol/L (ref 98–111)
Creatinine, Ser: 0.89 mg/dL (ref 0.61–1.24)
GFR, Estimated: >60 mL/min (ref >60)
Glucose, Bld: 104 mg/dL — ABNORMAL HIGH (ref 70–99)
Potassium: 3.8 mmol/L (ref 3.5–5.1)
Sodium: 138 mmol/L (ref 135–145)

## 2023-09-16 LAB — IRON AND TIBC
Iron: 12 ug/dL — ABNORMAL LOW (ref 45–182)
Saturation Ratios: 3 % — ABNORMAL LOW (ref 17.9–39.5)
TIBC: 428 ug/dL (ref 250–450)
UIBC: 416 ug/dL

## 2023-09-16 LAB — PREPARE RBC (CROSSMATCH)

## 2023-09-16 LAB — HEPARIN LEVEL (UNFRACTIONATED)
Heparin Unfractionated: <0.1 [IU]/mL — ABNORMAL LOW (ref 0.30–0.70)
Heparin Unfractionated: 0.2 [IU]/mL — ABNORMAL LOW (ref 0.30–0.70)
Heparin Unfractionated: 0.22 [IU]/mL — ABNORMAL LOW (ref 0.30–0.70)

## 2023-09-16 LAB — VITAMIN B12: Vitamin B-12: 117 pg/mL — ABNORMAL LOW (ref 180–914)

## 2023-09-16 LAB — FIBRINOGEN: Fibrinogen: 739 mg/dL — ABNORMAL HIGH (ref 210–475)

## 2023-09-16 LAB — FERRITIN: Ferritin: 31 ng/mL (ref 24–336)

## 2023-09-16 LAB — HIV ANTIBODY (ROUTINE TESTING W REFLEX): HIV Screen 4th Generation wRfx: NONREACTIVE

## 2023-09-16 LAB — APTT: aPTT: 31 s (ref 24–36)

## 2023-09-16 MED ORDER — PERFLUTREN LIPID MICROSPHERE
1.0000 mL | INTRAVENOUS | Status: AC | PRN
  Administered 2023-09-16: 2 mL via INTRAVENOUS

## 2023-09-16 MED ORDER — SODIUM CHLORIDE 0.9 % IV SOLN: 250.0000 mL | INTRAVENOUS | Status: AC | PRN

## 2023-09-16 MED ORDER — AMLODIPINE BESYLATE 5 MG PO TABS
5.0000 mg | ORAL_TABLET | Freq: Every morning | ORAL | Status: DC
  Administered 2023-09-17: 5 mg via ORAL
  Filled 2023-09-16: qty 1

## 2023-09-16 MED ORDER — HEPARIN BOLUS VIA INFUSION
2000.0000 [IU] | Freq: Once | INTRAVENOUS | Status: AC
  Administered 2023-09-16: 2000 [IU] via INTRAVENOUS
  Filled 2023-09-16: qty 2000

## 2023-09-16 MED ORDER — SODIUM CHLORIDE 0.9% IV SOLUTION: Freq: Once | INTRAVENOUS | Status: AC

## 2023-09-16 MED ORDER — FERROUS SULFATE 325 (65 FE) MG PO TABS
325.0000 mg | ORAL_TABLET | Freq: Every day | ORAL | Status: DC
  Administered 2023-09-17 – 2023-09-19 (×3): 325 mg via ORAL
  Filled 2023-09-16 (×3): qty 1

## 2023-09-16 MED ORDER — CYANOCOBALAMIN 1000 MCG/ML IJ SOLN
1000.0000 ug | Freq: Every day | INTRAMUSCULAR | Status: AC
Start: 2023-09-16 — End: 2023-09-19
  Administered 2023-09-16 – 2023-09-18 (×3): 1000 ug via INTRAMUSCULAR
  Filled 2023-09-16 (×3): qty 1

## 2023-09-16 MED ORDER — MIDAZOLAM HCL 2 MG/2ML IJ SOLN
INTRAMUSCULAR | Status: AC
Start: 2023-09-16 — End: ?
  Filled 2023-09-16: qty 2

## 2023-09-16 MED ORDER — SODIUM CHLORIDE 0.9% FLUSH: 3.0000 mL | INTRAVENOUS | Status: DC | PRN

## 2023-09-16 MED ORDER — SODIUM CHLORIDE 0.9 % IV SOLN
12.0000 mg | Freq: Once | INTRAVENOUS | Status: AC
  Administered 2023-09-16: 12 mg via INTRAVENOUS
  Filled 2023-09-16: qty 12

## 2023-09-16 MED ORDER — SODIUM CHLORIDE 0.9% FLUSH
3.0000 mL | Freq: Two times a day (BID) | INTRAVENOUS | Status: DC
  Administered 2023-09-17 – 2023-09-19 (×5): 3 mL via INTRAVENOUS

## 2023-09-16 MED ORDER — HYDRALAZINE HCL 20 MG/ML IJ SOLN
10.0000 mg | Freq: Four times a day (QID) | INTRAMUSCULAR | Status: DC | PRN
  Administered 2023-09-16: 10 mg via INTRAVENOUS
  Filled 2023-09-16: qty 1

## 2023-09-16 MED ORDER — SODIUM CHLORIDE (PF) 0.9 % IJ SOLN
INTRAMUSCULAR | Status: AC
  Filled 2023-09-16: qty 50

## 2023-09-16 MED ORDER — MIDAZOLAM HCL 2 MG/2ML IJ SOLN
INTRAMUSCULAR | Status: AC | PRN
  Administered 2023-09-16: 1 mg via INTRAVENOUS
  Administered 2023-09-16: .5 mg via INTRAVENOUS

## 2023-09-16 MED ORDER — VITAMIN B-12 1000 MCG PO TABS
1000.0000 ug | ORAL_TABLET | Freq: Every day | ORAL | Status: DC
  Administered 2023-09-19: 1000 ug via ORAL
  Filled 2023-09-16: qty 1

## 2023-09-16 MED ORDER — SODIUM CHLORIDE 0.9 % IV SOLN
100.0000 mg | Freq: Once | INTRAVENOUS | Status: AC
  Administered 2023-09-16: 100 mg via INTRAVENOUS
  Filled 2023-09-16: qty 5

## 2023-09-16 MED ORDER — OXYBUTYNIN CHLORIDE 5 MG PO TABS
5.0000 mg | ORAL_TABLET | Freq: Three times a day (TID) | ORAL | Status: DC | PRN
  Administered 2023-09-16 – 2023-09-18 (×2): 5 mg via ORAL
  Filled 2023-09-16 (×2): qty 1

## 2023-09-16 MED ORDER — IOHEXOL 300 MG/ML  SOLN
100.0000 mL | Freq: Once | INTRAMUSCULAR | Status: AC | PRN
Start: 2023-09-16 — End: 2023-09-16
  Administered 2023-09-16: 100 mL via INTRAVENOUS

## 2023-09-16 MED ORDER — LIDOCAINE HCL 1 % IJ SOLN
INTRAMUSCULAR | Status: AC
  Filled 2023-09-16: qty 20

## 2023-09-16 MED ORDER — PANTOPRAZOLE SODIUM 40 MG IV SOLR
40.0000 mg | Freq: Two times a day (BID) | INTRAVENOUS | Status: DC
  Administered 2023-09-17 (×3): 40 mg via INTRAVENOUS
  Filled 2023-09-16 (×3): qty 10

## 2023-09-16 MED ORDER — IOHEXOL 300 MG/ML  SOLN
50.0000 mL | Freq: Once | INTRAMUSCULAR | Status: AC | PRN
  Administered 2023-09-16: 15 mL via INTRA_ARTERIAL

## 2023-09-16 MED ORDER — PRAVASTATIN SODIUM 40 MG PO TABS
40.0000 mg | ORAL_TABLET | Freq: Every day | ORAL | Status: DC
  Administered 2023-09-17 – 2023-09-18 (×2): 40 mg via ORAL
  Filled 2023-09-16: qty 2
  Filled 2023-09-16: qty 1

## 2023-09-16 MED ORDER — SODIUM CHLORIDE 0.9 % IV SOLN: INTRAVENOUS | Status: DC

## 2023-09-16 MED ORDER — FENTANYL CITRATE (PF) 100 MCG/2ML IJ SOLN
INTRAMUSCULAR | Status: AC
  Filled 2023-09-16: qty 2

## 2023-09-16 MED ORDER — MELATONIN 3 MG PO TABS
3.0000 mg | ORAL_TABLET | Freq: Every evening | ORAL | Status: DC | PRN
  Administered 2023-09-17 – 2023-09-18 (×2): 3 mg via ORAL
  Filled 2023-09-16 (×2): qty 1

## 2023-09-16 MED ORDER — FENTANYL CITRATE (PF) 100 MCG/2ML IJ SOLN
INTRAMUSCULAR | Status: AC | PRN
  Administered 2023-09-16 (×2): 25 ug via INTRAVENOUS

## 2023-09-16 NOTE — Assessment & Plan Note (Signed)
.   Continuing home regimen of lipid lowering therapy.  

## 2023-09-16 NOTE — Hospital Course (Addendum)
 62yo with h/o HTN, HLD, and GERD who presented on 4/4 with DOE and cough.  CTA with extensive bilateral pulmonary emboli with suspicion for right lower lobe infarction.  Patient was hypoxic with an elevated troponin.  She initiated on a heparin infusion.  PCCM reviewed the case with interventional radiology and  intervention was pursued.  Echocardiogram revealed severe depression of the right ventricular function.  Catheter thrombolysis performed.  Also with substantial iron deficiency anemia as well as vitamin B12 deficiency, initiated on supplementation of both iron and vitamin B12.  Also with intermittent melena for at least the past year, last EGD was performed 2021; patient will likely need upper EGD/colonoscopy performed in the outpatient setting after discharge.

## 2023-09-16 NOTE — Assessment & Plan Note (Signed)
 Secondary to right heart strain from submassive pulmonary embolism Unlikely to be secondary to plaque rupture

## 2023-09-16 NOTE — Assessment & Plan Note (Signed)
 Patient reports intermittent melena over at least the past year  Last EGD in 2021 for food impaction/stricture last colonoscopy in 2000 revealing multiple adenomatous polyps with patient due for repeat colonoscopy in 2023 that was not performed Patient was seen in gastroenterology clinic on 4/4 for this melena and iron deficiency anemia but upon being evaluated due to hypoxia and low hemoglobin patient was sent to [facility] emergency department for urgent evaluation. Suspect patient may be suffering from intermittent upper gastrointestinal bleeding based on patient's report of black stools  Transfusing 1 unit packed red blood cells in preparation for thrombolysis Patient is unfortunately too ill during this hospitalization to pursue endoscopic workup with gastroenterology and therefore we will recommend patient see close follow-up with them at time of discharge.   rectal exam done by ER doc was guaic negative per report.

## 2023-09-16 NOTE — Assessment & Plan Note (Signed)
 Secondary to submassive pulmonary embolism Supplemental oxygen wean to maintain oxygen saturations of 92 to 96%. Remainder of assessment and plan as above

## 2023-09-16 NOTE — TOC Initial Note (Signed)
 Transition of Care [facility]) - Initial/Assessment Note    Patient Details  Name: Jeffrey Potter MRN: [id number] Date of Birth: 09-28-1960  Transition of Care [facility]) CM/SW Contact:    Adrian Prows, RN Phone Number: 09/16/2023, 4:28 PM  Clinical Narrative:                 No insurance listed; pt says he lives at home; he identified POC sister Aloysious Vangieson ([phone]); his sister says he will d/c to their mother's home; family will provide transportation; pt verified PCP; he confirmed he does not have insurance; he denied SDOH risks; pt says he does not have DME, HH services, or home oxygen; pt declined resources for insurance; he says he is self-pay; TOC will follow.  Expected Discharge Plan: Home/Self Care Barriers to Discharge: Continued Medical Work up   Patient Goals and CMS Choice Patient states their goals for this hospitalization and ongoing recovery are:: home          Expected Discharge Plan and Services   Discharge Planning Services: CM Consult   Living arrangements for the past 2 months: Single Family Home                                      Prior Living Arrangements/Services Living arrangements for the past 2 months: Single Family Home Lives with:: Self Patient language and need for interpreter reviewed:: Yes Do you feel safe going back to the place where you live?: Yes      Need for Family Participation in Patient Care: Yes (Comment) Care giver support system in place?: Yes (comment) Current home services:  (n/a) Criminal Activity/Legal Involvement Pertinent to Current Situation/Hospitalization: No - Comment as needed  Activities of Daily Living   ADL Screening (condition at time of admission) Independently performs ADLs?: Yes (appropriate for developmental age) Is the patient deaf or have difficulty hearing?: No Does the patient have difficulty seeing, even when wearing glasses/contacts?: No Does the patient have difficulty concentrating,  remembering, or making decisions?: No  Permission Sought/Granted Permission sought to share information with : Case Manager Permission granted to share information with : Yes, Verbal Permission Granted  Share Information with NAME: Case Manager     Permission granted to share info w Relationship: Guss Farruggia (sister) [phone]     Emotional Assessment Appearance:: Appears stated age Attitude/Demeanor/Rapport: Gracious Affect (typically observed): Accepting Orientation: : Oriented to Self, Oriented to Place, Oriented to  Time, Oriented to Situation Alcohol / Substance Use: Not Applicable Psych Involvement: No (comment)  Admission diagnosis:  Pulmonary embolism (HCC) [I26.99] Dyspnea, unspecified type [R06.00] Pulmonary embolism, other, unspecified chronicity, unspecified whether acute cor pulmonale present (HCC) [I26.99] Patient Active Problem List   Diagnosis Date Noted   Iron deficiency anemia 09/16/2023   Vitamin B12 deficiency 09/16/2023   Acute respiratory failure with hypoxia (HCC) 09/16/2023   Thrombocytosis 09/16/2023   Acute pulmonary embolism (HCC) 09/15/2023   Microcytic anemia 09/15/2023   Pulmonary embolism (HCC) 09/15/2023   Syncope 05/05/2022   Hypertension 05/05/2022   Hyperlipidemia 05/05/2022   Renal insufficiency 05/05/2022   Gastric polyps    Gastritis without bleeding    Nodularity of gastric antrum    Esophageal dysphagia    Food impaction of esophagus    Acute esophageal obstruction    Schatzki's ring    PCP:  Garlan Fillers, MD Pharmacy:   [facility] [address], [place] -  [address] [place] [address] Phone: [phone] Fax: [phone]     Social Drivers of Health (SDOH) Social History: SDOH Screenings   Food Insecurity: No Food Insecurity (09/16/2023)  Housing: Low Risk  (09/16/2023)  Transportation Needs: No Transportation Needs (09/16/2023)  Utilities: Not At Risk (09/16/2023)   Tobacco Use: Low Risk  (09/15/2023)   SDOH Interventions: Food Insecurity Interventions: Intervention Not Indicated, Inpatient TOC Housing Interventions: Intervention Not Indicated, Inpatient TOC Transportation Interventions: Intervention Not Indicated, Inpatient TOC Utilities Interventions: Intervention Not Indicated, Inpatient TOC   Readmission Risk Interventions     No data to display

## 2023-09-16 NOTE — Progress Notes (Signed)
 Echocardiogram does reveal severe right ventricular strain with pulmonary hypertension  Severely reduced right ventricular systolic function  Will benefit from catheter directed lytics Risk with bleeding discussed  Benefit outweighs risks at present

## 2023-09-16 NOTE — Assessment & Plan Note (Signed)
 Currently on intravenous PPI in case of bleed with thrombolysis.

## 2023-09-16 NOTE — Assessment & Plan Note (Addendum)
 Identified on CT of abdomen and pelvis Reviewed with Dr. Loreta Ave Already on heparin which he feels is the appropriate therapy at this time.

## 2023-09-16 NOTE — Progress Notes (Signed)
 PHARMACY - ANTICOAGULATION CONSULT NOTE  Pharmacy Consult for Heparin Indication: pulmonary embolus  No Known Allergies  Patient Measurements: Height: 5\' 10"  (177.8 cm) Weight: 97.2 kg (214 lb 4.6 oz) IBW/kg (Calculated) : 73 HEPARIN DW (KG): 93  Vital Signs: Temp: 98.2 F (36.8 C) (04/05 0854) Temp Source: Oral (04/05 0854) BP: 164/92 (04/05 0800) Pulse Rate: 88 (04/05 0800)  Labs: Recent Labs    09/15/23 1607 09/15/23 1753 09/15/23 2251 09/16/23 0542 09/16/23 0553 09/16/23 0912  HGB 7.7*  --   --   --  7.4*  --   HCT 30.2*  --   --   --  28.6*  --   PLT 692*  --   --   --  606*  --   APTT  --   --   --   --  31  --   LABPROT 15.1  --   --   --  15.0  --   INR 1.2  --   --   --  1.2  --   HEPARINUNFRC  --   --  0.37 <0.10*  --  0.20*  CREATININE 0.95  --   --   --  0.89  --   TROPONINIHS 338* 324*  --   --   --   --     Estimated Creatinine Clearance: 98.1 mL/min (by C-G formula based on SCr of 0.89 mg/dL).   Assessment: 55 yoM presents to ED on 4/4 with worsening SOB x2 weeks.  CTa positive for PE. Pharmacy is consulted to dose Heparin IV.  No prior to admission anticoagulation noted Baseline Hgb 7.7, Plt 692k SCr < 1 09/16/2023 First heparin level therapeutic at 0.37 2nd heparin level undetectable at < 0.1 & aPTT 31 seconds, repeat heparin level low at 0.2 on heparin at 1600 units/hr Per RN heparin is infusing well with no interruptions. No bleeding reported Hg 7.4, PLT 606. INR 1.2 IV & PO iron ordered for iron deficiency anemia FOB - 4/4 echo: normal R sided pressures  Goal of Therapy:  Heparin level 0.3-0.7 units/ml Monitor platelets by anticoagulation protocol: Yes   Plan:  Heparin bolus 2000 units IV x 1 Increase heparin rate to 1800 units/hr Check heparin level 6 hours after bolus Daily heparin level and CBC  Herby Abraham, Pharm.D Use secure chat for questions 09/16/2023 9:47 AM

## 2023-09-16 NOTE — Progress Notes (Signed)
 NAME:  Jeffrey Potter, MRN:  [id number], DOB:  September 01, 1960, LOS: 1 ADMISSION DATE:  09/15/2023, CONSULTATION DATE:  09/15/23 REFERRING MD:  Dr Rodena Medin, CHIEF COMPLAINT:  Pulmonary embolism   History of Present Illness:  Patient came in with worsening shortness of breath Has had shortness of breath for about 2 weeks, some cough, chest discomfort on the right side Symptoms did settle down after about 4 to 5 days. Last night 4 /3/25 had chest pain, lightheaded, shortness of breath. Not lightheaded today When seen for a visit with GI for evaluation for anemia, noted to have iron deficiency anemia by primary doctor sometime in December   Evaluation in the ED-noted to have PE with right heart strain   Denies any history of heart disease, no history of lung disease, never smoker No recent surgery No recent travel   Significant history of GI interventions, S rings, food impactions - While in the GI office today was noted to be hypotensive, low oxygen sats of 88%  Pertinent  Medical History   Past Medical History:  Diagnosis Date   GERD (gastroesophageal reflux disease)    High cholesterol    High cholesterol    Hypertension     Significant Hospital Events: Including procedures, antibiotic start and stop dates in addition to other pertinent events   09/15/2023-CT chest-pulmonary embolism right heart strain, interventricular septum flattening.  Pulmonary infarct 09/15/2023-echocardiogram-result pending  Interim History / Subjective:  No overnight events On oxygen supplementation Denies any pain or discomfort  Objective   Blood pressure (!) 163/95, pulse 86, temperature 98.1 F (36.7 C), temperature source Oral, resp. rate (!) 26, height 5\' 10"  (1.778 m), weight 97.2 kg, SpO2 95%.        Intake/Output Summary (Last 24 hours) at 09/16/2023 4098 Last data filed at 09/16/2023 1191 Gross per 24 hour  Intake 330.44 ml  Output 630 ml  Net -299.56 ml   Filed Weights   09/15/23 1555 09/15/23  2200  Weight: 98.9 kg 97.2 kg    Examination: General: Elderly, does not appear to be in distress HENT: Moist oral mucosa Lungs: Decreased air movement bilaterally Cardiovascular: S1-S2 appreciated Abdomen: Soft, bowel sounds appreciated Extremities: No clubbing, no edema Neuro: Awake alert oriented GU: Thad Ranger output  [facility] Problem list     Assessment & Plan:  Submassive pulmonary embolism Pulmonary infarct -Currently on anticoagulation  Severe anemia - Continue to monitor - No evidence of bleeding - Background diagnosis of iron deficiency anemia  On antibiotic coverage for possible pneumonia  Pulmonary infarct - Pain management as needed  Will follow-up on echocardiogram report  There is risk of hemoptysis with his pulmonary infarct Hemoglobin did trend down with current anticoagulation  If echocardiogram does not reveal significant right heart strain, safest approach may be to continue current anticoagulation schedule  Best Practice (right click and "Reselect all SmartList Selections" daily)   Diet/type: Regular consistency (see orders) DVT prophylaxis systemic heparin Pressure ulcer(s): N/A GI prophylaxis: PPI Lines: N/A Foley:  N/A Code Status:  full code Last date of multidisciplinary goals of care discussion [09/16/2023]  Labs   CBC: Recent Labs  Lab 09/15/23 1607 09/16/23 0553  WBC 13.9* 11.7*  NEUTROABS 12.3*  --   HGB 7.7* 7.4*  HCT 30.2* 28.6*  MCV 65.7* 66.1*  PLT 692* 606*    Basic Metabolic Panel: Recent Labs  Lab 09/15/23 1607 09/16/23 0553  NA 137 138  K 4.1 3.8  CL 102 103  CO2 23 22  GLUCOSE 137* 104*  BUN 16 14  CREATININE 0.95 0.89  CALCIUM 8.9 8.6*   GFR: Estimated Creatinine Clearance: 98.1 mL/min (by C-G formula based on SCr of 0.89 mg/dL). Recent Labs  Lab 09/15/23 1607 09/15/23 1628 09/15/23 1804 09/16/23 0553  WBC 13.9*  --   --  11.7*  LATICACIDVEN  --  2.0* 1.8  --     Liver Function  Tests: Recent Labs  Lab 09/15/23 1607  AST 12*  ALT 16  ALKPHOS 81  BILITOT 0.7  PROT 8.5*  ALBUMIN 2.8*   No results for input(s): "LIPASE", "AMYLASE" in the last 168 hours. No results for input(s): "AMMONIA" in the last 168 hours.  ABG No results found for: "PHART", "PCO2ART", "PO2ART", "HCO3", "TCO2", "ACIDBASEDEF", "O2SAT"   Coagulation Profile: Recent Labs  Lab 09/15/23 1607  INR 1.2    Cardiac Enzymes: No results for input(s): "CKTOTAL", "CKMB", "CKMBINDEX", "TROPONINI" in the last 168 hours.  HbA1C: No results found for: "HGBA1C"  CBG: No results for input(s): "GLUCAP" in the last 168 hours.  Review of Systems:   Denies any pain or discomfort at present  Past Medical History:  He,  has a past medical history of GERD (gastroesophageal reflux disease), High cholesterol, High cholesterol, and Hypertension.   Surgical History:   Past Surgical History:  Procedure Laterality Date   BALLOON DILATION N/A 10/10/2017   Procedure: BALLOON DILATION;  Surgeon: Beverley Fiedler, MD;  Location: [facility];  Service: Gastroenterology;  Laterality: N/A;   ESOPHAGOGASTRODUODENOSCOPY N/A 08/18/2017   Procedure: ESOPHAGOGASTRODUODENOSCOPY (EGD);  Surgeon: Beverley Fiedler, MD;  Location: [facility];  Service: Gastroenterology;  Laterality: N/A;   ESOPHAGOGASTRODUODENOSCOPY N/A 09/17/2017   Procedure: ESOPHAGOGASTRODUODENOSCOPY (EGD);  Surgeon: Jeani Hawking, MD;  Location: [facility];  Service: Endoscopy;  Laterality: N/A;   ESOPHAGOGASTRODUODENOSCOPY N/A 10/08/2018   Procedure: ESOPHAGOGASTRODUODENOSCOPY (EGD);  Surgeon: Benancio Deeds, MD;  Location: [facility];  Service: Gastroenterology;  Laterality: N/A;   ESOPHAGOGASTRODUODENOSCOPY N/A 09/08/2019   Procedure: ESOPHAGOGASTRODUODENOSCOPY (EGD);  Surgeon: Hilarie Fredrickson, MD;  Location: [facility];  Service: Endoscopy;  Laterality: N/A;   ESOPHAGOGASTRODUODENOSCOPY (EGD) WITH PROPOFOL N/A 10/10/2017   Procedure:  ESOPHAGOGASTRODUODENOSCOPY (EGD) WITH PROPOFOL;  Surgeon: Beverley Fiedler, MD;  Location: [facility];  Service: Gastroenterology;  Laterality: N/A;  with biopsy of gastric polyps   FOREIGN BODY REMOVAL  10/08/2018   Procedure: FOREIGN BODY REMOVAL;  Surgeon: Benancio Deeds, MD;  Location: [facility];  Service: Gastroenterology;;  Food Impaction   FOREIGN BODY REMOVAL  09/08/2019   Procedure: FOREIGN BODY REMOVAL;  Surgeon: Hilarie Fredrickson, MD;  Location: [facility];  Service: Endoscopy;;     Social History:   reports that he has never smoked. He has never used smokeless tobacco. He reports current alcohol use. He reports that he does not use drugs.   Family History:  His family history includes Cancer in his father.   Allergies No Known Allergies   Virl Diamond, MD [facility] Pager: See Loretha Stapler

## 2023-09-16 NOTE — Assessment & Plan Note (Signed)
 Workup revealing extremely low vitamin B12 117  vitamin B12 injections x 3 days followed by oral supplementation

## 2023-09-16 NOTE — Progress Notes (Addendum)
 PHARMACY - ANTICOAGULATION CONSULT NOTE  Pharmacy Consult for Heparin Indication: pulmonary embolus  No Known Allergies  Patient Measurements: Height: 5\' 10"  (177.8 cm) Weight: 97.2 kg (214 lb 4.6 oz) IBW/kg (Calculated) : 73 HEPARIN DW (KG): 93  Vital Signs: Temp: 97.8 F (36.6 C) (04/05 1838) Temp Source: Oral (04/05 1838) BP: 162/90 (04/05 2100) Pulse Rate: 91 (04/05 2100)  Labs: Recent Labs    09/15/23 1607 09/15/23 1753 09/15/23 2251 09/16/23 0542 09/16/23 0553 09/16/23 0912 09/16/23 2132  HGB 7.7*  --   --   --  7.4*  --  8.8*  HCT 30.2*  --   --   --  28.6*  --  32.6*  PLT 692*  --   --   --  606*  --  521*  APTT  --   --   --   --  31  --   --   LABPROT 15.1  --   --   --  15.0  --   --   INR 1.2  --   --   --  1.2  --   --   HEPARINUNFRC  --   --    < > <0.10*  --  0.20* 0.22*  CREATININE 0.95  --   --   --  0.89  --   --   TROPONINIHS 338* 324*  --   --   --   --   --    < > = values in this interval not displayed.    Estimated Creatinine Clearance: 98.1 mL/min (by C-G formula based on SCr of 0.89 mg/dL).   Assessment: 74 yoM presents to ED on 4/4 with worsening SOB x2 weeks.  CTa positive for PE. Pharmacy is consulted to dose Heparin IV.  Catheter directed lysis started 4/5  HL 0.22 subtherapeutic on 1800 units/hr Hgb 8.8, plts 521, fibrinogen 739 No bleeding noted per RN  Goal of Therapy:  Heparin 0.3-0.5 units/ml Monitor platelets by anticoagulation protocol: Yes   Plan:  Increase heparin rate to 2000 units/hr Q6h heparin level, CBC and fibrinogen x4 Daily heparin level and CBC  Arley Phenix RPh 09/16/2023, 10:23 PM  -0305 HL 0.25 subtherapeutic on 2000 units/hr, per d/w with RN heparin rate adjusted only 2 hours prior to this level -Hgb remains low but stable, plts elevated -Fibrinogen 718  Plan: Continue heparin at 2000 units/hr Q6h heparin level, CBC and fibrinogen  Arley Phenix RPh 09/17/2023, 3:42 AM

## 2023-09-16 NOTE — Progress Notes (Signed)
 Patient admitted with pulmonary embolism with right heart strain and elevated troponin level. IR consulted for possible thrombectomy. Patient assessed at the bedside this morning and he is currently stable on a heparin infusion. He states he feels a little better. Echocardiogram has been performed with the results pending. I briefly discussed what a possible intervention would entail and that we are waiting on the results of the echocardiogram before determining next steps. Dr. Wynona Neat updated.   IR will continue to follow.   Alwyn Ren, AGACNP-BC 09/16/2023, 10:23 AM

## 2023-09-16 NOTE — Assessment & Plan Note (Signed)
 Likely secondary to severe ongoing iron deficiency anemia

## 2023-09-16 NOTE — Progress Notes (Addendum)
 PROGRESS NOTE   Jeffrey Potter  ZOX:[id number] DOB: Aug 07, 1960 DOA: 09/15/2023 PCP: Garlan Fillers, MD   Date of Service: the patient was seen and examined on 09/16/2023  Brief Narrative:  63 y.o. male with medical history significant for hypertension, hyperlipidemia, gastroesophageal reflux disease presented to [facility] emergency room with a 7-day history of increasing dyspnea on exertion and cough.  Upon evaluation in the emergency department patient was found to be hypoxic requiring initiation of supplemental oxygen via nasal cannula.  CT angiogram of the chest revealed extensive bilateral pulmonary emboli with suspicion for right lower lobe infarction.  Patient additionally was found to have an elevated troponin.  Case was discussed with PCCM and patient was admitted to the hospital service.  Upon admission to the hospital service patient was initiated on a heparin infusion.  PCCM had the opportunity to review the case with interventional radiology and considering the concern for a right lower lobe infarction no thrombolytic thrombus to me intervention was pursued.  Echocardiogram was ordered.   Of note, the patient was also found to have substantial iron deficiency anemia as well as vitamin B12 deficiency.  Patient was initiated on supplementation however the presence of substantial anemia in the setting of unprovoked bilateral pulmonary embolism brought up the concern for possible occult malignancy.  CT imaging throughout the abdomen and pelvis was obtained in addition to a PSA.  Patient was advised that he needs to have prompt evaluation via colonoscopy after discharge as well.  Stool Hemoccult was found to be negative during this hospitalization.   Assessment & Plan Acute pulmonary embolism with acute cor pulmonale (HCC) Extensive bilateral pulmonary emboli concerning for submassive pulmonary embolism Elevated troponins of 338 and 324 with echocardiogram today revealing acute  severe reduction in right ventricular systolic function Patient was initiated on a heparin upon admission and will likely need lifelong anticoagulation after discharge Case discussed extensively with Dr. Wynona Neat with PCCM and Dr. Loreta Ave with IR , patient to undergo thrombolysis.  Catheter will be placed this afternoon with infusion over the following 12 hours. Providing patient with 1 unit packed red blood cell transfusion in case of bleeding post procedure. Supplemental oxygen for associated acute hypoxic respiratory failure Monitoring closely in stepdown as patient is at high risk of rapid clinical decompensation. Concerning etiology, I am concerned for possible undiagnosed malignancy, possibly GI malignancy considering numerous adenomatous polyps identified in 2000 with patient failing to follow-up for the recommended repeat surveillance colonoscopy in 2023.  SPEP and UPEP and PSA pending.  CT imaging of abdomen pelvis reveals no other obvious malignancy.  Acute respiratory failure with hypoxia (HCC) Secondary to submassive pulmonary embolism Supplemental oxygen to maintain oxygen saturations of 92 to 96%. Remainder of assessment and plan as above Melena Patient reports intermittent melena over at least the past year  Last EGD in 2021 for food impaction/stricture last colonoscopy in 2000 revealing multiple adenomatous polyps with patient due for repeat colonoscopy in 2023 that was not performed Patient was seen in gastroenterology clinic on 4/4 for this melena and iron deficiency anemia but upon being evaluated due to hypoxia and low hemoglobin patient was sent to [facility] emergency department for urgent evaluation. Suspect patient may be suffering from intermittent upper gastrointestinal bleeding based on patient's report of black stools  Transfusing 1 unit packed red blood cells in preparation for thrombolysis Patient is unfortunately too ill during this hospitalization to pursue  endoscopic workup with gastroenterology and therefore we will recommend  patient see close follow-up with them at time of discharge.   rectal exam done by ER doc was guaic negative per report.  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 Loreta Ave Already on heparin which he feels is the appropriate therapy at this time. Iron deficiency anemia Workup including iron panel suggestive of severe iron deficiency anemia  Patient placed on intravenous Venofer infusion followed by daily oral ferrous sulfate.   Vitamin B12 deficiency Workup revealing extremely low vitamin B12 117  vitamin B12 injections x 3 days followed by oral supplementation  Thrombocytosis Likely secondary to severe ongoing iron deficiency anemia Essential hypertension Resume home regimen of amlodipine As needed intravenous hydralazine for markedly elevated blood pressure Mixed hyperlipidemia Continuing home regimen of lipid lowering therapy.  GERD without esophagitis Currently on intravenous PPI in case of bleed with thrombolysis.     Subjective:  Patient reports of mild shortness of breath.  Patient currently denies chest pain.  Patient reports longstanding history of intermittent melena although he states he has not had any episodes in the past several weeks.  Physical Exam:  Vitals:   09/16/23 1800 09/16/23 1805 09/16/23 1810 09/16/23 1838  BP: (!) 153/90     Pulse: 98 96 96   Resp: (!) 28 (!) 30 (!) 36   Temp:    97.8 F (36.6 C)  TempSrc:    Oral  SpO2: 96% 94% 91%   Weight:      Height:        Constitutional: Awake alert and oriented x3, no associated distress.   Skin: Increased skin pallor noted.  No rashes, no lesions, poor skin turgor noted. Eyes: Pupils are equally reactive to light.  Increased  conjunctival pallor without scleral icterus. ENMT: Moist mucous membranes noted.  Posterior pharynx clear of any exudate or lesions.   Respiratory: clear to auscultation bilaterally, no wheezing, no crackles. Normal respiratory effort. No accessory muscle use.  Cardiovascular: Regular rate and rhythm, no murmurs / rubs / gallops. No extremity edema. 2+ pedal pulses. No carotid bruits.  Abdomen: Abdomen is soft and nontender.  No evidence of intra-abdominal masses.  Positive bowel sounds noted in all quadrants.   Musculoskeletal: No joint deformity upper and lower extremities. Good ROM, no contractures. Normal muscle tone.    Data Reviewed:  I have personally reviewed and interpreted labs, imaging.  Significant findings are   CBC: Recent Labs  Lab 09/15/23 1607 09/16/23 0553  WBC 13.9* 11.7*  NEUTROABS 12.3*  --   HGB 7.7* 7.4*  HCT 30.2* 28.6*  MCV 65.7* 66.1*  PLT 692* 606*   Basic Metabolic Panel: Recent Labs  Lab 09/15/23 1607 09/16/23 0553  NA 137 138  K 4.1 3.8  CL 102 103  CO2 23 22  GLUCOSE 137* 104*  BUN 16 14  CREATININE 0.95 0.89  CALCIUM 8.9 8.6*   GFR: Estimated Creatinine Clearance: 98.1 mL/min (by C-G formula based on SCr of 0.89 mg/dL). Liver Function Tests: Recent Labs  Lab 09/15/23 1607  AST 12*  ALT 16  ALKPHOS 81  BILITOT 0.7  PROT 8.5*  ALBUMIN 2.8*    Coagulation Profile: Recent Labs  Lab 09/15/23 1607 09/16/23 0553  INR 1.2 1.2     Telemetry: Personally reviewed.  Rhythm is normal sinus rhythm with heart rate of 90 bpm.  No dynamic  ST segment changes appreciated.   Code Status:  Full code.  Code status decision has been confirmed with: patient Family Communication: Sister is at the bedside and has been updated on plan of care.   CRITICAL CARE ATTESTATION:  Patient is a significant risk of morbidity and mortality secondary to bilateral submassive pulmonary embolism requiring supplemental oxygen, frequent clinical  assessments, coordination with multiple specialists including PCCM and interventional radiology, obtaining of multiple radiographic studies including CT imaging of the chest abdomen and pelvis, transfusion of blood products, notifying and educating family and frequent blood testing and interpretation.  Critical care time spent 72 minutes.  Time spent:  72 minutes  Author:  Marinda Elk MD  09/16/2023 7:10 PM

## 2023-09-16 NOTE — Assessment & Plan Note (Signed)
 Workup including iron panel suggestive of severe iron deficiency anemia  Patient placed on intravenous Venofer infusion followed by daily oral ferrous sulfate.

## 2023-09-16 NOTE — Procedures (Signed)
 Interventional Radiology Procedure Note  Procedure:   Placement of bilateral pulmonary artery lytic catheters via right IJ approach.   Initiation of thrombolysis.    Findings: Central PA pressure 66/31 (44)  Complications: None  Recommendations:  - continue icu care - bedrest overnight - foley until lysis complete.  May be removed 6am unless otherwise indicated - Q6hr fibrinogen - Q6hr H&H - Q 6hr heparin level, targeting low dose protocol per pharmacy, thank you - if severe new headache, MS changes, or new bleeding, stop tPA and page on call VIR - if fibinogen level below 150 page VIR on call - Ok for elevate hob - clear liquids ok, full diet ok after catheters removed tomorrow   Pager [phone]  Signed,  Yvone Neu. Loreta Ave, DO, ABVM, RPVI

## 2023-09-16 NOTE — Assessment & Plan Note (Signed)
 Resume home regimen of amlodipine As needed intravenous hydralazine for markedly elevated blood pressure

## 2023-09-16 NOTE — Consult Note (Signed)
 Chief Complaint: Patient was seen in consultation today for  Chief Complaint  Patient presents with   Shortness of Breath   Referring Physician(s): Dr. Wynona Neat  Supervising Physician: Gilmer Mor  Patient Status: [facility] - In-pt  History of Present Illness: Jeffrey Potter is a 63 y.o. male who presented to the [facility] ED 09/15/23 for evaluation of shortness of breath. He was sent to the ED after an appointment with his GI doctor (routine appointment) and was noted to be hypotensive and with an oxygen saturation level of 88% on room air. He reported increased dyspnea with exertion and a mild cough. Work up in the ED was significant for a submassive PE with right heart strain and elevated troponin level. He was hemodynamically stable but required oxygen supplementation.   IR was consulted for possible procedure and patient was assessed this morning. He was found to be in stable condition and stated he felt better. The team was hopeful conservative measures would be sufficient however his echocardiogram showed a severely dilated right ventricle with severe systolic dysfunction suggestive of acute right heart strain.   After further discussions with Dr. Wynona Neat and Dr. Leafy Half the consensus was to proceed with thrombolysis. Imaging reviewed and procedure approved by Dr. Loreta Ave.     Past Medical History:  Diagnosis Date   GERD (gastroesophageal reflux disease)    High cholesterol    High cholesterol    Hypertension     Past Surgical History:  Procedure Laterality Date   BALLOON DILATION N/A 10/10/2017   Procedure: BALLOON DILATION;  Surgeon: Beverley Fiedler, MD;  Location: [facility];  Service: Gastroenterology;  Laterality: N/A;   ESOPHAGOGASTRODUODENOSCOPY N/A 08/18/2017   Procedure: ESOPHAGOGASTRODUODENOSCOPY (EGD);  Surgeon: Beverley Fiedler, MD;  Location: [facility];  Service: Gastroenterology;  Laterality: N/A;   ESOPHAGOGASTRODUODENOSCOPY N/A 09/17/2017   Procedure:  ESOPHAGOGASTRODUODENOSCOPY (EGD);  Surgeon: Jeani Hawking, MD;  Location: [facility];  Service: Endoscopy;  Laterality: N/A;   ESOPHAGOGASTRODUODENOSCOPY N/A 10/08/2018   Procedure: ESOPHAGOGASTRODUODENOSCOPY (EGD);  Surgeon: Benancio Deeds, MD;  Location: [facility];  Service: Gastroenterology;  Laterality: N/A;   ESOPHAGOGASTRODUODENOSCOPY N/A 09/08/2019   Procedure: ESOPHAGOGASTRODUODENOSCOPY (EGD);  Surgeon: Hilarie Fredrickson, MD;  Location: [facility];  Service: Endoscopy;  Laterality: N/A;   ESOPHAGOGASTRODUODENOSCOPY (EGD) WITH PROPOFOL N/A 10/10/2017   Procedure: ESOPHAGOGASTRODUODENOSCOPY (EGD) WITH PROPOFOL;  Surgeon: Beverley Fiedler, MD;  Location: [facility];  Service: Gastroenterology;  Laterality: N/A;  with biopsy of gastric polyps   FOREIGN BODY REMOVAL  10/08/2018   Procedure: FOREIGN BODY REMOVAL;  Surgeon: Benancio Deeds, MD;  Location: [facility];  Service: Gastroenterology;;  Food Impaction   FOREIGN BODY REMOVAL  09/08/2019   Procedure: FOREIGN BODY REMOVAL;  Surgeon: Hilarie Fredrickson, MD;  Location: [facility];  Service: Endoscopy;;    Allergies: Patient has no known allergies.  Medications: Prior to Admission medications   Medication Sig Start Date End Date Taking? Authorizing Provider  amLODipine (NORVASC) 5 MG tablet Take 5 mg by mouth in the morning.   Yes [provider]  iron polysaccharides (NIFEREX) 150 MG capsule Take 150 mg by mouth. Take 1 capsule 3 times a week. Monday Wednesday friday 07/05/23  Yes [provider]  lovastatin (MEVACOR) 20 MG tablet Take 40 mg by mouth in the morning.   Yes [provider]  Omega-3 Fatty Acids (FISH OIL) 1000 MG CAPS Take 1 capsule by mouth daily. 09/03/19  Yes [provider]  omeprazole (PRILOSEC OTC) 20 MG  tablet Take 20 mg by mouth in the morning.   Yes [provider]  Potassium Chloride ER 20 MEQ TBCR Take 1 tablet by mouth daily. 07/06/23  Yes [provider]  amoxicillin (AMOXIL) 500 MG capsule Take 500 mg by mouth 3 (three) times daily. Patient not taking: Reported on 09/16/2023 08/16/23   [provider]     Family History  Problem Relation Age of Onset   Cancer Father     Social History   Socioeconomic History   Marital status: Single    Spouse name: Not on file   Number of children: Not on file   Years of education: Not on file   Highest education level: Not on file  Occupational History   Not on file  Tobacco Use   Smoking status: Never   Smokeless tobacco: Never  Substance and Sexual Activity   Alcohol use: Yes    Comment: occasionally   Drug use: No   Sexual activity: Not on file  Other Topics Concern   Not on file  Social History Narrative   Not on file   Social Drivers of Health   Financial Resource Strain: Not on file  Food Insecurity: No Food Insecurity (09/15/2023)   Hunger Vital Sign    Worried About Running Out of Food in the Last Year: Never true    Ran Out of Food in the Last Year: Never true  Transportation Needs: No Transportation Needs (09/15/2023)   PRAPARE - Administrator, Civil Service (Medical): No    Lack of Transportation (Non-Medical): No  Physical Activity: Not on file  Stress: Not on file  Social Connections: Not on file    Review of Systems: A 12 point ROS discussed and pertinent positives are indicated in the HPI above.  All other systems are negative.  Review of Systems  Constitutional:  Positive for fatigue.  Respiratory:  Positive for shortness of breath.   Gastrointestinal:  Negative for abdominal pain, diarrhea, nausea and vomiting.  Musculoskeletal:  Negative for back pain.  Neurological:  Negative for dizziness and headaches.    Vital Signs: BP (!) 164/92   Pulse 88   Temp 98.2 F (36.8 C) (Oral)   Resp (!) 30   Ht 5\' 10"  (1.778 m)   Wt 214 lb 4.6 oz (97.2 kg)   SpO2 95%   BMI 30.75 kg/m   Physical Exam Constitutional:      General: He is not in  acute distress.    Appearance: He is not ill-appearing.  HENT:     Mouth/Throat:     Mouth: Mucous membranes are moist.     Pharynx: Oropharynx is clear.  Cardiovascular:     Rate and Rhythm: Normal rate.     Pulses: Normal pulses.  Pulmonary:     Effort: Pulmonary effort is normal.  Abdominal:     Tenderness: There is no abdominal tenderness.  Skin:    General: Skin is warm and dry.  Neurological:     Mental Status: He is alert and oriented to person, place, and time.  Psychiatric:        Mood and Affect: Mood normal.        Behavior: Behavior normal.        Thought Content: Thought content normal.        Judgment: Judgment normal.     Imaging: CT ABDOMEN PELVIS W CONTRAST Result Date: 09/16/2023 CLINICAL DATA:  Acute pulmonary embolism. Evaluation for occult malignancy. *  Tracking Code: BO * EXAM: CT ABDOMEN AND PELVIS WITH CONTRAST TECHNIQUE: Multidetector CT imaging of the abdomen and pelvis was performed using the standard protocol following bolus administration of intravenous contrast. RADIATION DOSE REDUCTION: This exam was performed according to the departmental dose-optimization program which includes automated exposure control, adjustment of the mA and/or kV according to patient size and/or use of iterative reconstruction technique. CONTRAST:  OMNIPAQUE IOHEXOL 300 MG/ML  SOLN COMPARISON:  Chest CTA on 09/15/2023 FINDINGS: Lower chest: Pulmonary embolism again seen in the visualized lower lobe pulmonary arteries bilaterally. Small right pleural effusion and right lower lobe atelectasis or infarct show no significant change. Hepatobiliary: No hepatic masses identified. Gallbladder is unremarkable. No evidence of biliary ductal dilatation. Pancreas:  No mass or inflammatory changes. Spleen:  Within normal limits in size and appearance. Adrenals/Urinary Tract: No suspicious masses identified. No evidence of hydronephrosis. Bladder is unremarkable. Stomach/Bowel: Moderate to  large hiatal hernia noted. No masses identified. No evidence of obstruction, inflammatory process or abnormal fluid collections. Vascular/Lymphatic: No pathologically enlarged lymph nodes identified. Nonocclusive DVT seen in the infrahepatic IVC. Reproductive:  No mass or other significant abnormality. Other:  None. Musculoskeletal:  No suspicious bone lesions identified. IMPRESSION: No evidence of abdominal or pelvic malignancy. Nonocclusive DVT in IVC. Moderate to large hiatal hernia. Bilateral lower lobe pulmonary embolism, with small right pleural effusion and right lower lobe atelectasis or infarct, without change compared to recent chest CTA. Electronically Signed   By: Danae Orleans M.D.   On: 09/16/2023 11:36   ECHOCARDIOGRAM COMPLETE Result Date: 09/16/2023    ECHOCARDIOGRAM REPORT   Patient Name:   ASCENCION COYE Date of Exam: 09/16/2023 Medical Rec #:  [id number]     Height:       70.0 in Accession #:    [id number]    Weight:       214.3 lb Date of Birth:  09/17/1960    BSA:          2.149 m Patient Age:    62 years      BP:           163/95 mmHg Patient Gender: M             HR:           88 bpm. Exam Location:  Inpatient Procedure: 2D Echo, Cardiac Doppler, Color Doppler and Intracardiac            Opacification Agent (Both Spectral and Color Flow Doppler were            utilized during procedure). Indications:    I26.02 Pulmonary embolus  History:        Patient has prior history of Echocardiogram examinations, most                 recent 05/06/2022. Signs/Symptoms:Syncope; Risk                 Factors:Hypertension and Dyslipidemia.  Sonographer:    Sheralyn Boatman RDCS Referring Phys: [id number] [facility] GOEL  Sonographer Comments: Technically difficult study due to poor echo windows. Image acquisition challenging due to patient body habitus. IMPRESSIONS  1. Left ventricular ejection fraction, by estimation, is 50 to 55%. Left ventricular ejection fraction by 2D MOD biplane is 54.2 %. The left ventricle has low  normal function. The left ventricle has no regional wall motion abnormalities. There is mild left ventricular hypertrophy. Left ventricular diastolic parameters are consistent with Grade I diastolic dysfunction (impaired relaxation). There is  the interventricular septum is flattened in systole and diastole, consistent with right ventricular pressure and volume overload.  2. Right ventricular systolic function is severely reduced. The right ventricular size is severely enlarged. Tricuspid regurgitation signal is inadequate for assessing PA pressure, however increased right heart pressure is suspected.  3. Right atrial size was moderately dilated.  4. The mitral valve is normal in structure. No evidence of mitral valve regurgitation. No evidence of mitral stenosis.  5. The aortic valve was not well visualized. Aortic valve regurgitation is not visualized. No aortic stenosis is present.  6. The inferior vena cava is dilated in size with <50% respiratory variability, suggesting right atrial pressure of 15 mmHg. Comparison(s): Changes from prior study are noted. 05/06/2022: LVEF 70-75%, hyperdynamic RV function. Echo now consistent with severe RV systolic dysfunction and right heart strain, suggestive of hemodynamically significant pulmonary embolism. Conclusion(s)/Recommendation(s): Critical findings reported to Dr. De Hollingshead and acknowledged at 09/16/2023 at 11:33 am. FINDINGS  Left Ventricle: Left ventricular ejection fraction, by estimation, is 50 to 55%. Left ventricular ejection fraction by 2D MOD biplane is 54.2 %. The left ventricle has low normal function. The left ventricle has no regional wall motion abnormalities. Definity contrast agent was given IV to delineate the left ventricular endocardial borders. There is mild left ventricular hypertrophy. The interventricular septum is flattened in systole and diastole, consistent with right ventricular pressure and volume overload. Left ventricular diastolic parameters  are consistent with Grade I diastolic dysfunction (impaired relaxation). Indeterminate filling pressures. Right Ventricle: The right ventricular size is severely enlarged. No increase in right ventricular wall thickness. Right ventricular systolic function is severely reduced. Tricuspid regurgitation signal is inadequate for assessing PA pressure. Left Atrium: Left atrial size was normal in size. Right Atrium: Right atrial size was moderately dilated. Pericardium: There is no evidence of pericardial effusion. Mitral Valve: The mitral valve is normal in structure. No evidence of mitral valve regurgitation. No evidence of mitral valve stenosis. Tricuspid Valve: The tricuspid valve is not well visualized. Tricuspid valve regurgitation is not demonstrated. No evidence of tricuspid stenosis. Aortic Valve: The aortic valve was not well visualized. Aortic valve regurgitation is not visualized. No aortic stenosis is present. Pulmonic Valve: The pulmonic valve was not well visualized. Pulmonic valve regurgitation is not visualized. No evidence of pulmonic stenosis. Aorta: The aortic root and ascending aorta are structurally normal, with no evidence of dilitation. Venous: The inferior vena cava is dilated in size with less than 50% respiratory variability, suggesting right atrial pressure of 15 mmHg. IAS/Shunts: No atrial level shunt detected by color flow Doppler.  LEFT VENTRICLE PLAX 2D                        Biplane EF (MOD) LVIDd:         3.70 cm         LV Biplane EF:   Left LVIDs:         2.70 cm                          ventricular LV PW:         1.10 cm                          ejection LV IVS:        1.10 cm  fraction by LVOT diam:     2.30 cm                          2D MOD LV SV:         59                               biplane is LV SV Index:   28                               54.2 %. LVOT Area:     4.15 cm                                Diastology                                LV e'  medial:    5.22 cm/s LV Volumes (MOD)               LV E/e' medial:  9.2 LV vol d, MOD    102.0 ml      LV e' lateral:   7.40 cm/s A2C:                           LV E/e' lateral: 6.5 LV vol d, MOD    79.2 ml A4C: LV vol s, MOD    45.4 ml A2C: LV vol s, MOD    36.4 ml A4C: LV SV MOD A2C:   56.6 ml LV SV MOD A4C:   79.2 ml LV SV MOD BP:    49.8 ml RIGHT VENTRICLE             IVC RV S prime:     11.30 cm/s  IVC diam: 2.70 cm TAPSE (M-mode): 1.5 cm LEFT ATRIUM             Index        RIGHT ATRIUM           Index LA diam:        3.60 cm 1.67 cm/m   RA Area:     26.00 cm LA Vol (A2C):   30.3 ml 14.10 ml/m  RA Volume:   83.90 ml  39.03 ml/m LA Vol (A4C):   27.0 ml 12.56 ml/m LA Biplane Vol: 29.1 ml 13.54 ml/m  AORTIC VALVE LVOT Vmax:   100.00 cm/s LVOT Vmean:  62.600 cm/s LVOT VTI:    0.143 m  AORTA Ao Root diam: 3.45 cm Ao Asc diam:  3.70 cm MITRAL VALVE MV Area (PHT): 3.65 cm    SHUNTS MV Decel Time: 208 msec    Systemic VTI:  0.14 m MV E velocity: 47.90 cm/s  Systemic Diam: 2.30 cm MV A velocity: 72.40 cm/s MV E/A ratio:  0.66 Zoila Shutter MD Electronically signed by Zoila Shutter MD Signature Date/Time: 09/16/2023/11:35:25 AM    Final    CT Angio Chest PE W and/or Wo Contrast Result Date: 09/15/2023 CLINICAL DATA:  Shortness of breath.  Hypoxia and fatigue EXAM: CT ANGIOGRAPHY CHEST WITH CONTRAST TECHNIQUE: Multidetector CT imaging of the chest was performed using the standard protocol during bolus administration of intravenous contrast. Multiplanar CT image  reconstructions and MIPs were obtained to evaluate the vascular anatomy. RADIATION DOSE REDUCTION: This exam was performed according to the departmental dose-optimization program which includes automated exposure control, adjustment of the mA and/or kV according to patient size and/or use of iterative reconstruction technique. CONTRAST:  75mL OMNIPAQUE IOHEXOL 350 MG/ML SOLN COMPARISON:  X-ray earlier 09/15/2023 and older FINDINGS: Cardiovascular:  Bilateral pulmonary emboli identified including some central clot within the main left pulmonary artery. Large areas of thrombus seen in the central lobar vessels on the left as well as segmental vessels and peripheral. There is also central clot within the pulmonary artery extending to the right lower lobe, lobar, segmental. More mild areas thrombi in the right upper lobe. Large clot burden. There is some flattening of the intraventricular septum. Please correlate for other clinical findings of right heart strain. Heart itself is nonenlarged. There is no reflux of contrast into the intrahepatic IVC. Trace pericardial fluid. Thoracic aorta has a normal course and caliber with slight partially calcified plaque. There is a bovine type aortic arch, normal variant. Mediastinum/Nodes: Large hiatal hernia. Slightly patulous esophagus. Preserved thyroid gland. No specific abnormal lymph node enlargement identified in the axillary regions, hilum or mediastinum. Only a few small less than 1 cm size right hilar nodes are seen and mediastinal nodes, not pathologic by size criteria. Lungs/Pleura: There is a small right pleural effusion with some lung base opacities. Infiltrate is possible. With the pulmonary embolus a developing infarct is also in the differential. Recommend simple follow-up. Please correlate with clinical presentation. No pneumothorax. No left-sided effusion or consolidation. Some breathing motion. Upper Abdomen: Adrenal glands are grossly preserved in the upper abdomen. Lobular contour to the spleen, nonspecific. Musculoskeletal: Scattered degenerative changes seen along the spine. Critical Value/emergent results were called by telephone at the time of interpretation on 09/15/2023 at 6:29 pm to provider [facility] , who verbally acknowledged these results. Review of the MIP images confirms the above findings. IMPRESSION: Pulmonary emboli bilaterally including some large central areas. Extensive distribution  of thrombus along the lower lobes greater than left upper lobe greater than right upper lobe. There is also some flattening of the intraventricular septum. No reflux of contrast into the intrahepatic IVC. Please correlate with any clinical evidence of right heart strain. Small right pleural effusion with adjacent lung opacity. This would have a differential including infiltrate. Infarct would also be in the differential with the level of pulmonary emboli. Aortic Atherosclerosis (ICD10-I70.0). Electronically Signed   By: Karen Kays M.D.   On: 09/15/2023 18:31   DG Chest Port 1 View Result Date: 09/15/2023 CLINICAL DATA:  Short of breath, hypoxia, fatigue EXAM: PORTABLE CHEST 1 VIEW COMPARISON:  02/11/2018 FINDINGS: Single frontal view of the chest demonstrates a stable cardiac silhouette. Patchy opacities are seen at the right lung base, which could reflect pneumonia or atelectasis. Left chest is clear. No effusion or pneumothorax. IMPRESSION: 1. Patchy right basilar consolidation, which could be hypoventilatory or related to bronchopneumonia. Electronically Signed   By: Sharlet Salina M.D.   On: 09/15/2023 16:57    Labs:  CBC: Recent Labs    09/15/23 1607 09/16/23 0553  WBC 13.9* 11.7*  HGB 7.7* 7.4*  HCT 30.2* 28.6*  PLT 692* 606*    COAGS: Recent Labs    09/15/23 1607 09/16/23 0553  INR 1.2 1.2  APTT  --  31    BMP: Recent Labs    09/15/23 1607 09/16/23 0553  NA 137 138  K 4.1 3.8  CL 102 103  CO2 23 22  GLUCOSE 137* 104*  BUN 16 14  CALCIUM 8.9 8.6*  CREATININE 0.95 0.89  GFRNONAA >60 >60    LIVER FUNCTION TESTS: Recent Labs    09/15/23 1607  BILITOT 0.7  AST 12*  ALT 16  ALKPHOS 81  PROT 8.5*  ALBUMIN 2.8*    TUMOR MARKERS: No results for input(s): "AFPTM", "CEA", "CA199", "CHROMGRNA" in the last 8760 hours.  Assessment and Plan:  Pulmonary emboli with right heart strain: Jeffrey Potter, 63 year old male, is scheduled today for an image-guided  catheter-directed thrombolysis of pulmonary emboli. The procedure was discussed at the bedside with the patient and his sister. The patient is aware of the required period of bedrest following placement of the lysis catheters. The patient's hemoglobin level this morning was 7.4  and Dr. Leafy Half has ordered one unit of PRBCs. The patient is currently an on a heparin infusion.   Risks and benefits discussed with the patient including, but not limited to bleeding, possible life threatening bleeding and need for blood product transfusion, vascular injury, stroke, contrast induced renal failure, limb loss and infection.  All of the patient's questions were answered, patient is agreeable to proceed. He has been NPO since 0800.   Consent signed and in IR  Thank you for this interesting consult.  I greatly enjoyed meeting ESTEL SCHOLZE and look forward to participating in their care.  A copy of this report was sent to the requesting provider on this date.  Electronically Signed: Alwyn Ren, AGACNP-BC 09/16/2023, 1:00 PM   I spent a total of 40 Minutes    in face to face in clinical consultation, greater than 50% of which was counseling/coordinating care for pulmonary emboli.

## 2023-09-17 ENCOUNTER — Inpatient Hospital Stay (HOSPITAL_COMMUNITY): Payer: MEDICAID

## 2023-09-17 DIAGNOSIS — Z86711 Personal history of pulmonary embolism: Secondary | ICD-10-CM

## 2023-09-17 HISTORY — PX: IR THROMB F/U EVAL ART/VEN FINAL DAY (MS): IMG5379

## 2023-09-17 LAB — MAGNESIUM: Magnesium: 1.9 mg/dL (ref 1.7–2.4)

## 2023-09-17 LAB — COMPREHENSIVE METABOLIC PANEL WITH GFR
ALT: 12 U/L (ref 0–44)
AST: 13 U/L — ABNORMAL LOW (ref 15–41)
Albumin: 2.6 g/dL — ABNORMAL LOW (ref 3.5–5.0)
Alkaline Phosphatase: 73 U/L (ref 38–126)
Anion gap: 10 (ref 5–15)
BUN: 9 mg/dL (ref 8–23)
CO2: 21 mmol/L — ABNORMAL LOW (ref 22–32)
Calcium: 8.4 mg/dL — ABNORMAL LOW (ref 8.9–10.3)
Chloride: 104 mmol/L (ref 98–111)
Creatinine, Ser: 0.68 mg/dL (ref 0.61–1.24)
GFR, Estimated: >60 mL/min (ref >60)
Glucose, Bld: 117 mg/dL — ABNORMAL HIGH (ref 70–99)
Potassium: 3.7 mmol/L (ref 3.5–5.1)
Sodium: 135 mmol/L (ref 135–145)
Total Bilirubin: 0.8 mg/dL (ref 0.0–1.2)
Total Protein: 7.5 g/dL (ref 6.5–8.1)

## 2023-09-17 LAB — CBC
HCT: 32.8 % — ABNORMAL LOW (ref 39.0–52.0)
HCT: 31.1 % — ABNORMAL LOW (ref 39.0–52.0)
HCT: 34.7 % — ABNORMAL LOW (ref 39.0–52.0)
Hemoglobin: 8.8 g/dL — ABNORMAL LOW (ref 13.0–17.0)
Hemoglobin: 8.2 g/dL — ABNORMAL LOW (ref 13.0–17.0)
Hemoglobin: 8.8 g/dL — ABNORMAL LOW (ref 13.0–17.0)
MCH: 18.2 pg — ABNORMAL LOW (ref 26.0–34.0)
MCH: 17.9 pg — ABNORMAL LOW (ref 26.0–34.0)
MCH: 18 pg — ABNORMAL LOW (ref 26.0–34.0)
MCHC: 26.8 g/dL — ABNORMAL LOW (ref 30.0–36.0)
MCHC: 26.4 g/dL — ABNORMAL LOW (ref 30.0–36.0)
MCHC: 25.4 g/dL — ABNORMAL LOW (ref 30.0–36.0)
MCV: 67.8 fL — ABNORMAL LOW (ref 80.0–100.0)
MCV: 68.1 fL — ABNORMAL LOW (ref 80.0–100.0)
MCV: 71 fL — ABNORMAL LOW (ref 80.0–100.0)
Platelets: 481 10*3/uL — ABNORMAL HIGH (ref 150–400)
Platelets: 447 10*3/uL — ABNORMAL HIGH (ref 150–400)
Platelets: 453 10*3/uL — ABNORMAL HIGH (ref 150–400)
RBC: 4.84 MIL/uL (ref 4.22–5.81)
RBC: 4.57 MIL/uL (ref 4.22–5.81)
RBC: 4.89 MIL/uL (ref 4.22–5.81)
RDW: 24.4 % — ABNORMAL HIGH (ref 11.5–15.5)
RDW: 24.6 % — ABNORMAL HIGH (ref 11.5–15.5)
RDW: 25.4 % — ABNORMAL HIGH (ref 11.5–15.5)
WBC: 12.4 10*3/uL — ABNORMAL HIGH (ref 4.0–10.5)
WBC: 13.1 10*3/uL — ABNORMAL HIGH (ref 4.0–10.5)
WBC: 11.6 10*3/uL — ABNORMAL HIGH (ref 4.0–10.5)
nRBC: 0.3 % — ABNORMAL HIGH (ref 0.0–0.2)
nRBC: 0.2 % (ref 0.0–0.2)
nRBC: 0.3 % — ABNORMAL HIGH (ref 0.0–0.2)

## 2023-09-17 LAB — HEPARIN LEVEL (UNFRACTIONATED)
Heparin Unfractionated: 0.25 [IU]/mL — ABNORMAL LOW (ref 0.30–0.70)
Heparin Unfractionated: <0.1 [IU]/mL — ABNORMAL LOW (ref 0.30–0.70)
Heparin Unfractionated: 0.28 [IU]/mL — ABNORMAL LOW (ref 0.30–0.70)

## 2023-09-17 LAB — FIBRINOGEN
Fibrinogen: 718 mg/dL — ABNORMAL HIGH (ref 210–475)
Fibrinogen: 666 mg/dL — ABNORMAL HIGH (ref 210–475)
Fibrinogen: 663 mg/dL — ABNORMAL HIGH (ref 210–475)

## 2023-09-17 MED ORDER — AMLODIPINE BESYLATE 5 MG PO TABS
5.0000 mg | ORAL_TABLET | Freq: Once | ORAL | Status: AC
  Administered 2023-09-17: 5 mg via ORAL
  Filled 2023-09-17: qty 1

## 2023-09-17 MED ORDER — AMLODIPINE BESYLATE 10 MG PO TABS
10.0000 mg | ORAL_TABLET | Freq: Every morning | ORAL | Status: DC
  Administered 2023-09-18 – 2023-09-19 (×2): 10 mg via ORAL
  Filled 2023-09-17 (×2): qty 1

## 2023-09-17 MED ORDER — ZOLPIDEM TARTRATE 5 MG PO TABS
5.0000 mg | ORAL_TABLET | Freq: Every day | ORAL | Status: DC
  Administered 2023-09-17 – 2023-09-18 (×2): 5 mg via ORAL
  Filled 2023-09-17 (×2): qty 1

## 2023-09-17 MED ORDER — HEPARIN BOLUS VIA INFUSION
3000.0000 [IU] | Freq: Once | INTRAVENOUS | Status: AC
  Administered 2023-09-17: 3000 [IU] via INTRAVENOUS
  Filled 2023-09-17: qty 3000

## 2023-09-17 NOTE — Assessment & Plan Note (Signed)
 Identified on CT of abdomen and pelvis Reviewed with Dr. Loreta Ave Already on heparin which he feels is the appropriate therapy at this time.

## 2023-09-17 NOTE — Assessment & Plan Note (Signed)
.   Continuing home regimen of lipid lowering therapy.  

## 2023-09-17 NOTE — Assessment & Plan Note (Signed)
 Patient reports intermittent melena over at least the past year  Last EGD in 2021 for food impaction/stricture last colonoscopy in 2000 revealing multiple adenomatous polyps with patient due for repeat colonoscopy in 2023 that was not performed Patient was seen in gastroenterology clinic on 4/4 for this melena and iron deficiency anemia but upon being evaluated due to hypoxia and low hemoglobin patient was sent to [facility] emergency department for urgent evaluation. Suspect patient may be suffering from intermittent upper gastrointestinal bleeding based on patient's report of black stools  Transfusing 1 unit packed red blood cells in preparation for thrombolysis Patient is unfortunately too ill during this hospitalization to pursue endoscopic workup with gastroenterology and therefore we will recommend patient see close follow-up with them at time of discharge.   rectal exam done by ER doc was guaic negative per report.

## 2023-09-17 NOTE — Assessment & Plan Note (Signed)
 Secondary to submassive pulmonary embolism Supplemental oxygen wean to maintain oxygen saturations of 92 to 96%. Remainder of assessment and plan as above

## 2023-09-17 NOTE — Assessment & Plan Note (Signed)
 Secondary to right heart strain from submassive pulmonary embolism Unlikely to be secondary to plaque rupture

## 2023-09-17 NOTE — Assessment & Plan Note (Signed)
 Extensive bilateral pulmonary emboli concerning for submassive pulmonary embolism Elevated troponins of 338 and 324 with severely depressed right ventricular function on echocardiogram prompted need for thrombolysis.   Thrombolysis infusion complete this morning and catheter has since been removed.   Continuing intravenous heparin.   Anticipate patient may be transition from heparin to lifelong oral anticoagulation morning of 4/8.   PCCM consulted and continuing to follow, their assistance is appreciated.   Supplemental oxygen for associated acute hypoxic respiratory failure Monitoring closely in stepdown as patient is at high risk of rapid clinical decompensation, patient can likely be transferred out of the stepdown unit on 4/8. Concerning etiology, I am concerned for possible undiagnosed malignancy, possibly GI malignancy considering numerous adenomatous polyps identified in 2000 with patient failing to follow-up for the recommended repeat surveillance colonoscopy in 2023.  PSA unremarkable.  SPEP/UPEP pending.  CT imaging of abdomen pelvis reveals no other obvious malignancy.

## 2023-09-17 NOTE — Progress Notes (Signed)
 Patients name and DOB were verified. Right catheters pressure was measured at 37/1(12). Left catheter pressure was measured at 30/11(12). The catheters and sheaths were removed, pressure was held and hemostasis confirmed. RN checked site.

## 2023-09-17 NOTE — Progress Notes (Signed)
 NAME:  Jeffrey Potter, MRN:  [id number], DOB:  [birth year], LOS: 2 ADMISSION DATE:  09/15/2023, CONSULTATION DATE:  09/15/23 REFERRING MD:  Dr Rodena Medin, CHIEF COMPLAINT:  Pulmonary embolism   History of Present Illness:  Patient came in with worsening shortness of breath Has had shortness of breath for about 2 weeks, some cough, chest discomfort on the right side Symptoms did settle down after about 4 to 5 days. Last night 4 /3/25 had chest pain, lightheaded, shortness of breath. Not lightheaded today When seen for a visit with GI for evaluation for anemia, noted to have iron deficiency anemia by primary doctor sometime in December   Evaluation in the ED-noted to have PE with right heart strain   Denies any history of heart disease, no history of lung disease, never smoker No recent surgery No recent travel   Significant history of GI interventions, S rings, food impactions - While in the GI office today was noted to be hypotensive, low oxygen sats of 88%  Pertinent  Medical History   Past Medical History:  Diagnosis Date   GERD (gastroesophageal reflux disease)    High cholesterol    High cholesterol    Hypertension     Significant Hospital Events: Including procedures, antibiotic start and stop dates in addition to other pertinent events   09/15/2023-CT chest-pulmonary embolism right heart strain, interventricular septum flattening.  Pulmonary infarct 09/15/2023-echocardiogram-result pending 4/5 catheter directed lysis  Interim History / Subjective:  No overnight events On oxygen supplementation Denies pain or discomfort Has not been able to sleep, states he has not slept in 3 days  Objective   Blood pressure (!) 164/90, pulse 91, temperature 98.1 F (36.7 C), temperature source Oral, resp. rate (!) 26, height 5\' 10"  (1.778 m), weight 97.2 kg, SpO2 94%.        Intake/Output Summary (Last 24 hours) at 09/17/2023 0745 Last data filed at 09/17/2023 0600 Gross per 24 hour   Intake 1694.57 ml  Output 1600 ml  Net 94.57 ml   Filed Weights   09/15/23 1555 09/15/23 2200  Weight: 98.9 kg 97.2 kg    Examination: General: Elderly, does not appear to be in distress HENT: Moist oral mucosa Lungs: Decreased air movement bilaterally Cardiovascular: S1-S2 appreciated Abdomen: Soft, bowel sounds appreciated Extremities: No clubbing, no edema Neuro: Awake, alert, oriented GU: Fair output  Resolved Hospital Problem list     Assessment & Plan:   Submassive pulmonary embolism Pulmonary infect IVC clot - Catheter directed lysis 09/16/2023 - On anticoagulation - H&H have been stable  Severe anemia - No evidence of acute bleeding - Has history of iron deficiency anemia for which she was on iron pills - GI evaluation ongoing  Pulmonary infect - Has not really had a lot of pain or discomfort  Right heart dysfunction on echocardiogram secondary to pulmonary embolism  He is tolerating lyses well  Insomnia - Add Ambien nightly  Appreciate IR assistance in patient management  Best Practice (right click and "Reselect all SmartList Selections" daily)   Diet/type: Regular consistency (see orders) DVT prophylaxis systemic heparin Pressure ulcer(s): N/A GI prophylaxis: PPI Lines: N/A Foley:  N/A Code Status:  full code Last date of multidisciplinary goals of care discussion [09/16/2023]  Labs   CBC: Recent Labs  Lab 09/15/23 1607 09/16/23 0553 09/16/23 2132 09/17/23 0305  WBC 13.9* 11.7* 12.4* 12.4*  NEUTROABS 12.3*  --   --   --   HGB 7.7* 7.4* 8.8* 8.8*  HCT 30.2* 28.6*  32.6* 32.8*  MCV 65.7* 66.1* 68.6* 67.8*  PLT 692* 606* 521* 481*    Basic Metabolic Panel: Recent Labs  Lab 09/15/23 1607 09/16/23 0553 09/17/23 0305  NA 137 138 135  K 4.1 3.8 3.7  CL 102 103 104  CO2 23 22 21*  GLUCOSE 137* 104* 117*  BUN 16 14 9   CREATININE 0.95 0.89 0.68  CALCIUM 8.9 8.6* 8.4*  MG  --   --  1.9   GFR: Estimated Creatinine Clearance: 109.1  mL/min (by C-G formula based on SCr of 0.68 mg/dL). Recent Labs  Lab 09/15/23 1607 09/15/23 1628 09/15/23 1804 09/16/23 0553 09/16/23 2132 09/17/23 0305  WBC 13.9*  --   --  11.7* 12.4* 12.4*  LATICACIDVEN  --  2.0* 1.8  --   --   --     Liver Function Tests: Recent Labs  Lab 09/15/23 1607 09/17/23 0305  AST 12* 13*  ALT 16 12  ALKPHOS 81 73  BILITOT 0.7 0.8  PROT 8.5* 7.5  ALBUMIN 2.8* 2.6*   No results for input(s): "LIPASE", "AMYLASE" in the last 168 hours. No results for input(s): "AMMONIA" in the last 168 hours.  ABG No results found for: "PHART", "PCO2ART", "PO2ART", "HCO3", "TCO2", "ACIDBASEDEF", "O2SAT"   Coagulation Profile: Recent Labs  Lab 09/15/23 1607 09/16/23 0553  INR 1.2 1.2    Cardiac Enzymes: No results for input(s): "CKTOTAL", "CKMB", "CKMBINDEX", "TROPONINI" in the last 168 hours.  HbA1C: No results found for: "HGBA1C"  CBG: No results for input(s): "GLUCAP" in the last 168 hours.  Review of Systems:   Denies any pain or discomfort at present  Past Medical History:  He,  has a past medical history of GERD (gastroesophageal reflux disease), High cholesterol, High cholesterol, and Hypertension.   Surgical History:   Past Surgical History:  Procedure Laterality Date   BALLOON DILATION N/A 10/10/2017   Procedure: BALLOON DILATION;  Surgeon: Beverley Fiedler, MD;  Location: [facility];  Service: Gastroenterology;  Laterality: N/A;   ESOPHAGOGASTRODUODENOSCOPY N/A 08/18/2017   Procedure: ESOPHAGOGASTRODUODENOSCOPY (EGD);  Surgeon: Beverley Fiedler, MD;  Location: [facility];  Service: Gastroenterology;  Laterality: N/A;   ESOPHAGOGASTRODUODENOSCOPY N/A 09/17/2017   Procedure: ESOPHAGOGASTRODUODENOSCOPY (EGD);  Surgeon: Jeani Hawking, MD;  Location: [facility];  Service: Endoscopy;  Laterality: N/A;   ESOPHAGOGASTRODUODENOSCOPY N/A 10/08/2018   Procedure: ESOPHAGOGASTRODUODENOSCOPY (EGD);  Surgeon: Benancio Deeds, MD;  Location: [facility];  Service: Gastroenterology;  Laterality: N/A;   ESOPHAGOGASTRODUODENOSCOPY N/A 09/08/2019   Procedure: ESOPHAGOGASTRODUODENOSCOPY (EGD);  Surgeon: Hilarie Fredrickson, MD;  Location: [facility];  Service: Endoscopy;  Laterality: N/A;   ESOPHAGOGASTRODUODENOSCOPY (EGD) WITH PROPOFOL N/A 10/10/2017   Procedure: ESOPHAGOGASTRODUODENOSCOPY (EGD) WITH PROPOFOL;  Surgeon: Beverley Fiedler, MD;  Location: [facility];  Service: Gastroenterology;  Laterality: N/A;  with biopsy of gastric polyps   FOREIGN BODY REMOVAL  10/08/2018   Procedure: FOREIGN BODY REMOVAL;  Surgeon: Benancio Deeds, MD;  Location: [facility];  Service: Gastroenterology;;  Food Impaction   FOREIGN BODY REMOVAL  09/08/2019   Procedure: FOREIGN BODY REMOVAL;  Surgeon: Hilarie Fredrickson, MD;  Location: [facility];  Service: Endoscopy;;   IR ANGIOGRAM PULMONARY BILATERAL SELECTIVE  09/16/2023   IR INFUSION THROMBOL ARTERIAL INITIAL (MS)  09/16/2023   IR INFUSION THROMBOL ARTERIAL INITIAL (MS)  09/16/2023   IR US GUIDE VASC ACCESS RIGHT  09/16/2023   IR US GUIDE VASC ACCESS RIGHT  09/16/2023     Social History:   reports that he  has never smoked. He has never used smokeless tobacco. He reports current alcohol use. He reports that he does not use drugs.   Family History:  His family history includes Cancer in his father.   Allergies No Known Allergies   The patient is critically ill with multiple organ systems failure and requires high complexity decision making for assessment and support, frequent evaluation and titration of therapies, application of advanced monitoring technologies and extensive interpretation of multiple databases. Critical Care Time devoted to patient care services described in this note independent of APP/resident time (if applicable)  is 30 minutes.   Virl Diamond MD [facility] Personal pager: See Amion If unanswered, please page CCM On-call: #[phone]

## 2023-09-17 NOTE — Progress Notes (Signed)
 PHARMACY - ANTICOAGULATION CONSULT NOTE  Pharmacy Consult for Heparin Indication: pulmonary embolus  No Known Allergies  Patient Measurements: Height: 5\' 10"  (177.8 cm) Weight: 97.2 kg (214 lb 4.6 oz) IBW/kg (Calculated) : 73 HEPARIN DW (KG): 93  Vital Signs: Temp: 98.2 F (36.8 C) (04/06 1722) Temp Source: Oral (04/06 1722) BP: 147/72 (04/06 1600) Pulse Rate: 97 (04/06 1600)  Labs: Recent Labs    09/15/23 1607 09/15/23 1753 09/15/23 2251 09/16/23 0553 09/16/23 0912 09/17/23 0305 09/17/23 0939 09/17/23 1656  HGB 7.7*  --   --  7.4*   < > 8.8* 8.2* 8.8*  HCT 30.2*  --   --  28.6*   < > 32.8* 31.1* 34.7*  PLT 692*  --   --  606*   < > 481* 447* 453*  APTT  --   --   --  31  --   --   --   --   LABPROT 15.1  --   --  15.0  --   --   --   --   INR 1.2  --   --  1.2  --   --   --   --   HEPARINUNFRC  --   --    < >  --    < > 0.25* <0.10* 0.28*  CREATININE 0.95  --   --  0.89  --  0.68  --   --   TROPONINIHS 338* 324*  --   --   --   --   --   --    < > = values in this interval not displayed.    Estimated Creatinine Clearance: 109.1 mL/min (by C-G formula based on SCr of 0.68 mg/dL).   Assessment: 78 yoM presents to ED on 4/4 with worsening SOB x2 weeks.  CTa positive for PE. Pharmacy consulted to dose Heparin.  B Catheter directed lysis x 12  hours - completed @ 0340 am   09/17/2023 B catheter lysis w/ alteplase completed at 0340 am  Lysis catheters pulled at 08 am HL this PM is 0.28, subtherapeutic on heparin drip at  2300 units/hr Hgb 8.8, plts 453 fibrinogen 663 No bleeding noted per RN Per RN drip was not held or interrupted  Goal of Therapy:  Heparin 0.3-0.7 units/ml - confirmed increase to standard goal w/ J. Covington, NP Monitor platelets by anticoagulation protocol: Yes   Plan:  Increase heparin rate to 2450 units/hr Obtain HL 6 hours after rate change  CBC and fibrinogen x4 labs  - completed 4/6 at 1730 Daily heparin level and CBC   Adalberto Cole, PharmD, BCPS 09/17/2023 6:48 PM

## 2023-09-17 NOTE — Assessment & Plan Note (Signed)
 Resume home regimen of amlodipine As needed intravenous hydralazine for markedly elevated blood pressure

## 2023-09-17 NOTE — Progress Notes (Signed)
 Referring Physician(s): Dr. Wynona Neat  Supervising Physician: Gilmer Mor  Patient Status:  [facility] - In-pt  Chief Complaint: Pulmonary emboli with right heart strain s/p catheter-directed thrombolysis 09/16/23  Subjective: Patient sitting up in bed. The ICU nurse is in the room. He denies pain/discomfort.   Allergies: Patient has no known allergies.  Medications: Prior to Admission medications   Medication Sig Start Date End Date Taking? Authorizing Provider  amLODipine (NORVASC) 5 MG tablet Take 5 mg by mouth in the morning.   Yes [provider]  iron polysaccharides (NIFEREX) 150 MG capsule Take 150 mg by mouth. Take 1 capsule 3 times a week. Monday Wednesday friday 07/05/23  Yes [provider]  lovastatin (MEVACOR) 20 MG tablet Take 40 mg by mouth in the morning.   Yes [provider]  Omega-3 Fatty Acids (FISH OIL) 1000 MG CAPS Take 1 capsule by mouth daily. 09/03/19  Yes [provider]  omeprazole (PRILOSEC OTC) 20 MG tablet Take 20 mg by mouth in the morning.   Yes [provider]  Potassium Chloride ER 20 MEQ TBCR Take 1 tablet by mouth daily. 07/06/23  Yes [provider]  amoxicillin (AMOXIL) 500 MG capsule Take 500 mg by mouth 3 (three) times daily. Patient not taking: Reported on 09/16/2023 08/16/23   [provider]     Vital Signs: BP (!) 164/90   Pulse 91   Temp 98.1 F (36.7 C) (Oral)   Resp (!) 26   Ht 5\' 10"  (1.778 m)   Wt 214 lb 4.6 oz (97.2 kg)   SpO2 94%   BMI 30.75 kg/m   Physical Exam Constitutional:      General: He is not in acute distress.    Appearance: He is not ill-appearing.  Cardiovascular:     Rate and Rhythm: Normal rate.     Comments: Right internal jugular site is covered with gauze/tegaderm. It is clean/dry Pulmonary:     Effort: Pulmonary effort is normal.  Genitourinary:    Comments: Foley catheter with clear yellow urine in gravity bag.  Neurological:     Mental  Status: He is alert and oriented to person, place, and time.  Psychiatric:        Mood and Affect: Mood normal.        Behavior: Behavior normal.        Thought Content: Thought content normal.        Judgment: Judgment normal.     Imaging: IR Angiogram Pulmonary Bilateral Selective Result Date: 09/16/2023 INDICATION: 63 year old male with intermediate high risk pulmonary embolism presents for therapy EXAM: ULTRASOUND-GUIDED RIGHT INTERNAL JUGULAR VEIN ACCESS X2 PULMONARY ARTERY ANGIOGRAM AND PRESSURE MEASUREMENT BILATERAL PULMONARY ARTERY CATHETER PLACEMENT INITIATION OF THROMBOLYSIS COMPARISON:  None Available. MEDICATIONS: None. ANESTHESIA/SEDATION: Moderate (conscious) sedation was employed during this procedure. A total of Versed 1.5 mg and Fentanyl 75 mcg was administered intravenously by the radiology nurse. Total intra-service moderate Sedation Time: 55 minutes. The patient's level of consciousness and vital signs were monitored continuously by radiology nursing throughout the procedure under my direct supervision. FLUOROSCOPY: Radiation Exposure Index (as provided by the fluoroscopic device): 303 mGy Kerma COMPLICATIONS: None TECHNIQUE: Informed consent was obtained from the patient and the patient's family following explanation of the procedure, risks, benefits and alternatives. Specific risks include bleeding, infection, contrast reaction, kidney injury, venous injury, life-threatening hemorrhage including brain hemorrhage, gastrointestinal hemorrhage, epistaxis, need for further surgery, need for further procedure, cardiopulmonary collapse, death. The patient understands, agrees and  consents for the procedure. All questions were addressed. Patient positioned supine position on the fluoroscopy table. Maximal barrier sterile technique utilized including caps, mask, sterile gowns, sterile gloves, large sterile drape, hand hygiene, and chlorhexidine prep. 1% lidocaine used for local anesthesia.  Moderate sedation was provided. Ultrasound survey of the right neck was performed with images stored and sent to PACs, confirming patency of the right internal jugular vein. A micropuncture needle was used access the right internal jugular vein under ultrasound. With venous blood flow returned, an .018 micro wire was passed through the needle, observed enter right heart under fluoroscopy. The needle was removed, and a micropuncture sheath was placed over the wire. The inner dilator and wire were removed, and an 035 Bentson wire was advanced under fluoroscopy into the IVC. The 3 French access was removed on the wire and then a standard 6 French sheath was placed. The dilator was removed and the sheath was flushed. We then proceeded with a tandem venous access for the second catheter. The micro puncture needle was used access the right internal jugular vein under ultrasound. With venous blood flow returned, an .018 micro wire was passed through the needle, observed enter right heart under fluoroscopy. The needle was removed, and a micropuncture sheath was placed over the wire. The inner dilator and wire were removed, and an 035 Bentson wire was advanced under fluoroscopy into the IVC. The 3 French access was removed on the wire and then a second 6 French sheath was placed. The dilator was removed and the sheath was flushed. Combination of a angled pigtail catheter and a Bentson wire was then used to attempt 2 navigate to the pulmonary artery. The acute angle through the right atrium/tricuspid valve was difficult for the angled pigtail catheter, despite using an angled Bentson wire for additional acute angulation. Ultimately, after several episodes of short ventricular tachycardia produced by a the manipulation of the pigtail catheter, we decided to advance a Glidewire from the position of the pigtail catheter in the right atrium through the outflow tract. The Glidewire ultimately positioned itself within the right  pulmonary artery, downgoing branches. The pigtail catheter was removed and we elected to place a 90 cm working length, 15 cm infusion length UniFuse catheter. The catheter was advanced on the Glidewire, position into the downgoing branches, and the operator wire was placed. The alternative sheath was then used for selection of the contralateral pulmonary artery. The angled pigtail catheter was again advanced centrally, attempting to passed through tricuspid valve and through the right ventricle. After initiating another short run of ventricular tachycardia, we elected to use the same technique. The Bentson wire was removed and stiff glidewire was advanced through the pulmonary outflow tract. Once the Glidewire was in the right-sided pulmonary arteries, the pigtail catheter was advanced into the main pulmonary artery and the Glidewire was removed for pressure measurement. Pressure measurement was performed. Glidewire was then again advanced through the pigtail catheter. The angled pigtail catheter was removed and a 65 cm Kumpe catheter was advanced on the Glidewire. This was used to manipulate the Glidewire into the downgoing branches of the left pulmonary arterial system. Kumpe the catheter was removed. A 15 cm infusion length 90cm working length UniFuse catheter was placed into the left pulmonary arteries. Wire was removed, small amount of contrast confirmed position and catheter was flushed. The obturator wires were then placed through both the left and right catheters. Sheaths were secured in position.  Final image was stored. The patient tolerated  the procedure well and remained hemodynamically stable throughout. No complications were encountered and no significant blood loss was encountered. FINDINGS: Pulmonary artery pressure: 66/31 (44) IMPRESSION: Status post US guided right internal jugular vein access and placement of left and right pulmonary artery lytic catheters for initiation of catheter directed  thrombolysis. Signed, Yvone Neu. Miachel Roux, RPVI [facility] Radiology PLAN: Patient will be ICU status overnight. Bed rest. Both the right and left catheter will receive 1 mg tPA per hour for 12 hours, for a total dose of 24 mg over 12 hours. Plan for repeat trans duction of catheter pressures after treatment, reassessment, and probable removal of catheters. EVery 6 hour blood draw, including CBC, heparin level, and fibrinogen. Electronically Signed   By: Gilmer Mor D.O.   On: 09/16/2023 16:13   IR INFUSION THROMBOL ARTERIAL INITIAL (MS) Result Date: 09/16/2023 INDICATION: 63 year old male with intermediate high risk pulmonary embolism presents for therapy EXAM: ULTRASOUND-GUIDED RIGHT INTERNAL JUGULAR VEIN ACCESS X2 PULMONARY ARTERY ANGIOGRAM AND PRESSURE MEASUREMENT BILATERAL PULMONARY ARTERY CATHETER PLACEMENT INITIATION OF THROMBOLYSIS COMPARISON:  None Available. MEDICATIONS: None. ANESTHESIA/SEDATION: Moderate (conscious) sedation was employed during this procedure. A total of Versed 1.5 mg and Fentanyl 75 mcg was administered intravenously by the radiology nurse. Total intra-service moderate Sedation Time: 55 minutes. The patient's level of consciousness and vital signs were monitored continuously by radiology nursing throughout the procedure under my direct supervision. FLUOROSCOPY: Radiation Exposure Index (as provided by the fluoroscopic device): 303 mGy Kerma COMPLICATIONS: None TECHNIQUE: Informed consent was obtained from the patient and the patient's family following explanation of the procedure, risks, benefits and alternatives. Specific risks include bleeding, infection, contrast reaction, kidney injury, venous injury, life-threatening hemorrhage including brain hemorrhage, gastrointestinal hemorrhage, epistaxis, need for further surgery, need for further procedure, cardiopulmonary collapse, death. The patient understands, agrees and  consents for the procedure. All questions were addressed. Patient positioned supine position on the fluoroscopy table. Maximal barrier sterile technique utilized including caps, mask, sterile gowns, sterile gloves, large sterile drape, hand hygiene, and chlorhexidine prep. 1% lidocaine used for local anesthesia. Moderate sedation was provided. Ultrasound survey of the right neck was performed with images stored and sent to PACs, confirming patency of the right internal jugular vein. A micropuncture needle was used access the right internal jugular vein under ultrasound. With venous blood flow returned, an .018 micro wire was passed through the needle, observed enter right heart under fluoroscopy. The needle was removed, and a micropuncture sheath was placed over the wire. The inner dilator and wire were removed, and an 035 Bentson wire was advanced under fluoroscopy into the IVC. The 3 French access was removed on the wire and then a standard 6 French sheath was placed. The dilator was removed and the sheath was flushed. We then proceeded with a tandem venous access for the second catheter. The micro puncture needle was used access the right internal jugular vein under ultrasound. With venous blood flow returned, an .018 micro wire was passed through the needle, observed enter right heart under fluoroscopy. The needle was removed, and a micropuncture sheath was placed over the wire. The inner dilator and wire were removed, and an 035 Bentson wire was advanced under fluoroscopy into the IVC. The 3 French access was removed on the wire and then a second 6 French sheath was placed. The dilator was removed and the sheath was flushed. Combination of a angled pigtail catheter and a Bentson wire was then used to attempt  2 navigate to the pulmonary artery. The acute angle through the right atrium/tricuspid valve was difficult for the angled pigtail catheter, despite using an angled Bentson wire for additional acute  angulation. Ultimately, after several episodes of short ventricular tachycardia produced by a the manipulation of the pigtail catheter, we decided to advance a Glidewire from the position of the pigtail catheter in the right atrium through the outflow tract. The Glidewire ultimately positioned itself within the right pulmonary artery, downgoing branches. The pigtail catheter was removed and we elected to place a 90 cm working length, 15 cm infusion length UniFuse catheter. The catheter was advanced on the Glidewire, position into the downgoing branches, and the operator wire was placed. The alternative sheath was then used for selection of the contralateral pulmonary artery. The angled pigtail catheter was again advanced centrally, attempting to passed through tricuspid valve and through the right ventricle. After initiating another short run of ventricular tachycardia, we elected to use the same technique. The Bentson wire was removed and stiff glidewire was advanced through the pulmonary outflow tract. Once the Glidewire was in the right-sided pulmonary arteries, the pigtail catheter was advanced into the main pulmonary artery and the Glidewire was removed for pressure measurement. Pressure measurement was performed. Glidewire was then again advanced through the pigtail catheter. The angled pigtail catheter was removed and a 65 cm Kumpe catheter was advanced on the Glidewire. This was used to manipulate the Glidewire into the downgoing branches of the left pulmonary arterial system. Kumpe the catheter was removed. A 15 cm infusion length 90cm working length UniFuse catheter was placed into the left pulmonary arteries. Wire was removed, small amount of contrast confirmed position and catheter was flushed. The obturator wires were then placed through both the left and right catheters. Sheaths were secured in position.  Final image was stored. The patient tolerated the procedure well and remained hemodynamically  stable throughout. No complications were encountered and no significant blood loss was encountered. FINDINGS: Pulmonary artery pressure: 66/31 (44) IMPRESSION: Status post US guided right internal jugular vein access and placement of left and right pulmonary artery lytic catheters for initiation of catheter directed thrombolysis. Signed, Yvone Neu. Miachel Roux, RPVI [facility] Radiology PLAN: Patient will be ICU status overnight. Bed rest. Both the right and left catheter will receive 1 mg tPA per hour for 12 hours, for a total dose of 24 mg over 12 hours. Plan for repeat trans duction of catheter pressures after treatment, reassessment, and probable removal of catheters. EVery 6 hour blood draw, including CBC, heparin level, and fibrinogen. Electronically Signed   By: Gilmer Mor D.O.   On: 09/16/2023 16:13   IR INFUSION THROMBOL ARTERIAL INITIAL (MS) Result Date: 09/16/2023 INDICATION: 63 year old male with intermediate high risk pulmonary embolism presents for therapy EXAM: ULTRASOUND-GUIDED RIGHT INTERNAL JUGULAR VEIN ACCESS X2 PULMONARY ARTERY ANGIOGRAM AND PRESSURE MEASUREMENT BILATERAL PULMONARY ARTERY CATHETER PLACEMENT INITIATION OF THROMBOLYSIS COMPARISON:  None Available. MEDICATIONS: None. ANESTHESIA/SEDATION: Moderate (conscious) sedation was employed during this procedure. A total of Versed 1.5 mg and Fentanyl 75 mcg was administered intravenously by the radiology nurse. Total intra-service moderate Sedation Time: 55 minutes. The patient's level of consciousness and vital signs were monitored continuously by radiology nursing throughout the procedure under my direct supervision. FLUOROSCOPY: Radiation Exposure Index (as provided by the fluoroscopic device): 303 mGy Kerma COMPLICATIONS: None TECHNIQUE: Informed consent was obtained from the patient and the patient's family following explanation of the procedure, risks, benefits and  alternatives.  Specific risks include bleeding, infection, contrast reaction, kidney injury, venous injury, life-threatening hemorrhage including brain hemorrhage, gastrointestinal hemorrhage, epistaxis, need for further surgery, need for further procedure, cardiopulmonary collapse, death. The patient understands, agrees and consents for the procedure. All questions were addressed. Patient positioned supine position on the fluoroscopy table. Maximal barrier sterile technique utilized including caps, mask, sterile gowns, sterile gloves, large sterile drape, hand hygiene, and chlorhexidine prep. 1% lidocaine used for local anesthesia. Moderate sedation was provided. Ultrasound survey of the right neck was performed with images stored and sent to PACs, confirming patency of the right internal jugular vein. A micropuncture needle was used access the right internal jugular vein under ultrasound. With venous blood flow returned, an .018 micro wire was passed through the needle, observed enter right heart under fluoroscopy. The needle was removed, and a micropuncture sheath was placed over the wire. The inner dilator and wire were removed, and an 035 Bentson wire was advanced under fluoroscopy into the IVC. The 3 French access was removed on the wire and then a standard 6 French sheath was placed. The dilator was removed and the sheath was flushed. We then proceeded with a tandem venous access for the second catheter. The micro puncture needle was used access the right internal jugular vein under ultrasound. With venous blood flow returned, an .018 micro wire was passed through the needle, observed enter right heart under fluoroscopy. The needle was removed, and a micropuncture sheath was placed over the wire. The inner dilator and wire were removed, and an 035 Bentson wire was advanced under fluoroscopy into the IVC. The 3 French access was removed on the wire and then a second 6 French sheath was placed. The dilator was removed and the  sheath was flushed. Combination of a angled pigtail catheter and a Bentson wire was then used to attempt 2 navigate to the pulmonary artery. The acute angle through the right atrium/tricuspid valve was difficult for the angled pigtail catheter, despite using an angled Bentson wire for additional acute angulation. Ultimately, after several episodes of short ventricular tachycardia produced by a the manipulation of the pigtail catheter, we decided to advance a Glidewire from the position of the pigtail catheter in the right atrium through the outflow tract. The Glidewire ultimately positioned itself within the right pulmonary artery, downgoing branches. The pigtail catheter was removed and we elected to place a 90 cm working length, 15 cm infusion length UniFuse catheter. The catheter was advanced on the Glidewire, position into the downgoing branches, and the operator wire was placed. The alternative sheath was then used for selection of the contralateral pulmonary artery. The angled pigtail catheter was again advanced centrally, attempting to passed through tricuspid valve and through the right ventricle. After initiating another short run of ventricular tachycardia, we elected to use the same technique. The Bentson wire was removed and stiff glidewire was advanced through the pulmonary outflow tract. Once the Glidewire was in the right-sided pulmonary arteries, the pigtail catheter was advanced into the main pulmonary artery and the Glidewire was removed for pressure measurement. Pressure measurement was performed. Glidewire was then again advanced through the pigtail catheter. The angled pigtail catheter was removed and a 65 cm Kumpe catheter was advanced on the Glidewire. This was used to manipulate the Glidewire into the downgoing branches of the left pulmonary arterial system. Kumpe the catheter was removed. A 15 cm infusion length 90cm working length UniFuse catheter was placed into the left pulmonary  arteries. Wire was  removed, small amount of contrast confirmed position and catheter was flushed. The obturator wires were then placed through both the left and right catheters. Sheaths were secured in position.  Final image was stored. The patient tolerated the procedure well and remained hemodynamically stable throughout. No complications were encountered and no significant blood loss was encountered. FINDINGS: Pulmonary artery pressure: 66/31 (44) IMPRESSION: Status post US guided right internal jugular vein access and placement of left and right pulmonary artery lytic catheters for initiation of catheter directed thrombolysis. Signed, Yvone Neu. Miachel Roux, RPVI [facility] Radiology PLAN: Patient will be ICU status overnight. Bed rest. Both the right and left catheter will receive 1 mg tPA per hour for 12 hours, for a total dose of 24 mg over 12 hours. Plan for repeat trans duction of catheter pressures after treatment, reassessment, and probable removal of catheters. EVery 6 hour blood draw, including CBC, heparin level, and fibrinogen. Electronically Signed   By: Gilmer Mor D.O.   On: 09/16/2023 16:13   IR US Guide Vasc Access Right Result Date: 09/16/2023 INDICATION: 63 year old male with intermediate high risk pulmonary embolism presents for therapy EXAM: ULTRASOUND-GUIDED RIGHT INTERNAL JUGULAR VEIN ACCESS X2 PULMONARY ARTERY ANGIOGRAM AND PRESSURE MEASUREMENT BILATERAL PULMONARY ARTERY CATHETER PLACEMENT INITIATION OF THROMBOLYSIS COMPARISON:  None Available. MEDICATIONS: None. ANESTHESIA/SEDATION: Moderate (conscious) sedation was employed during this procedure. A total of Versed 1.5 mg and Fentanyl 75 mcg was administered intravenously by the radiology nurse. Total intra-service moderate Sedation Time: 55 minutes. The patient's level of consciousness and vital signs were monitored continuously by radiology nursing throughout the procedure under  my direct supervision. FLUOROSCOPY: Radiation Exposure Index (as provided by the fluoroscopic device): 303 mGy Kerma COMPLICATIONS: None TECHNIQUE: Informed consent was obtained from the patient and the patient's family following explanation of the procedure, risks, benefits and alternatives. Specific risks include bleeding, infection, contrast reaction, kidney injury, venous injury, life-threatening hemorrhage including brain hemorrhage, gastrointestinal hemorrhage, epistaxis, need for further surgery, need for further procedure, cardiopulmonary collapse, death. The patient understands, agrees and consents for the procedure. All questions were addressed. Patient positioned supine position on the fluoroscopy table. Maximal barrier sterile technique utilized including caps, mask, sterile gowns, sterile gloves, large sterile drape, hand hygiene, and chlorhexidine prep. 1% lidocaine used for local anesthesia. Moderate sedation was provided. Ultrasound survey of the right neck was performed with images stored and sent to PACs, confirming patency of the right internal jugular vein. A micropuncture needle was used access the right internal jugular vein under ultrasound. With venous blood flow returned, an .018 micro wire was passed through the needle, observed enter right heart under fluoroscopy. The needle was removed, and a micropuncture sheath was placed over the wire. The inner dilator and wire were removed, and an 035 Bentson wire was advanced under fluoroscopy into the IVC. The 3 French access was removed on the wire and then a standard 6 French sheath was placed. The dilator was removed and the sheath was flushed. We then proceeded with a tandem venous access for the second catheter. The micro puncture needle was used access the right internal jugular vein under ultrasound. With venous blood flow returned, an .018 micro wire was passed through the needle, observed enter right heart under fluoroscopy. The needle was  removed, and a micropuncture sheath was placed over the wire. The inner dilator and wire were removed, and an 035 Bentson wire was advanced under fluoroscopy into the IVC. The 3 [place] access was  removed on the wire and then a second 6 French sheath was placed. The dilator was removed and the sheath was flushed. Combination of a angled pigtail catheter and a Bentson wire was then used to attempt 2 navigate to the pulmonary artery. The acute angle through the right atrium/tricuspid valve was difficult for the angled pigtail catheter, despite using an angled Bentson wire for additional acute angulation. Ultimately, after several episodes of short ventricular tachycardia produced by a the manipulation of the pigtail catheter, we decided to advance a Glidewire from the position of the pigtail catheter in the right atrium through the outflow tract. The Glidewire ultimately positioned itself within the right pulmonary artery, downgoing branches. The pigtail catheter was removed and we elected to place a 90 cm working length, 15 cm infusion length UniFuse catheter. The catheter was advanced on the Glidewire, position into the downgoing branches, and the operator wire was placed. The alternative sheath was then used for selection of the contralateral pulmonary artery. The angled pigtail catheter was again advanced centrally, attempting to passed through tricuspid valve and through the right ventricle. After initiating another short run of ventricular tachycardia, we elected to use the same technique. The Bentson wire was removed and stiff glidewire was advanced through the pulmonary outflow tract. Once the Glidewire was in the right-sided pulmonary arteries, the pigtail catheter was advanced into the main pulmonary artery and the Glidewire was removed for pressure measurement. Pressure measurement was performed. Glidewire was then again advanced through the pigtail catheter. The angled pigtail catheter was removed and a 65  cm Kumpe catheter was advanced on the Glidewire. This was used to manipulate the Glidewire into the downgoing branches of the left pulmonary arterial system. Kumpe the catheter was removed. A 15 cm infusion length 90cm working length UniFuse catheter was placed into the left pulmonary arteries. Wire was removed, small amount of contrast confirmed position and catheter was flushed. The obturator wires were then placed through both the left and right catheters. Sheaths were secured in position.  Final image was stored. The patient tolerated the procedure well and remained hemodynamically stable throughout. No complications were encountered and no significant blood loss was encountered. FINDINGS: Pulmonary artery pressure: 66/31 (44) IMPRESSION: Status post US guided right internal jugular vein access and placement of left and right pulmonary artery lytic catheters for initiation of catheter directed thrombolysis. Signed, Yvone Neu. Miachel Roux, RPVI [facility] Radiology PLAN: Patient will be ICU status overnight. Bed rest. Both the right and left catheter will receive 1 mg tPA per hour for 12 hours, for a total dose of 24 mg over 12 hours. Plan for repeat trans duction of catheter pressures after treatment, reassessment, and probable removal of catheters. EVery 6 hour blood draw, including CBC, heparin level, and fibrinogen. Electronically Signed   By: Gilmer Mor D.O.   On: 09/16/2023 16:13   IR US Guide Vasc Access Right Result Date: 09/16/2023 INDICATION: 63 year old male with intermediate high risk pulmonary embolism presents for therapy EXAM: ULTRASOUND-GUIDED RIGHT INTERNAL JUGULAR VEIN ACCESS X2 PULMONARY ARTERY ANGIOGRAM AND PRESSURE MEASUREMENT BILATERAL PULMONARY ARTERY CATHETER PLACEMENT INITIATION OF THROMBOLYSIS COMPARISON:  None Available. MEDICATIONS: None. ANESTHESIA/SEDATION: Moderate (conscious) sedation was employed during this procedure. A  total of Versed 1.5 mg and Fentanyl 75 mcg was administered intravenously by the radiology nurse. Total intra-service moderate Sedation Time: 55 minutes. The patient's level of consciousness and vital signs were monitored continuously by radiology nursing throughout the procedure under my  direct supervision. FLUOROSCOPY: Radiation Exposure Index (as provided by the fluoroscopic device): 303 mGy Kerma COMPLICATIONS: None TECHNIQUE: Informed consent was obtained from the patient and the patient's family following explanation of the procedure, risks, benefits and alternatives. Specific risks include bleeding, infection, contrast reaction, kidney injury, venous injury, life-threatening hemorrhage including brain hemorrhage, gastrointestinal hemorrhage, epistaxis, need for further surgery, need for further procedure, cardiopulmonary collapse, death. The patient understands, agrees and consents for the procedure. All questions were addressed. Patient positioned supine position on the fluoroscopy table. Maximal barrier sterile technique utilized including caps, mask, sterile gowns, sterile gloves, large sterile drape, hand hygiene, and chlorhexidine prep. 1% lidocaine used for local anesthesia. Moderate sedation was provided. Ultrasound survey of the right neck was performed with images stored and sent to PACs, confirming patency of the right internal jugular vein. A micropuncture needle was used access the right internal jugular vein under ultrasound. With venous blood flow returned, an .018 micro wire was passed through the needle, observed enter right heart under fluoroscopy. The needle was removed, and a micropuncture sheath was placed over the wire. The inner dilator and wire were removed, and an 035 Bentson wire was advanced under fluoroscopy into the IVC. The 3 French access was removed on the wire and then a standard 6 French sheath was placed. The dilator was removed and the sheath was flushed. We then proceeded  with a tandem venous access for the second catheter. The micro puncture needle was used access the right internal jugular vein under ultrasound. With venous blood flow returned, an .018 micro wire was passed through the needle, observed enter right heart under fluoroscopy. The needle was removed, and a micropuncture sheath was placed over the wire. The inner dilator and wire were removed, and an 035 Bentson wire was advanced under fluoroscopy into the IVC. The 3 French access was removed on the wire and then a second 6 French sheath was placed. The dilator was removed and the sheath was flushed. Combination of a angled pigtail catheter and a Bentson wire was then used to attempt 2 navigate to the pulmonary artery. The acute angle through the right atrium/tricuspid valve was difficult for the angled pigtail catheter, despite using an angled Bentson wire for additional acute angulation. Ultimately, after several episodes of short ventricular tachycardia produced by a the manipulation of the pigtail catheter, we decided to advance a Glidewire from the position of the pigtail catheter in the right atrium through the outflow tract. The Glidewire ultimately positioned itself within the right pulmonary artery, downgoing branches. The pigtail catheter was removed and we elected to place a 90 cm working length, 15 cm infusion length UniFuse catheter. The catheter was advanced on the Glidewire, position into the downgoing branches, and the operator wire was placed. The alternative sheath was then used for selection of the contralateral pulmonary artery. The angled pigtail catheter was again advanced centrally, attempting to passed through tricuspid valve and through the right ventricle. After initiating another short run of ventricular tachycardia, we elected to use the same technique. The Bentson wire was removed and stiff glidewire was advanced through the pulmonary outflow tract. Once the Glidewire was in the right-sided  pulmonary arteries, the pigtail catheter was advanced into the main pulmonary artery and the Glidewire was removed for pressure measurement. Pressure measurement was performed. Glidewire was then again advanced through the pigtail catheter. The angled pigtail catheter was removed and a 65 cm Kumpe catheter was advanced on the Glidewire. This was used to manipulate  the Glidewire into the downgoing branches of the left pulmonary arterial system. Kumpe the catheter was removed. A 15 cm infusion length 90cm working length UniFuse catheter was placed into the left pulmonary arteries. Wire was removed, small amount of contrast confirmed position and catheter was flushed. The obturator wires were then placed through both the left and right catheters. Sheaths were secured in position.  Final image was stored. The patient tolerated the procedure well and remained hemodynamically stable throughout. No complications were encountered and no significant blood loss was encountered. FINDINGS: Pulmonary artery pressure: 66/31 (44) IMPRESSION: Status post US guided right internal jugular vein access and placement of left and right pulmonary artery lytic catheters for initiation of catheter directed thrombolysis. Signed, Yvone Neu. Miachel Roux, RPVI [facility] Radiology PLAN: Patient will be ICU status overnight. Bed rest. Both the right and left catheter will receive 1 mg tPA per hour for 12 hours, for a total dose of 24 mg over 12 hours. Plan for repeat trans duction of catheter pressures after treatment, reassessment, and probable removal of catheters. EVery 6 hour blood draw, including CBC, heparin level, and fibrinogen. Electronically Signed   By: Gilmer Mor D.O.   On: 09/16/2023 16:13   CT ABDOMEN PELVIS W CONTRAST Result Date: 09/16/2023 CLINICAL DATA:  Acute pulmonary embolism. Evaluation for occult malignancy. * Tracking Code: BO * EXAM: CT ABDOMEN AND PELVIS WITH  CONTRAST TECHNIQUE: Multidetector CT imaging of the abdomen and pelvis was performed using the standard protocol following bolus administration of intravenous contrast. RADIATION DOSE REDUCTION: This exam was performed according to the departmental dose-optimization program which includes automated exposure control, adjustment of the mA and/or kV according to patient size and/or use of iterative reconstruction technique. CONTRAST:  OMNIPAQUE IOHEXOL 300 MG/ML  SOLN COMPARISON:  Chest CTA on 09/15/2023 FINDINGS: Lower chest: Pulmonary embolism again seen in the visualized lower lobe pulmonary arteries bilaterally. Small right pleural effusion and right lower lobe atelectasis or infarct show no significant change. Hepatobiliary: No hepatic masses identified. Gallbladder is unremarkable. No evidence of biliary ductal dilatation. Pancreas:  No mass or inflammatory changes. Spleen:  Within normal limits in size and appearance. Adrenals/Urinary Tract: No suspicious masses identified. No evidence of hydronephrosis. Bladder is unremarkable. Stomach/Bowel: Moderate to large hiatal hernia noted. No masses identified. No evidence of obstruction, inflammatory process or abnormal fluid collections. Vascular/Lymphatic: No pathologically enlarged lymph nodes identified. Nonocclusive DVT seen in the infrahepatic IVC. Reproductive:  No mass or other significant abnormality. Other:  None. Musculoskeletal:  No suspicious bone lesions identified. IMPRESSION: No evidence of abdominal or pelvic malignancy. Nonocclusive DVT in IVC. Moderate to large hiatal hernia. Bilateral lower lobe pulmonary embolism, with small right pleural effusion and right lower lobe atelectasis or infarct, without change compared to recent chest CTA. Electronically Signed   By: Danae Orleans M.D.   On: 09/16/2023 11:36   ECHOCARDIOGRAM COMPLETE Result Date: 09/16/2023    ECHOCARDIOGRAM REPORT   Patient Name:   Jeffrey Potter Date of Exam: 09/16/2023 Medical  Rec #:  [id number]     Height:       70.0 in Accession #:    [id number]    Weight:       214.3 lb Date of Birth:  [birth year]    BSA:          2.149 m Patient Age:    62 years      BP:  163/95 mmHg Patient Gender: M             HR:           88 bpm. Exam Location:  Inpatient Procedure: 2D Echo, Cardiac Doppler, Color Doppler and Intracardiac            Opacification Agent (Both Spectral and Color Flow Doppler were            utilized during procedure). Indications:    I26.02 Pulmonary embolus  History:        Patient has prior history of Echocardiogram examinations, most                 recent 05/06/2022. Signs/Symptoms:Syncope; Risk                 Factors:Hypertension and Dyslipidemia.  Sonographer:    Sheralyn Boatman RDCS Referring Phys: [id number] [facility] GOEL  Sonographer Comments: Technically difficult study due to poor echo windows. Image acquisition challenging due to patient body habitus. IMPRESSIONS  1. Left ventricular ejection fraction, by estimation, is 50 to 55%. Left ventricular ejection fraction by 2D MOD biplane is 54.2 %. The left ventricle has low normal function. The left ventricle has no regional wall motion abnormalities. There is mild left ventricular hypertrophy. Left ventricular diastolic parameters are consistent with Grade I diastolic dysfunction (impaired relaxation). There is the interventricular septum is flattened in systole and diastole, consistent with right ventricular pressure and volume overload.  2. Right ventricular systolic function is severely reduced. The right ventricular size is severely enlarged. Tricuspid regurgitation signal is inadequate for assessing PA pressure, however increased right heart pressure is suspected.  3. Right atrial size was moderately dilated.  4. The mitral valve is normal in structure. No evidence of mitral valve regurgitation. No evidence of mitral stenosis.  5. The aortic valve was not well visualized. Aortic valve regurgitation is not visualized. No  aortic stenosis is present.  6. The inferior vena cava is dilated in size with <50% respiratory variability, suggesting right atrial pressure of 15 mmHg. Comparison(s): Changes from prior study are noted. 05/06/2022: LVEF 70-75%, hyperdynamic RV function. Echo now consistent with severe RV systolic dysfunction and right heart strain, suggestive of hemodynamically significant pulmonary embolism. Conclusion(s)/Recommendation(s): Critical findings reported to Dr. De Hollingshead and acknowledged at 09/16/2023 at 11:33 am. FINDINGS  Left Ventricle: Left ventricular ejection fraction, by estimation, is 50 to 55%. Left ventricular ejection fraction by 2D MOD biplane is 54.2 %. The left ventricle has low normal function. The left ventricle has no regional wall motion abnormalities. Definity contrast agent was given IV to delineate the left ventricular endocardial borders. There is mild left ventricular hypertrophy. The interventricular septum is flattened in systole and diastole, consistent with right ventricular pressure and volume overload. Left ventricular diastolic parameters are consistent with Grade I diastolic dysfunction (impaired relaxation). Indeterminate filling pressures. Right Ventricle: The right ventricular size is severely enlarged. No increase in right ventricular wall thickness. Right ventricular systolic function is severely reduced. Tricuspid regurgitation signal is inadequate for assessing PA pressure. Left Atrium: Left atrial size was normal in size. Right Atrium: Right atrial size was moderately dilated. Pericardium: There is no evidence of pericardial effusion. Mitral Valve: The mitral valve is normal in structure. No evidence of mitral valve regurgitation. No evidence of mitral valve stenosis. Tricuspid Valve: The tricuspid valve is not well visualized. Tricuspid valve regurgitation is not demonstrated. No evidence of tricuspid stenosis. Aortic Valve: The aortic valve was not well visualized. Aortic valve  regurgitation is not visualized. No aortic stenosis is present. Pulmonic Valve: The pulmonic valve was not well visualized. Pulmonic valve regurgitation is not visualized. No evidence of pulmonic stenosis. Aorta: The aortic root and ascending aorta are structurally normal, with no evidence of dilitation. Venous: The inferior vena cava is dilated in size with less than 50% respiratory variability, suggesting right atrial pressure of 15 mmHg. IAS/Shunts: No atrial level shunt detected by color flow Doppler.  LEFT VENTRICLE PLAX 2D                        Biplane EF (MOD) LVIDd:         3.70 cm         LV Biplane EF:   Left LVIDs:         2.70 cm                          ventricular LV PW:         1.10 cm                          ejection LV IVS:        1.10 cm                          fraction by LVOT diam:     2.30 cm                          2D MOD LV SV:         59                               biplane is LV SV Index:   28                               54.2 %. LVOT Area:     4.15 cm                                Diastology                                LV e' medial:    5.22 cm/s LV Volumes (MOD)               LV E/e' medial:  9.2 LV vol d, MOD    102.0 ml      LV e' lateral:   7.40 cm/s A2C:                           LV E/e' lateral: 6.5 LV vol d, MOD    79.2 ml A4C: LV vol s, MOD    45.4 ml A2C: LV vol s, MOD    36.4 ml A4C: LV SV MOD A2C:   56.6 ml LV SV MOD A4C:   79.2 ml LV SV MOD BP:    49.8 ml RIGHT VENTRICLE             IVC RV S prime:     11.30 cm/s  IVC diam: 2.70 cm TAPSE (M-mode):  1.5 cm LEFT ATRIUM             Index        RIGHT ATRIUM           Index LA diam:        3.60 cm 1.67 cm/m   RA Area:     26.00 cm LA Vol (A2C):   30.3 ml 14.10 ml/m  RA Volume:   83.90 ml  39.03 ml/m LA Vol (A4C):   27.0 ml 12.56 ml/m LA Biplane Vol: 29.1 ml 13.54 ml/m  AORTIC VALVE LVOT Vmax:   100.00 cm/s LVOT Vmean:  62.600 cm/s LVOT VTI:    0.143 m  AORTA Ao Root diam: 3.45 cm Ao Asc diam:  3.70 cm MITRAL VALVE MV  Area (PHT): 3.65 cm    SHUNTS MV Decel Time: 208 msec    Systemic VTI:  0.14 m MV E velocity: 47.90 cm/s  Systemic Diam: 2.30 cm MV A velocity: 72.40 cm/s MV E/A ratio:  0.66 Zoila Shutter MD Electronically signed by Zoila Shutter MD Signature Date/Time: 09/16/2023/11:35:25 AM    Final    CT Angio Chest PE W and/or Wo Contrast Result Date: 09/15/2023 CLINICAL DATA:  Shortness of breath.  Hypoxia and fatigue EXAM: CT ANGIOGRAPHY CHEST WITH CONTRAST TECHNIQUE: Multidetector CT imaging of the chest was performed using the standard protocol during bolus administration of intravenous contrast. Multiplanar CT image reconstructions and MIPs were obtained to evaluate the vascular anatomy. RADIATION DOSE REDUCTION: This exam was performed according to the departmental dose-optimization program which includes automated exposure control, adjustment of the mA and/or kV according to patient size and/or use of iterative reconstruction technique. CONTRAST:  75mL OMNIPAQUE IOHEXOL 350 MG/ML SOLN COMPARISON:  X-ray earlier 09/15/2023 and older FINDINGS: Cardiovascular: Bilateral pulmonary emboli identified including some central clot within the main left pulmonary artery. Large areas of thrombus seen in the central lobar vessels on the left as well as segmental vessels and peripheral. There is also central clot within the pulmonary artery extending to the right lower lobe, lobar, segmental. More mild areas thrombi in the right upper lobe. Large clot burden. There is some flattening of the intraventricular septum. Please correlate for other clinical findings of right heart strain. Heart itself is nonenlarged. There is no reflux of contrast into the intrahepatic IVC. Trace pericardial fluid. Thoracic aorta has a normal course and caliber with slight partially calcified plaque. There is a bovine type aortic arch, normal variant. Mediastinum/Nodes: Large hiatal hernia. Slightly patulous esophagus. Preserved thyroid gland. No specific  abnormal lymph node enlargement identified in the axillary regions, hilum or mediastinum. Only a few small less than 1 cm size right hilar nodes are seen and mediastinal nodes, not pathologic by size criteria. Lungs/Pleura: There is a small right pleural effusion with some lung base opacities. Infiltrate is possible. With the pulmonary embolus a developing infarct is also in the differential. Recommend simple follow-up. Please correlate with clinical presentation. No pneumothorax. No left-sided effusion or consolidation. Some breathing motion. Upper Abdomen: Adrenal glands are grossly preserved in the upper abdomen. Lobular contour to the spleen, nonspecific. Musculoskeletal: Scattered degenerative changes seen along the spine. Critical Value/emergent results were called by telephone at the time of interpretation on 09/15/2023 at 6:29 pm to provider [facility] , who verbally acknowledged these results. Review of the MIP images confirms the above findings. IMPRESSION: Pulmonary emboli bilaterally including some large central areas. Extensive distribution of thrombus along the lower lobes greater than left upper lobe  greater than right upper lobe. There is also some flattening of the intraventricular septum. No reflux of contrast into the intrahepatic IVC. Please correlate with any clinical evidence of right heart strain. Small right pleural effusion with adjacent lung opacity. This would have a differential including infiltrate. Infarct would also be in the differential with the level of pulmonary emboli. Aortic Atherosclerosis (ICD10-I70.0). Electronically Signed   By: Karen Kays M.D.   On: 09/15/2023 18:31   DG Chest Port 1 View Result Date: 09/15/2023 CLINICAL DATA:  Short of breath, hypoxia, fatigue EXAM: PORTABLE CHEST 1 VIEW COMPARISON:  02/11/2018 FINDINGS: Single frontal view of the chest demonstrates a stable cardiac silhouette. Patchy opacities are seen at the right lung base, which could reflect  pneumonia or atelectasis. Left chest is clear. No effusion or pneumothorax. IMPRESSION: 1. Patchy right basilar consolidation, which could be hypoventilatory or related to bronchopneumonia. Electronically Signed   By: Sharlet Salina M.D.   On: 09/15/2023 16:57    Labs:  CBC: Recent Labs    09/15/23 1607 09/16/23 0553 09/16/23 2132 09/17/23 0305  WBC 13.9* 11.7* 12.4* 12.4*  HGB 7.7* 7.4* 8.8* 8.8*  HCT 30.2* 28.6* 32.6* 32.8*  PLT 692* 606* 521* 481*    COAGS: Recent Labs    09/15/23 1607 09/16/23 0553  INR 1.2 1.2  APTT  --  31    BMP: Recent Labs    09/15/23 1607 09/16/23 0553 09/17/23 0305  NA 137 138 135  K 4.1 3.8 3.7  CL 102 103 104  CO2 23 22 21*  GLUCOSE 137* 104* 117*  BUN 16 14 9   CALCIUM 8.9 8.6* 8.4*  CREATININE 0.95 0.89 0.68  GFRNONAA >60 >60 >60    LIVER FUNCTION TESTS: Recent Labs    09/15/23 1607 09/17/23 0305  BILITOT 0.7 0.8  AST 12* 13*  ALT 16 12  ALKPHOS 81 73  PROT 8.5* 7.5  ALBUMIN 2.8* 2.6*    Assessment and Plan:  Pulmonary emboli with right heart strain s/p catheter-directed thrombolysis 09/16/23  Patient is doing well this morning and his lysis catheters were pulled at approximately 0800. Right internal jugular site is covered in gauze and is clean/dry. Patient states he feels like he is breathing easier.   Pre-procedure pulmonary artery pressure: 66/31 (44)  Post-therapy measurements:  Right catheter pressure:  37/1 (12) Left catheter pressure: 30/11 (12)  Labs and vitals are stable although he is still requiring supplemental oxygen. Foley catheter remains in place and this can be removed from IR standpoint. Right IJ  dressing can be removed tomorrow and covered with a band-aid.   Please call IR with any questions.   Electronically Signed: Alwyn Ren, AGACNP-BC 09/17/2023, 8:42 AM   I spent a total of 15 Minutes at the the patient's bedside AND on the patient's hospital floor or unit, greater than 50% of which  was counseling/coordinating care for pulmonary emboli

## 2023-09-17 NOTE — Progress Notes (Signed)
 PROGRESS NOTE   Jeffrey Potter  ZOX:[id number] DOB: [birth year] DOA: 09/15/2023 PCP: Garlan Fillers, MD   Date of Service: the patient was seen and examined on 09/18/2023  Brief Narrative:  63 y.o. male with medical history significant for hypertension, hyperlipidemia, gastroesophageal reflux disease presented to [facility] emergency room with a 7-day history of increasing dyspnea on exertion and cough.  Upon evaluation in the emergency department patient was found to be hypoxic requiring initiation of supplemental oxygen via nasal cannula.  CT angiogram of the chest revealed extensive bilateral pulmonary emboli with suspicion for right lower lobe infarction.  Patient additionally was found to have an elevated troponin.  Case was discussed with PCCM and patient was admitted to the hospital service.  Upon admission to the hospital service patient was initiated on a heparin infusion.  PCCM had the opportunity to review the case with interventional radiology and considering the concern for a right lower lobe infarction no thrombolytic thrombus to me intervention was pursued.  Echocardiogram was ordered and revealed severe depression of the right ventricular function.  Case was reviewed in detail with interventional radiology and decision was made to proceed with thrombolysis.  Catheter was placed and infusion was initiated evening of 4/6 and continued until morning of 4/7.  Patient successfully received the infusion without any bleeding complications resulting in symptomatic improvement.  Of note, the patient was also found to have substantial iron deficiency anemia as well as vitamin B12 deficiency.  Patient was initiated on supplementation of both iron and vitamin B12.  Furthermore, patient reports intermittent melena for at least the past year.  Last EGD was performed 2021.  Patient will likely need upper EGD performed in the outpatient setting after discharge.  The presence of substantial  anemia in the setting of unprovoked bilateral pulmonary embolism brought up the concern for possible occult malignancy.  CT imaging throughout the abdomen and pelvis was obtained in addition to a PSA.  Review of older records revealed that patient had multiple adenomatous polyps on colonoscopy in 2020 and was supposed to have a 3-year repeat colonoscopy but was lost to follow-up.  Patient will likely need outpatient colonoscopy after discharge.   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   Thrombolysis infusion complete this morning and catheter has since been removed.   Continuing intravenous heparin.   Anticipate patient may be transition from heparin to lifelong oral anticoagulation morning of 4/8.   PCCM consulted and continuing to follow, their assistance is appreciated.   Supplemental oxygen for associated acute hypoxic respiratory failure Monitoring closely in stepdown as patient is at high risk of rapid clinical decompensation, patient can likely be transferred out of the stepdown unit on 4/8. Concerning etiology, I am concerned for possible undiagnosed malignancy, possibly GI malignancy considering numerous adenomatous polyps identified in 2000 with patient failing to follow-up for the recommended repeat surveillance colonoscopy in 2023.  PSA unremarkable.  SPEP/UPEP pending.  CT imaging of abdomen pelvis reveals no other obvious malignancy.  Acute respiratory failure with hypoxia (HCC) Secondary to submassive pulmonary embolism Supplemental oxygen wean to maintain oxygen saturations of 92 to 96%. Remainder of assessment and plan as above Melena Patient reports intermittent melena over at least the past year  Last EGD in 2021 for food  impaction/stricture last colonoscopy in 2000  revealing multiple adenomatous polyps with patient due for repeat colonoscopy in 2023 that was not performed Patient was seen in gastroenterology clinic on 4/4 for this melena and iron deficiency anemia but upon being evaluated due to hypoxia and low hemoglobin patient was sent to [facility] emergency department for urgent evaluation. Suspect patient may be suffering from intermittent upper gastrointestinal bleeding based on patient's report of black stools  1 unit PRBC transfused 4/6 Patient is unfortunately too ill during this hospitalization to pursue endoscopic workup with gastroenterology and therefore we will recommend patient see close follow-up with them at time of discharge.   rectal exam done by ER doc was guaic negative per report.  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 Loreta Ave Already on heparin which he feels is the appropriate therapy at this time. Iron deficiency anemia Workup including iron panel suggestive of severe iron deficiency anemia  Patient placed on intravenous Venofer infusion followed by daily oral ferrous sulfate.   Vitamin B12 deficiency Workup revealing extremely low vitamin B12 117  vitamin B12 injections x 3 days followed by oral supplementation  Thrombocytosis Likely secondary to severe ongoing iron deficiency anemia Essential hypertension Blood pressure is well above target  Increasing daily amlodipine to 10 mg daily  As needed intravenous hydralazine for markedly elevated blood pressure Mixed hyperlipidemia Continuing home regimen of lipid lowering therapy.  GERD without esophagitis Currently on intravenous PPI in case of bleed with thrombolysis.     Subjective:  Patient reports shortness of  breath is improving.  Patient currently denies chest pain.     Physical Exam:  Vitals:   09/17/23 1400 09/17/23 1500 09/17/23 1600 09/17/23 1722  BP: (!) 169/96 (!) 154/80 (!) 147/72   Pulse: 92 98 97   Resp: (!) 32 (!) 26 (!) 29   Temp:    98.2 F (36.8 C)  TempSrc:    Oral  SpO2: 93% 94% 91%   Weight:      Height:        Constitutional: Awake alert and oriented x3, no associated distress.   Skin: Increased skin pallor noted.  No rashes, no lesions, poor skin turgor noted. Eyes: Pupils are equally reactive to light.  Increased conjunctival pallor without scleral icterus. ENMT: Moist mucous membranes noted.  Posterior pharynx clear of any exudate or lesions.   Respiratory: clear to auscultation bilaterally, no wheezing, no crackles. Normal respiratory effort. No accessory muscle use.  Cardiovascular: Regular rate and rhythm, no murmurs / rubs / gallops. No extremity edema. 2+ pedal pulses. No carotid bruits.  Abdomen: Abdomen is soft and nontender.  No evidence of intra-abdominal masses.  Positive bowel sounds noted in all quadrants.   Musculoskeletal: No joint deformity upper and lower extremities. Good ROM, no contractures. Normal muscle tone.    Data Reviewed:  I have personally reviewed and interpreted labs, imaging.  Significant findings are   CBC: Recent Labs  Lab 09/15/23 1607 09/16/23 0553 09/16/23 2132 09/17/23 0305 09/17/23 0939 09/17/23 1656  WBC 13.9* 11.7* 12.4* 12.4* 13.1* 11.6*  NEUTROABS 12.3*  --   --   --   --   --   HGB 7.7* 7.4* 8.8* 8.8* 8.2* 8.8*  HCT 30.2* 28.6* 32.6* 32.8* 31.1* 34.7*  MCV 65.7* 66.1* 68.6* 67.8* 68.1* 71.0*  PLT 692* 606* 521* 481* 447* 453*  Basic Metabolic Panel: Recent Labs  Lab 09/15/23 1607 09/16/23 0553 09/17/23 0305  NA 137 138 135  K 4.1 3.8 3.7  CL 102 103 104  CO2 23 22 21*  GLUCOSE 137* 104* 117*  BUN 16 14 9   CREATININE 0.95 0.89 0.68  CALCIUM 8.9 8.6* 8.4*  MG  --   --  1.9   GFR: Estimated  Creatinine Clearance: 109.1 mL/min (by C-G formula based on SCr of 0.68 mg/dL). Liver Function Tests: Recent Labs  Lab 09/15/23 1607 09/17/23 0305  AST 12* 13*  ALT 16 12  ALKPHOS 81 73  BILITOT 0.7 0.8  PROT 8.5* 7.5  ALBUMIN 2.8* 2.6*    Coagulation Profile: Recent Labs  Lab 09/15/23 1607 09/16/23 0553  INR 1.2 1.2     Telemetry: Personally reviewed.  Rhythm is normal sinus rhythm with heart rate of 90 bpm.  No dynamic ST segment changes appreciated.   Code Status:  Full code.  Code status decision has been confirmed with: patient Family Communication: Sister is at the bedside and has been updated on plan of care.    Author:  Marinda Elk MD  09/18/2023 12:15 AM

## 2023-09-17 NOTE — Assessment & Plan Note (Signed)
 Currently on intravenous PPI in case of bleed with thrombolysis.

## 2023-09-17 NOTE — Assessment & Plan Note (Signed)
 Likely secondary to severe ongoing iron deficiency anemia

## 2023-09-17 NOTE — Assessment & Plan Note (Signed)
 Workup revealing extremely low vitamin B12 117  vitamin B12 injections x 3 days followed by oral supplementation

## 2023-09-17 NOTE — Assessment & Plan Note (Signed)
 Workup including iron panel suggestive of severe iron deficiency anemia  Patient placed on intravenous Venofer infusion followed by daily oral ferrous sulfate.

## 2023-09-17 NOTE — Progress Notes (Signed)
 PHARMACY - ANTICOAGULATION CONSULT NOTE  Pharmacy Consult for Heparin Indication: pulmonary embolus  No Known Allergies  Patient Measurements: Height: 5\' 10"  (177.8 cm) Weight: 97.2 kg (214 lb 4.6 oz) IBW/kg (Calculated) : 73 HEPARIN DW (KG): 93  Vital Signs: Temp: 98.5 F (36.9 C) (04/06 0855) Temp Source: Oral (04/06 0855) BP: 173/98 (04/06 0830) Pulse Rate: 93 (04/06 0900)  Labs: Recent Labs    09/15/23 1607 09/15/23 1753 09/15/23 2251 09/16/23 0553 09/16/23 0912 09/16/23 2132 09/17/23 0305 09/17/23 0939  HGB 7.7*  --   --  7.4*  --  8.8* 8.8* 8.2*  HCT 30.2*  --   --  28.6*  --  32.6* 32.8* 31.1*  PLT 692*  --   --  606*  --  521* 481* 447*  APTT  --   --   --  31  --   --   --   --   LABPROT 15.1  --   --  15.0  --   --   --   --   INR 1.2  --   --  1.2  --   --   --   --   HEPARINUNFRC  --   --    < >  --    < > 0.22* 0.25* <0.10*  CREATININE 0.95  --   --  0.89  --   --  0.68  --   TROPONINIHS 338* 324*  --   --   --   --   --   --    < > = values in this interval not displayed.    Estimated Creatinine Clearance: 109.1 mL/min (by C-G formula based on SCr of 0.68 mg/dL).   Assessment: 71 yoM presents to ED on 4/4 with worsening SOB x2 weeks.  CTa positive for PE. Pharmacy consulted to dose Heparin.  B Catheter directed lysis x 12  hours - completed @ 0340 am   09/17/2023 B catheter lysis w/ alteplase completed at 0340 am  Lysis catheters pulled at 08 am HL drawn at 0939 am is undetectable at < 0.1 on heparin drip at 2000 units/hr Hgb 8.2, plts 447 fibrinogen 666 No bleeding noted per RN Per RN drip was not held or interrupted  Goal of Therapy:  Heparin 0.3-0.7 units/ml - confirmed increase to standard goal w/ J. Covington, NP Monitor platelets by anticoagulation protocol: Yes   Plan:  Heparin bolus 3000 units IV x 1  Increase heparin rate to 2300 units/hr Q6h heparin level, CBC and fibrinogen x4 - last scheduled for 4/6 at 1730 Daily heparin  level and CBC  Herby Abraham, Pharm.D Use secure chat for questions 09/17/2023 11:01 AM

## 2023-09-17 NOTE — Progress Notes (Signed)
 BLE venous duplex has been completed.   Results can be found under chart review under CV PROC. 09/17/2023 6:21 PM Shantele Reller RVT, RDMS

## 2023-09-18 ENCOUNTER — Other Ambulatory Visit (HOSPITAL_COMMUNITY): Payer: Self-pay

## 2023-09-18 ENCOUNTER — Telehealth: Payer: Self-pay | Admitting: Acute Care

## 2023-09-18 DIAGNOSIS — R0600 Dyspnea, unspecified: Secondary | ICD-10-CM

## 2023-09-18 DIAGNOSIS — I2699 Other pulmonary embolism without acute cor pulmonale: Secondary | ICD-10-CM

## 2023-09-18 LAB — TYPE AND SCREEN
ABO/RH(D): A POS
Antibody Screen: NEGATIVE
Unit division: 0

## 2023-09-18 LAB — CBC
HCT: 32.7 % — ABNORMAL LOW (ref 39.0–52.0)
Hemoglobin: 8.6 g/dL — ABNORMAL LOW (ref 13.0–17.0)
MCH: 18 pg — ABNORMAL LOW (ref 26.0–34.0)
MCHC: 26.3 g/dL — ABNORMAL LOW (ref 30.0–36.0)
MCV: 68.6 fL — ABNORMAL LOW (ref 80.0–100.0)
Platelets: 444 10*3/uL — ABNORMAL HIGH (ref 150–400)
RBC: 4.77 MIL/uL (ref 4.22–5.81)
RDW: 25.2 % — ABNORMAL HIGH (ref 11.5–15.5)
WBC: 11.3 10*3/uL — ABNORMAL HIGH (ref 4.0–10.5)
nRBC: 0.4 % — ABNORMAL HIGH (ref 0.0–0.2)

## 2023-09-18 LAB — COMPREHENSIVE METABOLIC PANEL WITH GFR
ALT: 12 U/L (ref 0–44)
AST: 8 U/L — ABNORMAL LOW (ref 15–41)
Albumin: 2.3 g/dL — ABNORMAL LOW (ref 3.5–5.0)
Alkaline Phosphatase: 68 U/L (ref 38–126)
Anion gap: 11 (ref 5–15)
BUN: 11 mg/dL (ref 8–23)
CO2: 24 mmol/L (ref 22–32)
Calcium: 8.3 mg/dL — ABNORMAL LOW (ref 8.9–10.3)
Chloride: 103 mmol/L (ref 98–111)
Creatinine, Ser: 0.87 mg/dL (ref 0.61–1.24)
GFR, Estimated: >60 mL/min (ref >60)
Glucose, Bld: 105 mg/dL — ABNORMAL HIGH (ref 70–99)
Potassium: 3.7 mmol/L (ref 3.5–5.1)
Sodium: 138 mmol/L (ref 135–145)
Total Bilirubin: 0.6 mg/dL (ref 0.0–1.2)
Total Protein: 7.1 g/dL (ref 6.5–8.1)

## 2023-09-18 LAB — BPAM RBC
Blood Product Expiration Date: 202505012359
ISSUE DATE / TIME: 202504051329
Unit Type and Rh: 6200

## 2023-09-18 LAB — MAGNESIUM: Magnesium: 2.1 mg/dL (ref 1.7–2.4)

## 2023-09-18 LAB — HEPARIN LEVEL (UNFRACTIONATED)
Heparin Unfractionated: 0.36 [IU]/mL (ref 0.30–0.70)
Heparin Unfractionated: 0.23 [IU]/mL — ABNORMAL LOW (ref 0.30–0.70)

## 2023-09-18 MED ORDER — APIXABAN 5 MG PO TABS
5.0000 mg | ORAL_TABLET | Freq: Two times a day (BID) | ORAL | Status: DC
Start: 2023-09-25 — End: 2023-09-19

## 2023-09-18 MED ORDER — APIXABAN 5 MG PO TABS
10.0000 mg | ORAL_TABLET | Freq: Two times a day (BID) | ORAL | Status: DC
  Administered 2023-09-18 – 2023-09-19 (×3): 10 mg via ORAL
  Filled 2023-09-18 (×3): qty 2

## 2023-09-18 MED ORDER — PANTOPRAZOLE SODIUM 40 MG PO TBEC
40.0000 mg | DELAYED_RELEASE_TABLET | Freq: Two times a day (BID) | ORAL | Status: DC
  Administered 2023-09-18 – 2023-09-19 (×3): 40 mg via ORAL
  Filled 2023-09-18 (×3): qty 1

## 2023-09-18 NOTE — Telephone Encounter (Signed)
 PCCM Progress Note   Message sent to office to arrange a hospital follow up for submassive PE with evidence of right heart strain.   Jeffrey Abelson D. Harris, NP-C [facility] Personal contact information can be found on Amion  If no contact or response made please call [phone] 09/18/2023, 1:52 PM

## 2023-09-18 NOTE — Progress Notes (Addendum)
 PHARMACY - ANTICOAGULATION CONSULT NOTE  Pharmacy Consult for Heparin Indication: pulmonary embolus  No Known Allergies  Patient Measurements: Height: 5\' 10"  (177.8 cm) Weight: 97.2 kg (214 lb 4.6 oz) IBW/kg (Calculated) : 73 HEPARIN DW (KG): 93  Vital Signs: Temp: 98.2 F (36.8 C) (04/06 2000) Temp Source: Oral (04/06 2000) BP: 154/88 (04/07 0100) Pulse Rate: 94 (04/07 0100)  Labs: Recent Labs    09/15/23 1607 09/15/23 1753 09/15/23 2251 09/16/23 0553 09/16/23 0912 09/17/23 0305 09/17/23 0939 09/17/23 1656 09/18/23 0119  HGB 7.7*  --   --  7.4*   < > 8.8* 8.2* 8.8* 8.6*  HCT 30.2*  --   --  28.6*   < > 32.8* 31.1* 34.7* 32.7*  PLT 692*  --   --  606*   < > 481* 447* 453* 444*  APTT  --   --   --  31  --   --   --   --   --   LABPROT 15.1  --   --  15.0  --   --   --   --   --   INR 1.2  --   --  1.2  --   --   --   --   --   HEPARINUNFRC  --   --    < >  --    < > 0.25* <0.10* 0.28* 0.36  CREATININE 0.95  --   --  0.89  --  0.68  --   --  0.87  TROPONINIHS 338* 324*  --   --   --   --   --   --   --    < > = values in this interval not displayed.    Estimated Creatinine Clearance: 100.3 mL/min (by C-G formula based on SCr of 0.87 mg/dL).   Assessment: 67 yoM presents to ED on 4/4 with worsening SOB x2 weeks.  CTa positive for PE. Pharmacy consulted to dose Heparin.  B Catheter directed lysis x 12  hours - completed @ 0340 am   09/18/2023 HL 0.36 therapeutic on 2450 units/hr Hgb low but stable, plts elevated No bleeding reported per RN   Goal of Therapy:  Heparin 0.3-0.7 units/ml - confirmed increase to standard goal w/ J. Covington, NP Monitor platelets by anticoagulation protocol: Yes   Plan:  Continue heparin drip at 2450 Confirmatory level in 6 hours Daily heparin level and CBC   Arley Phenix RPh 09/18/2023, 1:50 AM

## 2023-09-18 NOTE — Progress Notes (Signed)
 Progress Note   Patient: Jeffrey Potter AOZ:[id number] DOB: 1960/11/04 DOA: 09/15/2023     3 DOS: the patient was seen and examined on 09/18/2023   Brief hospital course: 62yo with h/o HTN, HLD, and GERD who presented on 4/4 with DOE and cough.  CTA with extensive bilateral pulmonary emboli with suspicion for right lower lobe infarction.  Patient was hypoxic with an elevated troponin.  She initiated on a heparin infusion.  PCCM reviewed the case with interventional radiology and  intervention was pursued.  Echocardiogram revealed severe depression of the right ventricular function.  Catheter thrombolysis performed.  Also with substantial iron deficiency anemia as well as vitamin B12 deficiency, initiated on supplementation of both iron and vitamin B12.  Also with intermittent melena for at least the past year, last EGD was performed 2021; patient will likely need upper EGD/colonoscopy performed in the outpatient setting after discharge.  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 Acute respiratory failure with hypoxia Extensive bilateral pulmonary emboli concerning for submassive pulmonary embolism Elevated troponins of 338 and 324 with severely depressed right ventricular function on echocardiogram prompted need for thrombolysis  Thrombolysis infusion complete this morning and catheter has since been removed Continuing intravenous heparin Transitioned from heparin -> Eliquis 4/7; TOC consulted for medication assistance PCCM consulted and signed off today Supplemental oxygen for associated acute hypoxic respiratory failure, wean to maintain oxygen saturations of 92 to 96% High risk of rapid clinical decompensation but has remained hemodynamically stable, patient can likely be transferred out of the stepdown unit to telemetry today Concern for possible undiagnosed malignancy, possibly GI malignancy considering numerous adenomatous polyps identified in 2000 with patient  failing to follow-up for the recommended repeat surveillance colonoscopy in 2023 - PSA unremarkable, SPEP/UPEP pending, CT imaging of abdomen pelvis reveals no other obvious malignancy.   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 Loreta Ave Already on heparin which he feels is the appropriate therapy at this time   Melena Patient reports intermittent melena over at least the past year  Last EGD in 2021 for food impaction/stricture last colonoscopy in 2000 revealing multiple adenomatous polyps with patient due for repeat colonoscopy in 2023 that was not performed Patient was seen in gastroenterology clinic on 4/4 for this melena and iron deficiency anemia but upon being evaluated due to hypoxia and low hemoglobin patient was sent to [facility] emergency department for urgent evaluation Suspect patient may be suffering from intermittent upper gastrointestinal bleeding based on patient's report of black stools  1 unit PRBC transfused 4/6 Patient is unfortunately too ill during this hospitalization to pursue endoscopic workup with gastroenterology and therefore we will recommend patient see close follow-up with them at time of discharge.   Rectal exam done by ER doc was guaiac negative per report   GERD without esophagitis Currently on intravenous PPI in case of bleed with thrombolysis   Thrombocytosis Likely secondary to severe ongoing iron deficiency anemia   Vitamin B12 deficiency Workup revealing extremely low vitamin B12 117  vitamin B12 injections x 3 days followed by oral supplementation    Iron deficiency anemia Workup including iron panel suggestive of severe iron deficiency anemia  Patient placed on intravenous Venofer infusion followed by  daily oral ferrous sulfate  Mixed hyperlipidemia Continuing home regimen of lipid lowering therapy     Consultants: PCCM IR  Procedures: Catheter-directed thrombolysis 4/5 Echocardiogram 4/5 DVT US 4/6  Antibiotics: None  30 Day Unplanned Readmission Risk Score    Flowsheet Row ED to Hosp-Admission (Current) from 09/15/2023 in [facility]-ICU/STEPDOWN  30 Day Unplanned Readmission Risk Score (%) 12.77 Filed at 09/18/2023 0401       This score is the patient's risk of an unplanned readmission within 30 days of being discharged (0 -100%). The score is based on dignosis, age, lab data, medications, orders, and past utilization.   Low:  0-14.9   Medium: 15-21.9   High: 22-29.9   Extreme: 30 and above           Subjective: Feeling better - while at rest.  On [place] O2 and not SOB - while at rest.  No home O2.   Objective: Vitals:   09/18/23 0600 09/18/23 0700  BP: (!) 173/93 (!) 169/91  Pulse: 94 90  Resp: (!) 28 (!) 28  Temp:    SpO2: 91% 95%    Intake/Output Summary (Last 24 hours) at 09/18/2023 0748 Last data filed at 09/18/2023 0600 Gross per 24 hour  Intake 524.51 ml  Output 3700 ml  Net -3175.49 ml   Filed Weights   09/15/23 1555 09/15/23 2200  Weight: 98.9 kg 97.2 kg    Exam:  General:  Appears calm and comfortable and is in NAD, on [place] O2 Eyes:  EOMI, normal lids, iris ENT:  grossly normal hearing, lips & tongue, mmm Cardiovascular:  RRR. No LE edema.  Respiratory:   CTA bilaterally with no wheezes/rales/rhonchi.  Normal respiratory effort. Abdomen:  soft, NT, ND Skin:  no rash or induration seen on limited exam Musculoskeletal:  grossly normal tone BUE/BLE, good ROM, no bony abnormality Psychiatric:  blunted mood and affect, speech fluent and appropriate, AOx3 Neurologic:  CN 2-12 grossly intact, moves all extremities in coordinated fashion  Data Reviewed: I have reviewed the patient's lab results since admission.  Pertinent labs  for today include:   Stable CMP Albumin 2.3 WBC 11.3 Hgb 8.6 Platelets 444     Family Communication: None present; he declined to have me call his sister  Disposition: Status is: Inpatient Remains inpatient appropriate because: ongoing management     Time spent: 50 minutes  Unresulted Labs (From admission, onward)     Start     Ordered   09/19/23 0500  Heparin level (unfractionated)  Daily,   R     Question:  Specimen collection method  Answer:  Lab=Lab collect   09/17/23 1923   09/18/23 0800  Heparin level (unfractionated)  Once-Timed,   TIMED       Question:  Specimen collection method  Answer:  Lab=Lab collect   09/18/23 0151   09/18/23 0500  CBC  Daily,   R     Question:  Specimen collection method  Answer:  Lab=Lab collect   09/17/23 1115   09/16/23 0838  UPEP/UIFE/Light Chains/TP, 24-Hr Ur  Once,   R        09/16/23 0837   09/16/23 0553  Haptoglobin  Once,   R        09/16/23 0553   09/16/23 0500  Protein electrophoresis, serum  Tomorrow morning,   R        09/15/23 2025             Author: Jonah Blue, MD 09/18/2023 7:48 AM  For  on call review [email or web address].

## 2023-09-18 NOTE — Evaluation (Signed)
 Occupational Therapy Evaluation Patient Details Name: Jeffrey Potter MRN: [id number] DOB: [birth year] Today's Date: 09/18/2023   History of Present Illness   patient is a 63 year old male presented to [facility] [place] emergency room with a 7-day history of increasing dyspnea on exertion and cough. patient was admitted with PE with cor pulmonale. patient underwent thrombolysis 4/6-09/18/23. [name], hyperlipidemia, gastroesophageal reflux disease     Clinical Impressions Patient evaluated by Occupational Therapy with no further acute OT needs identified. All education has been completed and the patient has no further questions. Patient plans to d/c home to mothers house with support as needed. Patient was able to complete ADLs with MI on this date.  See below for any follow-up Occupational Therapy or equipment needs. OT is signing off. Thank you for this referral.      If plan is discharge home, recommend the following:   Assistance with cooking/housework;Assist for transportation     Functional Status Assessment   Patient has not had a recent decline in their functional status     Equipment Recommendations   None recommended by OT      Precautions/Restrictions   Precautions Precaution/Restrictions Comments: monitor O2 Restrictions Weight Bearing Restrictions Per Provider Order: No     Mobility Bed Mobility Overal bed mobility: Modified Independent                           Balance Overall balance assessment: Independent               ADL either performed or assessed with clinical judgement   ADL Overall ADL's : Modified independent         General ADL Comments: patient is able to complete ADL tasks at this time, LB Dressing, transfers, bed mobility, functional mobility and simulated standing reaching. patietn was MI on RA with no supplemetnal O2 during tasks. at end of session, when returned to bed patient was noted to have O2 drop  to 84% on RA. patient was returned to 95% on 2L/min after two mins of lying down.     Vision Baseline Vision/History: 1 Wears glasses              Pertinent Vitals/Pain Pain Assessment Pain Assessment: No/denies pain     Extremity/Trunk Assessment Upper Extremity Assessment Upper Extremity Assessment: Overall WFL for tasks assessed   Lower Extremity Assessment Lower Extremity Assessment: Defer to PT evaluation   Cervical / Trunk Assessment Cervical / Trunk Assessment: Normal   Communication Communication Communication: No apparent difficulties   Cognition Arousal: Alert Behavior During Therapy: WFL for tasks assessed/performed Cognition: No apparent impairments             OT - Cognition Comments: patient was a man of few words, sister was present during session with various questions on recomandations.                                    Home Living Family/patient expects to be discharged to:: Private residence Living Arrangements: Alone Available Help at Discharge: Family;Available PRN/intermittently Type of Home: House Home Access: Stairs to enter Entrance Stairs-Number of Steps: 2   Home Layout: Two level Alternate Level Stairs-Number of Steps: flight Alternate Level Stairs-Rails: Can reach both;Left;Right           Home Equipment: None   Additional Comments: will DC to mother's home; info above is  for her home.she has a chair lift. MOther is 38 y.o. has a PCA.      Prior Functioning/Environment Prior Level of Function : Independent/Modified Independent             Mobility Comments: walks without AD              OT Goals(Current goals can be found in the care plan section)   Acute Rehab OT Goals OT Goal Formulation: All assessment and education complete, DC therapy   OT Frequency:       Co-evaluation PT/OT/SLP Co-Evaluation/Treatment: Yes Reason for Co-Treatment: For patient/therapist safety PT goals addressed  during session: Mobility/safety with mobility;Balance OT goals addressed during session: ADL's and self-care      AM-PAC OT "6 Clicks" Daily Activity     Outcome Measure Help from another person eating meals?: None Help from another person taking care of personal grooming?: None Help from another person toileting, which includes using toliet, bedpan, or urinal?: None Help from another person bathing (including washing, rinsing, drying)?: None Help from another person to put on and taking off regular upper body clothing?: None Help from another person to put on and taking off regular lower body clothing?: None 6 Click Score: 24   End of Session Equipment Utilized During Treatment: Oxygen Nurse Communication: Mobility status  Activity Tolerance: Patient tolerated treatment well Patient left: in bed;with call bell/phone within reach  OT Visit Diagnosis: Unsteadiness on feet (R26.81)                Time: 1610-9604 OT Time Calculation (min): 19 min Charges:  OT General Charges $OT Visit: 1 Visit OT Evaluation $OT Eval Low Complexity: 1 Low  Jeffrey Potter OTR/L, MS Acute Rehabilitation Department Office# [phone]   Jeffrey Potter 09/18/2023, 4:07 PM

## 2023-09-18 NOTE — Progress Notes (Signed)
 NAME:  Jeffrey Potter, MRN:  [id number], DOB:  Oct 28, 1960, LOS: 3 ADMISSION DATE:  09/15/2023, CONSULTATION DATE:  09/15/23 REFERRING MD:  Dr Rodena Medin, CHIEF COMPLAINT:  Pulmonary embolism   History of Present Illness:  Patient came in with worsening shortness of breath Has had shortness of breath for about 2 weeks, some cough, chest discomfort on the right side Symptoms did settle down after about 4 to 5 days. Last night 4 /3/25 had chest pain, lightheaded, shortness of breath. Not lightheaded today When seen for a visit with GI for evaluation for anemia, noted to have iron deficiency anemia by primary doctor sometime in December   Evaluation in the ED-noted to have PE with right heart strain   Denies any history of heart disease, no history of lung disease, never smoker No recent surgery No recent travel   Significant history of GI interventions, S rings, food impactions - While in the GI office today was noted to be hypotensive, low oxygen sats of 88%  Pertinent  Medical History   Past Medical History:  Diagnosis Date   GERD (gastroesophageal reflux disease)    High cholesterol    High cholesterol    Hypertension     Significant Hospital Events: Including procedures, antibiotic start and stop dates in addition to other pertinent events   09/15/2023-CT chest-pulmonary embolism right heart strain, interventricular septum flattening.  Pulmonary infarct 09/15/2023-echocardiogram-result pending 4/5 catheter directed lysis 4/7 Remains on heparin with subtherapeutic levels   Interim History / Subjective:  Seen lying in bed with no acute complaints  Sister at bedside and updated   Objective   Blood pressure (!) 165/79, pulse 97, temperature 97.7 F (36.5 C), temperature source Oral, resp. rate (!) 25, height 5\' 10"  (1.778 m), weight 97.2 kg, SpO2 96%.        Intake/Output Summary (Last 24 hours) at 09/18/2023 1102 Last data filed at 09/18/2023 1032 Gross per 24 hour  Intake 960.64  ml  Output 3150 ml  Net -2189.36 ml   Filed Weights   09/15/23 1555 09/15/23 2200  Weight: 98.9 kg 97.2 kg    Examination: General: Acute on chronically ill appearing middle aged male lying in bed, in NAD HEENT: [place]/AT, MM pink/moist, PERRL,  Neuro: Alert and oriented x3, non-focal  CV: s1s2 regular rate and rhythm, no murmur, rubs, or gallops,  PULM:  Clear to auscultation, slightly shallow respirations, no increased work of breathing on 2L [place] GI: soft, bowel sounds active in all 4 quadrants, non-tender, non-distended, tolerating oral diet  Extremities: warm/dry, no edema  Skin: no rashes or lesions  Resolved Hospital Problem list     Assessment & Plan:   Submassive pulmonary embolism -Echocardiogram 4/7 with EF of 50 to 55%, no WMA, and grade 1 diastolic dysfunction with interventricular septal flattening consistent with RV pressure overload Pulmonary infect IVC clot - Catheter directed lysis 09/16/2023 P: Remain on Heparin drip Stable for transition to DOAC, will likley need financial assistance  Will arrange for outpatient follow up with practice to follow up ECHO   PCCM will sign off. Thank you for the opportunity to participate in this patient's care. Please contact if we can be of further assistance.   Best Practice (right click and "Reselect all SmartList Selections" daily)   Diet/type: Regular consistency (see orders) DVT prophylaxis systemic heparin Pressure ulcer(s): N/A GI prophylaxis: PPI Lines: N/A Foley:  N/A Code Status:  full code Last date of multidisciplinary goals of care discussion: Continue to update patient  and family daily   Audric Venn D. Harris, NP-C [facility] Personal contact information can be found on Amion  If no contact or response made please call [phone] 09/18/2023, 1:48 PM

## 2023-09-18 NOTE — Progress Notes (Signed)
 Report called. Patient stable at time of transfer.

## 2023-09-18 NOTE — Discharge Instructions (Signed)
 Information on my medicine - ELIQUIS (apixaban)  This medication education was reviewed with me or my healthcare representative as part of my discharge preparation.   Why was Eliquis prescribed for you? Eliquis was prescribed to treat blood clots that may have been found in the veins of your legs (deep vein thrombosis) or in your lungs (pulmonary embolism) and to reduce the risk of them occurring again.  What do You need to know about Eliquis ? The starting dose is 10 mg (two 5 mg tablets) taken TWICE daily for the FIRST SEVEN (7) DAYS, then on Monday 09/25/23 or after all 10 mg doses have been taken  the dose is reduced to ONE 5 mg tablet taken TWICE daily.  Eliquis may be taken with or without food.   Try to take the dose about the same time in the morning and in the evening. If you have difficulty swallowing the tablet whole please discuss with your pharmacist how to take the medication safely.  Take Eliquis exactly as prescribed and DO NOT stop taking Eliquis without talking to the doctor who prescribed the medication.  Stopping may increase your risk of developing a new blood clot.  Refill your prescription before you run out.  After discharge, you should have regular check-up appointments with your healthcare provider that is prescribing your Eliquis.    What do you do if you miss a dose? If a dose of ELIQUIS is not taken at the scheduled time, take it as soon as possible on the same day and twice-daily administration should be resumed. The dose should not be doubled to make up for a missed dose.  Important Safety Information A possible side effect of Eliquis is bleeding. You should call your healthcare provider right away if you experience any of the following: Bleeding from an injury or your nose that does not stop. Unusual colored urine (red or dark brown) or unusual colored stools (red or black). Unusual bruising for unknown reasons. A serious fall or if you hit your head  (even if there is no bleeding).  Some medicines may interact with Eliquis and might increase your risk of bleeding or clotting while on Eliquis. To help avoid this, consult your healthcare provider or pharmacist prior to using any new prescription or non-prescription medications, including herbals, vitamins, non-steroidal anti-inflammatory drugs (NSAIDs) and supplements.  This website has more information on Eliquis (apixaban): http://www.eliquis.com/eliquis/home

## 2023-09-18 NOTE — Progress Notes (Signed)
 Patient assistance paperwork completed, faxed to [employer] patient assistance [employer], and placed in chart. Waiting on fax confirmation.  PCCM will sign off. OP follow up has been requested. Please call with questions.  Steffanie Dunn, DO 09/18/23 5:08 PM [facility]  For contact information, see Amion. If no response to pager, please call PCCM consult pager. After hours, 7PM- 7AM, please call Elink.

## 2023-09-18 NOTE — Evaluation (Signed)
 Physical Therapy Evaluation Patient Details Name: Jeffrey Potter MRN: [id number] DOB: December 14, 1960 Today's Date: 09/18/2023  History of Present Illness  63 y.o. male presented to [facility] emergency room with a 7-day history of increasing dyspnea on exertion and cough. Dx of PE with cor pulmonale. s/p thrombolysis 4/6-09/18/23. Pt  with medical history significant for hypertension, hyperlipidemia, gastroesophageal reflux disease  Clinical Impression  Pt is mobilizing well, she ambulated 175' without an assistive device, no loss of balance. SpO2 92% on room air at rest, 90-94% on room air walking, then dropped to 84% on room air during seated recovery.  Pt is independent with mobility, no further acute PT indicated, will sign off. Mobility team to follow.          If plan is discharge home, recommend the following: Assistance with cooking/housework   Can travel by private vehicle        Equipment Recommendations None recommended by PT  Recommendations for Other Services       Functional Status Assessment Patient has had a recent decline in their functional status and demonstrates the ability to make significant improvements in function in a reasonable and predictable amount of time.     Precautions / Restrictions Precautions Precautions: Other (comment) Precaution/Restrictions Comments: monitor O2 Restrictions Weight Bearing Restrictions Per Provider Order: No      Mobility  Bed Mobility Overal bed mobility: Modified Independent             General bed mobility comments: HOB up, used rail    Transfers Overall transfer level: Independent Equipment used: None                    Ambulation/Gait Ambulation/Gait assistance: Independent Gait Distance (Feet): 175 Feet Assistive device: None Gait Pattern/deviations: WFL(Within Functional Limits) Gait velocity: decr     General Gait Details: steady, no loss of balance, SpO2 92% on room air at rest,  90-94% on room air walking, then dropped to 84% on room air during seated recovery  Stairs            Wheelchair Mobility     Tilt Bed    Modified Rankin (Stroke Patients Only)       Balance Overall balance assessment: Independent                                           Pertinent Vitals/Pain Pain Assessment Pain Assessment: No/denies pain    Home Living Family/patient expects to be discharged to:: Private residence Living Arrangements: Alone Available Help at Discharge: Family;Available PRN/intermittently   Home Access: Stairs to enter   Entrance Stairs-Number of Steps: 2 Alternate Level Stairs-Number of Steps: flight Home Layout: Two level Home Equipment: None Additional Comments: will DC to mother's home; info above is for her home.she has a chair lift. MOther is 33 y.o. has a PCA.    Prior Function Prior Level of Function : Independent/Modified Independent             Mobility Comments: walks without AD       Extremity/Trunk Assessment   Upper Extremity Assessment Upper Extremity Assessment: Defer to OT evaluation    Lower Extremity Assessment Lower Extremity Assessment: Overall WFL for tasks assessed    Cervical / Trunk Assessment Cervical / Trunk Assessment: Normal  Communication   Communication Communication: No apparent difficulties    Cognition Arousal: Alert  Behavior During Therapy: WFL for tasks assessed/performed   PT - Cognitive impairments: No apparent impairments                         Following commands: Intact       Cueing       General Comments      Exercises     Assessment/Plan    PT Assessment Patient does not need any further PT services  PT Problem List         PT Treatment Interventions      PT Goals (Current goals can be found in the Care Plan section)  Acute Rehab PT Goals PT Goal Formulation: All assessment and education complete, DC therapy    Frequency        Co-evaluation PT/OT/SLP Co-Evaluation/Treatment: Yes Reason for Co-Treatment: For patient/therapist safety PT goals addressed during session: Mobility/safety with mobility;Balance         AM-PAC PT "6 Clicks" Mobility  Outcome Measure Help needed turning from your back to your side while in a flat bed without using bedrails?: None Help needed moving from lying on your back to sitting on the side of a flat bed without using bedrails?: None Help needed moving to and from a bed to a chair (including a wheelchair)?: None Help needed standing up from a chair using your arms (e.g., wheelchair or bedside chair)?: None Help needed to walk in hospital room?: None Help needed climbing 3-5 steps with a railing? : None 6 Click Score: 24    End of Session   Activity Tolerance: Patient tolerated treatment well Patient left: in bed;with bed alarm set;with call bell/phone within reach;with family/visitor present Nurse Communication: Mobility status      Time: 8119-1478 PT Time Calculation (min) (ACUTE ONLY): 20 min   Charges:   PT Evaluation $PT Eval Low Complexity: 1 Low   PT General Charges $$ ACUTE PT VISIT: 1 Visit         Tamala Ser PT 09/18/2023  Acute Rehabilitation Services  Office [phone]

## 2023-09-18 NOTE — Plan of Care (Signed)

## 2023-09-18 NOTE — Progress Notes (Addendum)
 PHARMACY - ANTICOAGULATION CONSULT NOTE  Pharmacy Consult for Heparin Indication: pulmonary embolus  No Known Allergies  Patient Measurements: Height: 5\' 10"  (177.8 cm) Weight: 97.2 kg (214 lb 4.6 oz) IBW/kg (Calculated) : 73 HEPARIN DW (KG): 93  Vital Signs: Temp: 97.7 F (36.5 C) (04/07 0755) Temp Source: Oral (04/07 0755) BP: 169/91 (04/07 0700) Pulse Rate: 90 (04/07 0700)  Labs: Recent Labs    09/15/23 1607 09/15/23 1753 09/15/23 2251 09/16/23 0553 09/16/23 0912 09/17/23 0305 09/17/23 0939 09/17/23 1656 09/18/23 0119 09/18/23 0809  HGB 7.7*  --   --  7.4*   < > 8.8* 8.2* 8.8* 8.6*  --   HCT 30.2*  --   --  28.6*   < > 32.8* 31.1* 34.7* 32.7*  --   PLT 692*  --   --  606*   < > 481* 447* 453* 444*  --   APTT  --   --   --  31  --   --   --   --   --   --   LABPROT 15.1  --   --  15.0  --   --   --   --   --   --   INR 1.2  --   --  1.2  --   --   --   --   --   --   HEPARINUNFRC  --   --    < >  --    < > 0.25* <0.10* 0.28* 0.36 0.23*  CREATININE 0.95  --   --  0.89  --  0.68  --   --  0.87  --   TROPONINIHS 338* 324*  --   --   --   --   --   --   --   --    < > = values in this interval not displayed.    Estimated Creatinine Clearance: 100.3 mL/min (by C-G formula based on SCr of 0.87 mg/dL).   Assessment: 25 yoM presents to ED on 4/4 with worsening SOB x2 weeks.  CTa positive for B PE. Pharmacy consulted to dose Heparin.  4/5: B Catheter directed lysis x 12  hours - completed @ 0340 am on 4/6 Lysis catheters pulled 08 am 4/6  09/18/2023 Confirmatory heparin level down to  0.23 - sub-therapeutic on 2450 units/hr Hg 8.6> stable, PLT remains OK No bleeding reported per RN No interruptions in drip per RN  Goal of Therapy:  Heparin 0.3-0.7 units/ml  Monitor platelets by anticoagulation protocol: Yes   Plan:  increase heparin drip to 2600 units/hr heparin level 6 hours after rate increase Daily heparin level and CBC F/u for transition to oral  agent  Herby Abraham, Pharm.D Use secure chat for questions 09/18/2023 9:14 AM  Addendum: To transition to apixaban  Plan: Dc heparin drip Apixaban 10 mg po bid x 7 days followed by apixaban 5 mg po bid Will provide education & 30 day free card prior to discharge DC heparin levels & CBCs Rx patient advocate team consulted for co-pay  Herby Abraham, Pharm.D Use secure chat for questions 09/18/2023 12:35 PM

## 2023-09-19 ENCOUNTER — Other Ambulatory Visit (HOSPITAL_COMMUNITY): Payer: Self-pay

## 2023-09-19 DIAGNOSIS — I2609 Other pulmonary embolism with acute cor pulmonale: Principal | ICD-10-CM

## 2023-09-19 LAB — BASIC METABOLIC PANEL WITH GFR
Anion gap: 9 (ref 5–15)
BUN: 15 mg/dL (ref 8–23)
CO2: 24 mmol/L (ref 22–32)
Calcium: 8.5 mg/dL — ABNORMAL LOW (ref 8.9–10.3)
Chloride: 105 mmol/L (ref 98–111)
Creatinine, Ser: 0.96 mg/dL (ref 0.61–1.24)
GFR, Estimated: >60 mL/min (ref >60)
Glucose, Bld: 156 mg/dL — ABNORMAL HIGH (ref 70–99)
Potassium: 3.6 mmol/L (ref 3.5–5.1)
Sodium: 138 mmol/L (ref 135–145)

## 2023-09-19 LAB — PROTEIN ELECTROPHORESIS, SERUM
A/G Ratio: 0.6 — ABNORMAL LOW (ref 0.7–1.7)
Albumin ELP: 2.6 g/dL — ABNORMAL LOW (ref 2.9–4.4)
Alpha-1-Globulin: 0.4 g/dL (ref 0.0–0.4)
Alpha-2-Globulin: 1.1 g/dL — ABNORMAL HIGH (ref 0.4–1.0)
Beta Globulin: 1.3 g/dL (ref 0.7–1.3)
Gamma Globulin: 1.5 g/dL (ref 0.4–1.8)
Globulin, Total: 4.2 g/dL — ABNORMAL HIGH (ref 2.2–3.9)
Total Protein ELP: 6.8 g/dL (ref 6.0–8.5)

## 2023-09-19 LAB — CBC WITH DIFFERENTIAL/PLATELET
Abs Immature Granulocytes: 0.03 10*3/uL (ref 0.00–0.07)
Basophils Absolute: 0 10*3/uL (ref 0.0–0.1)
Basophils Relative: 1 %
Eosinophils Absolute: 0.1 10*3/uL (ref 0.0–0.5)
Eosinophils Relative: 2 %
HCT: 35.1 % — ABNORMAL LOW (ref 39.0–52.0)
Hemoglobin: 8.9 g/dL — ABNORMAL LOW (ref 13.0–17.0)
Immature Granulocytes: 0 %
Lymphocytes Relative: 16 %
Lymphs Abs: 1.2 10*3/uL (ref 0.7–4.0)
MCH: 17.9 pg — ABNORMAL LOW (ref 26.0–34.0)
MCHC: 25.4 g/dL — ABNORMAL LOW (ref 30.0–36.0)
MCV: 70.6 fL — ABNORMAL LOW (ref 80.0–100.0)
Monocytes Absolute: 0.6 10*3/uL (ref 0.1–1.0)
Monocytes Relative: 8 %
Neutro Abs: 5.4 10*3/uL (ref 1.7–7.7)
Neutrophils Relative %: 73 %
Platelets: 397 10*3/uL (ref 150–400)
RBC: 4.97 MIL/uL (ref 4.22–5.81)
RDW: 26.2 % — ABNORMAL HIGH (ref 11.5–15.5)
Smear Review: NORMAL
WBC: 7.4 10*3/uL (ref 4.0–10.5)
nRBC: 0.3 % — ABNORMAL HIGH (ref 0.0–0.2)

## 2023-09-19 MED ORDER — AMLODIPINE BESYLATE 10 MG PO TABS
10.0000 mg | ORAL_TABLET | Freq: Every morning | ORAL | 0 refills | Status: DC
  Filled 2023-09-19: qty 30, 30d supply, fill #0

## 2023-09-19 MED ORDER — APIXABAN 5 MG PO TABS
ORAL_TABLET | ORAL | 0 refills | Status: DC
  Filled 2023-09-19: qty 60, 24d supply, fill #0

## 2023-09-19 MED ORDER — FERROUS SULFATE 325 (65 FE) MG PO TABS
325.0000 mg | ORAL_TABLET | Freq: Every day | ORAL | 0 refills | Status: DC
  Filled 2023-09-19: qty 30, 30d supply, fill #0

## 2023-09-19 MED ORDER — CYANOCOBALAMIN 1000 MCG PO TABS
1000.0000 ug | ORAL_TABLET | Freq: Every day | ORAL | 0 refills | Status: AC
  Filled 2023-09-19: qty 30, 30d supply, fill #0

## 2023-09-19 NOTE — Progress Notes (Signed)
 SATURATION QUALIFICATIONS: (This note is used to comply with regulatory documentation for home oxygen)  Patient Saturations on Room Air at Rest = 90%  Patient Saturations on Room Air while Ambulating = 85%  Patient Saturations on 2 Liters of oxygen while Ambulating = 93%  Please briefly explain why patient needs home oxygen: Patients oxygen saturations drop to 85% on room air with ambulation.

## 2023-09-19 NOTE — TOC Transition Note (Signed)
 Transition of Care [facility]) - Discharge Note   Patient Details  Name: Jeffrey Potter MRN: [id number] Date of Birth: Aug 31, 1960  [facility]) CM/SW Contact:  Otelia Santee, LCSW Phone Number: 09/19/2023, 2:38 PM   Clinical Narrative:    Pt with new O2 requirement. 2L of continuous O2 is $160 per month. Spoke with pt and confirmed he is able to afford this. Pt has no preference for DME agency. O2 has been ordered through [facility] and travel tank will be delivered to pt's room prior to discharge.  Reviewed cost of medications with pt. Pt reports he is able to self-pay for his medications.  No further TOC needs identified.    Final next level of care: Home/Self Care Barriers to Discharge: Barriers Resolved   Patient Goals and CMS Choice Patient states their goals for this hospitalization and ongoing recovery are:: home          Discharge Placement                       Discharge Plan and Services Additional resources added to the After Visit Summary for     Discharge Planning Services: CM Consult            DME Arranged: Oxygen DME Agency: [employer] Date DME Agency Contacted: 09/19/23 Time DME Agency Contacted: [phone] Representative spoke with at DME Agency: Mitch            Social Drivers of Health (SDOH) Interventions SDOH Screenings   Food Insecurity: No Food Insecurity (09/16/2023)  Housing: Low Risk  (09/16/2023)  Transportation Needs: No Transportation Needs (09/16/2023)  Utilities: Not At Risk (09/16/2023)  Tobacco Use: Low Risk  (09/15/2023)     Readmission Risk Interventions    09/19/2023    2:37 PM 09/19/2023    1:43 PM  Readmission Risk Prevention Plan  Post Dischage Appt Complete Complete  Medication Screening Complete   Transportation Screening Complete

## 2023-09-19 NOTE — Plan of Care (Signed)

## 2023-09-19 NOTE — Progress Notes (Signed)
 Mobility Specialist - Progress Note   09/19/23 0950  Oxygen Therapy  SpO2 (!) 85 %  O2 Device Room Air  Patient Activity (if Appropriate) Ambulating  Mobility  Activity Ambulated independently in hallway  Level of Assistance Independent  Assistive Device None  Distance Ambulated (ft) 250 ft  Activity Response Tolerated well  Mobility Referral Yes  Mobility visit 1 Mobility  Mobility Specialist Start Time (ACUTE ONLY) [id number]  Mobility Specialist Stop Time (ACUTE ONLY) 0949  Mobility Specialist Time Calculation (min) (ACUTE ONLY) 11 min   Nurse requested Mobility Specialist to perform oxygen saturation test with pt which includes removing pt from oxygen both at rest and while ambulating.  Below are the results from that testing.     Patient Saturations on Room Air at Rest = spO2 90%  Patient Saturations on Room Air while Ambulating = sp02 85%.    At end of testing pt left in room on 2  Liters of oxygen.  Reported results to nurse. Pt received in bed and agreeable to mobility. SOB throughout session. RN made aware. Athena placed back on at EOS bringing SpO2 to 93%. No complaints during session. Pt to bed after session with all needs met.    Pre-mobility: 90% SpO2 (RA) During mobility: 85% SpO2 (RA) Post-mobility: 93% SPO2 (2L [place])  Chief Technology Officer

## 2023-09-19 NOTE — Discharge Summary (Signed)
 Physician Discharge Summary   Patient: Jeffrey Potter MRN: [id number] DOB: January 30, 1961  Admit date:     09/15/2023  Discharge date: 09/19/23  Discharge Physician: Jeffrey Potter   PCP: Jeffrey Fillers, MD   Recommendations at discharge:   You are being discharged with home oxygen Take Eliquis twice daily indefinitely; do not miss doses Increase amlodipine to 10 mg daily Take iron and B12 supplementation daily Follow up with GI for evaluation of melena soon Follow up with Dr. Jarold Motto in 1-2 weeks  Discharge Diagnoses: Principal Problem:   Acute pulmonary embolism with acute cor pulmonale (HCC) Active Problems:   Essential hypertension   Mixed hyperlipidemia   Iron deficiency anemia   Vitamin B12 deficiency   Acute respiratory failure with hypoxia (HCC)   Thrombocytosis   GERD without esophagitis   Melena   Elevated troponin level not due myocardial infarction   IVC thrombosis (HCC)   Dyspnea   Pulmonary embolism [facility])   Hospital Course: 62yo with h/o HTN, HLD, and GERD who presented on 4/4 with DOE and cough.  CTA with extensive bilateral pulmonary emboli with suspicion for right lower lobe infarction.  Patient was hypoxic with an elevated troponin.  She initiated on a heparin infusion.  PCCM reviewed the case with interventional radiology and  intervention was pursued.  Echocardiogram revealed severe depression of the right ventricular function.  Catheter thrombolysis performed.  Also with substantial iron deficiency anemia as well as vitamin B12 deficiency, initiated on supplementation of both iron and vitamin B12.  Also with intermittent melena for at least the past year, last EGD was performed 2021; patient will likely need upper EGD/colonoscopy performed in the outpatient setting after discharge.  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 Acute respiratory failure with hypoxia Extensive bilateral pulmonary emboli concerning for  submassive pulmonary embolism Elevated troponins of 338 and 324 with severely depressed right ventricular function on echocardiogram prompted need for thrombolysis  Thrombolysis infusion complete this morning and catheter has since been removed DVT US negative Transitioned from heparin -> Eliquis 4/7; TOC consulted for medication assistance PCCM consulted and signed off  Supplemental oxygen for associated acute hypoxic respiratory failure, wean to maintain oxygen saturations of 92 to 96% High risk of rapid clinical decompensation but has remained hemodynamically stable, patient can likely be transferred out of the stepdown unit to telemetry today Concern for possible undiagnosed malignancy, possibly GI malignancy considering numerous adenomatous polyps identified in 2000 with patient failing to follow-up for the recommended repeat surveillance colonoscopy in 2023 - PSA unremarkable, SPEP/UPEP pending, CT imaging of abdomen pelvis reveals no other obvious malignancy.   Essential hypertension Blood pressure is well above target  Increased daily amlodipine to 10 mg daily  Current BP 139/80, improved   IVC thrombosis  Identified on CT of abdomen and pelvis Reviewed with Dr. Loreta Ave Already on heparin -> Eliquis, which he feels is the appropriate therapy at this time   Melena Patient reports intermittent melena over at least the past year  Last EGD in 2021 for food impaction/stricture last colonoscopy in 2000 revealing multiple adenomatous polyps with patient due for repeat colonoscopy in 2023 that was not performed Patient was seen in gastroenterology clinic on 4/4 for this melena and iron deficiency anemia but upon being evaluated due to hypoxia and low hemoglobin patient was sent to [facility] emergency department for urgent evaluation Suspect patient may be suffering from intermittent upper gastrointestinal bleeding based on patient's report of  black stools  1 unit PRBC transfused  4/6 Patient is unfortunately too ill during this hospitalization to pursue endoscopic workup with gastroenterology and therefore we will recommend patient see close follow-up with them at time of discharge.   Rectal exam done by ER doc was guaiac negative per report   GERD without esophagitis PPI   Thrombocytosis Likely secondary to severe ongoing iron deficiency anemia   Vitamin B12 deficiency Workup revealing extremely low vitamin B12 117  vitamin B12 injections x 3 days followed by oral supplementation    Iron deficiency anemia Workup including iron panel suggestive of severe iron deficiency anemia  Patient placed on intravenous Venofer infusion followed by daily oral ferrous sulfate   Mixed hyperlipidemia Continuing home regimen of lipid lowering therapy  Class 1 Obesity Body mass index is 30.75 kg/m.Marland Kitchen  Weight loss should be encouraged Outpatient PCP/bariatric medicine f/u encouraged Significantly low or high BMI is associated with higher medical risk including morbidity and mortality        Consultants: PCCM IR   Procedures: Catheter-directed thrombolysis 4/5 Echocardiogram 4/5 DVT US 4/6   Antibiotics: None  Pain control - [place] Controlled Substance Reporting System database was reviewed. and patient was instructed, not to drive, operate heavy machinery, perform activities at heights, swimming or participation in water activities or provide baby-sitting services while on Pain, Sleep and Anxiety Medications; until their outpatient Physician has advised to do so again. Also recommended to not to take more than prescribed Pain, Sleep and Anxiety Medications.   Disposition: Home Diet recommendation:  Regular diet DISCHARGE MEDICATION: Allergies as of 09/19/2023   No Known Allergies      Medication List     STOP taking these medications    amoxicillin 500 MG capsule Commonly known as: AMOXIL   iron polysaccharides 150 MG capsule Commonly known as:  NIFEREX       TAKE these medications    amLODipine 10 MG tablet Commonly known as: NORVASC Take 1 tablet (10 mg total) by mouth in the morning. Start taking on: September 20, 2023 What changed:  medication strength how much to take   apixaban 5 MG Tabs tablet Commonly known as: ELIQUIS Take 2 tablets (10 mg total) by mouth 2 (two) times daily for 6 days, THEN 1 tablet (5 mg total) 2 (two) times daily. Start taking on: September 19, 2023   cyanocobalamin 1000 MCG tablet Take 1 tablet (1,000 mcg total) by mouth daily. Start taking on: September 20, 2023   ferrous sulfate 325 (65 FE) MG tablet Take 1 tablet (325 mg total) by mouth daily before breakfast. Start taking on: September 20, 2023   Fish Oil 1000 MG Caps Take 1 capsule by mouth daily.   lovastatin 20 MG tablet Commonly known as: MEVACOR Take 40 mg by mouth in the morning.   omeprazole 20 MG tablet Commonly known as: PRILOSEC OTC Take 20 mg by mouth in the morning.   Potassium Chloride ER 20 MEQ Tbcr Take 1 tablet by mouth daily.               Durable Medical Equipment  (From admission, onward)           Start     Ordered   09/19/23 1255  DME Oxygen  Once       Question Answer Comment  Length of Need 6 Months   Mode or (Route) Nasal cannula   Frequency Continuous (stationary and portable oxygen unit needed)   Oxygen conserving device  No   Oxygen delivery system Gas      09/19/23 1254            Discharge Exam:   Subjective: Feeling good and wants to go home.  Desat to 85% with ambulation, prefers to go home with oxygen today rather than staying longer and hoping to wean off.  No new concerns.   Objective: Vitals:   09/19/23 0950 09/19/23 1139  BP:  139/80  Pulse:  95  Resp:    Temp:  99.3 F (37.4 C)  SpO2: (!) 85% 98%    Intake/Output Summary (Last 24 hours) at 09/19/2023 1256 Last data filed at 09/18/2023 1531 Gross per 24 hour  Intake 227.95 ml  Output --  Net 227.95 ml   Filed Weights    09/15/23 1555 09/15/23 2200  Weight: 98.9 kg 97.2 kg    Exam:  General:  Appears calm and comfortable and is in NAD, on [place] O2 Eyes:  EOMI, normal lids, iris ENT:  grossly normal hearing, lips & tongue, mmm Cardiovascular:  RRR. No LE edema.  Respiratory:   CTA bilaterally with no wheezes/rales/rhonchi.  Normal respiratory effort. Abdomen:  soft, NT, ND Skin:  no rash or induration seen on limited exam Musculoskeletal:  grossly normal tone BUE/BLE, good ROM, no bony abnormality Psychiatric:  blunted mood and affect, speech fluent and appropriate, AOx3 Neurologic:  CN 2-12 grossly intact, moves all extremities in coordinated fashion  Data Reviewed: I have reviewed the patient's lab results since admission.  Pertinent labs for today include:   Glucose 156 WBC 7.4 Hgb 8.9, stable    Condition at discharge: improving  The results of significant diagnostics from this hospitalization (including imaging, microbiology, ancillary and laboratory) are listed below for reference.   Imaging Studies: VAS [place] LOWER EXTREMITY VENOUS (DVT) Result Date: 09/18/2023  Lower Venous DVT Study Patient Name:  ROMMEL HOGSTON  Date of Exam:   09/17/2023 Medical Rec #: [id number]      Accession #:    [id number] Date of Birth: 1961-03-09     Patient Gender: M Patient Age:   13 years Exam Location:  [facility] Procedure:      VAS [place] LOWER EXTREMITY VENOUS (DVT) Referring Phys: [facility] GOEL --------------------------------------------------------------------------------  Indications: Pulmonary embolism.  Comparison Study: No previous exams Performing Technologist: Jody Hill RVT, RDMS  Examination Guidelines: A complete evaluation includes B-mode imaging, spectral Doppler, color Doppler, and power Doppler as needed of all accessible portions of each vessel. Bilateral testing is considered an integral part of a complete examination. Limited examinations for reoccurring indications may be performed as noted. The  reflux portion of the exam is performed with the patient in reverse Trendelenburg.  +---------+---------------+---------+-----------+----------+--------------+ RIGHT    CompressibilityPhasicitySpontaneityPropertiesThrombus Aging +---------+---------------+---------+-----------+----------+--------------+ CFV      Full           Yes      Yes                                 +---------+---------------+---------+-----------+----------+--------------+ SFJ      Full                                                        +---------+---------------+---------+-----------+----------+--------------+ FV Prox  Full  Yes      Yes                                 +---------+---------------+---------+-----------+----------+--------------+ FV Mid   Full           Yes      Yes                                 +---------+---------------+---------+-----------+----------+--------------+ FV DistalFull           Yes      Yes                                 +---------+---------------+---------+-----------+----------+--------------+ PFV      Full                                                        +---------+---------------+---------+-----------+----------+--------------+ POP      Full           Yes      Yes                                 +---------+---------------+---------+-----------+----------+--------------+ PTV      Full                                                        +---------+---------------+---------+-----------+----------+--------------+ PERO     Full                                                        +---------+---------------+---------+-----------+----------+--------------+   +---------+---------------+---------+-----------+----------+--------------+ LEFT     CompressibilityPhasicitySpontaneityPropertiesThrombus Aging +---------+---------------+---------+-----------+----------+--------------+ CFV      Full           Yes       Yes                                 +---------+---------------+---------+-----------+----------+--------------+ SFJ      Full                                                        +---------+---------------+---------+-----------+----------+--------------+ FV Prox  Full           Yes      Yes                                 +---------+---------------+---------+-----------+----------+--------------+ FV Mid   Full           Yes      Yes                                 +---------+---------------+---------+-----------+----------+--------------+  FV DistalFull           Yes      Yes                                 +---------+---------------+---------+-----------+----------+--------------+ PFV      Full                                                        +---------+---------------+---------+-----------+----------+--------------+ POP      Full           Yes      Yes                                 +---------+---------------+---------+-----------+----------+--------------+ PTV      Full                                                        +---------+---------------+---------+-----------+----------+--------------+ PERO     Full                                                        +---------+---------------+---------+-----------+----------+--------------+     Summary: BILATERAL: - No evidence of deep vein thrombosis seen in the lower extremities, bilaterally. -No evidence of popliteal cyst, bilaterally.   *See table(s) above for measurements and observations. Electronically signed by Coral Else MD on 09/18/2023 at 8:59:10 AM.    Final    IR THROMB F/U EVAL ART/VEN FINAL DAY (MS) Result Date: 09/17/2023 INDICATION: 63 year old male status post pulmonary artery lysis EXAM: FOLLOW-UP PULMONARY ARTERY LYSIS COMPARISON:  09/16/2023 MEDICATIONS: None. ANESTHESIA/SEDATION: None FLUOROSCOPY: None COMPLICATIONS: None TECHNIQUE: Previous informed written consent was  obtained from the patient after a thorough discussion of the procedural risks, benefits and alternatives. All questions were addressed. Clean technique was utilized including caps, mask, sterile gowns, sterile gloves, sterile drape, hand hygiene and skin antiseptic. A timeout was performed prior to the initiation of the procedure. Bedside trans duction of the pulmonary artery catheters was performed. After recording of the pressures catheters and sheaths were removed. Manual pressure for hemostasis and bandages applied. FINDINGS: Follow-up pressures Right: 37/2 (12) Left: 30/11 (12) IMPRESSION: Bedside trans duction of pulmonary artery catheters reveals significant reduction after pulmonary artery lysis. Signed, Yvone Neu. Miachel Roux, RPVI [facility] Electronically Signed   By: Gilmer Mor D.O.   On: 09/17/2023 13:24   IR Angiogram Pulmonary Bilateral Selective Result Date: 09/16/2023 INDICATION: 62 year old male with intermediate high risk pulmonary embolism presents for therapy EXAM: ULTRASOUND-GUIDED RIGHT INTERNAL JUGULAR VEIN ACCESS X2 PULMONARY ARTERY ANGIOGRAM AND PRESSURE MEASUREMENT BILATERAL PULMONARY ARTERY CATHETER PLACEMENT INITIATION OF THROMBOLYSIS COMPARISON:  None Available. MEDICATIONS: None. ANESTHESIA/SEDATION: Moderate (conscious) sedation was employed during this procedure. A total of Versed 1.5 mg and Fentanyl 75 mcg was administered intravenously by the radiology nurse. Total intra-service moderate Sedation Time: 55  minutes. The patient's level of consciousness and vital signs were monitored continuously by radiology nursing throughout the procedure under my direct supervision. FLUOROSCOPY: Radiation Exposure Index (as provided by the fluoroscopic device): 303 mGy Kerma COMPLICATIONS: None TECHNIQUE: Informed consent was obtained from the patient and the patient's family following explanation of the procedure, risks, benefits  and alternatives. Specific risks include bleeding, infection, contrast reaction, kidney injury, venous injury, life-threatening hemorrhage including brain hemorrhage, gastrointestinal hemorrhage, epistaxis, need for further surgery, need for further procedure, cardiopulmonary collapse, death. The patient understands, agrees and consents for the procedure. All questions were addressed. Patient positioned supine position on the fluoroscopy table. Maximal barrier sterile technique utilized including caps, mask, sterile gowns, sterile gloves, large sterile drape, hand hygiene, and chlorhexidine prep. 1% lidocaine used for local anesthesia. Moderate sedation was provided. Ultrasound survey of the right neck was performed with images stored and sent to PACs, confirming patency of the right internal jugular vein. A micropuncture needle was used access the right internal jugular vein under ultrasound. With venous blood flow returned, an .018 micro wire was passed through the needle, observed enter right heart under fluoroscopy. The needle was removed, and a micropuncture sheath was placed over the wire. The inner dilator and wire were removed, and an 035 Bentson wire was advanced under fluoroscopy into the IVC. The 3 French access was removed on the wire and then a standard 6 French sheath was placed. The dilator was removed and the sheath was flushed. We then proceeded with a tandem venous access for the second catheter. The micro puncture needle was used access the right internal jugular vein under ultrasound. With venous blood flow returned, an .018 micro wire was passed through the needle, observed enter right heart under fluoroscopy. The needle was removed, and a micropuncture sheath was placed over the wire. The inner dilator and wire were removed, and an 035 Bentson wire was advanced under fluoroscopy into the IVC. The 3 French access was removed on the wire and then a second 6 French sheath was placed. The dilator  was removed and the sheath was flushed. Combination of a angled pigtail catheter and a Bentson wire was then used to attempt 2 navigate to the pulmonary artery. The acute angle through the right atrium/tricuspid valve was difficult for the angled pigtail catheter, despite using an angled Bentson wire for additional acute angulation. Ultimately, after several episodes of short ventricular tachycardia produced by a the manipulation of the pigtail catheter, we decided to advance a Glidewire from the position of the pigtail catheter in the right atrium through the outflow tract. The Glidewire ultimately positioned itself within the right pulmonary artery, downgoing branches. The pigtail catheter was removed and we elected to place a 90 cm working length, 15 cm infusion length UniFuse catheter. The catheter was advanced on the Glidewire, position into the downgoing branches, and the operator wire was placed. The alternative sheath was then used for selection of the contralateral pulmonary artery. The angled pigtail catheter was again advanced centrally, attempting to passed through tricuspid valve and through the right ventricle. After initiating another short run of ventricular tachycardia, we elected to use the same technique. The Bentson wire was removed and stiff glidewire was advanced through the pulmonary outflow tract. Once the Glidewire was in the right-sided pulmonary arteries, the pigtail catheter was advanced into the main pulmonary artery and the Glidewire was removed for pressure measurement. Pressure measurement was performed. Glidewire was then again advanced through the pigtail catheter. The angled  pigtail catheter was removed and a 65 cm Kumpe catheter was advanced on the Glidewire. This was used to manipulate the Glidewire into the downgoing branches of the left pulmonary arterial system. Kumpe the catheter was removed. A 15 cm infusion length 90cm working length UniFuse catheter was placed into the  left pulmonary arteries. Wire was removed, small amount of contrast confirmed position and catheter was flushed. The obturator wires were then placed through both the left and right catheters. Sheaths were secured in position.  Final image was stored. The patient tolerated the procedure well and remained hemodynamically stable throughout. No complications were encountered and no significant blood loss was encountered. FINDINGS: Pulmonary artery pressure: 66/31 (44) IMPRESSION: Status post US guided right internal jugular vein access and placement of left and right pulmonary artery lytic catheters for initiation of catheter directed thrombolysis. Signed, Yvone Neu. Miachel Roux, RPVI [facility] PLAN: Patient will be ICU status overnight. Bed rest. Both the right and left catheter will receive 1 mg tPA per hour for 12 hours, for a total dose of 24 mg over 12 hours. Plan for repeat trans duction of catheter pressures after treatment, reassessment, and probable removal of catheters. EVery 6 hour blood draw, including CBC, heparin level, and fibrinogen. Electronically Signed   By: Gilmer Mor D.O.   On: 09/16/2023 16:13   IR INFUSION THROMBOL ARTERIAL INITIAL (MS) Result Date: 09/16/2023 INDICATION: 63 year old male with intermediate high risk pulmonary embolism presents for therapy EXAM: ULTRASOUND-GUIDED RIGHT INTERNAL JUGULAR VEIN ACCESS X2 PULMONARY ARTERY ANGIOGRAM AND PRESSURE MEASUREMENT BILATERAL PULMONARY ARTERY CATHETER PLACEMENT INITIATION OF THROMBOLYSIS COMPARISON:  None Available. MEDICATIONS: None. ANESTHESIA/SEDATION: Moderate (conscious) sedation was employed during this procedure. A total of Versed 1.5 mg and Fentanyl 75 mcg was administered intravenously by the radiology nurse. Total intra-service moderate Sedation Time: 55 minutes. The patient's level of consciousness and vital signs were monitored continuously by radiology nursing  throughout the procedure under my direct supervision. FLUOROSCOPY: Radiation Exposure Index (as provided by the fluoroscopic device): 303 mGy Kerma COMPLICATIONS: None TECHNIQUE: Informed consent was obtained from the patient and the patient's family following explanation of the procedure, risks, benefits and alternatives. Specific risks include bleeding, infection, contrast reaction, kidney injury, venous injury, life-threatening hemorrhage including brain hemorrhage, gastrointestinal hemorrhage, epistaxis, need for further surgery, need for further procedure, cardiopulmonary collapse, death. The patient understands, agrees and consents for the procedure. All questions were addressed. Patient positioned supine position on the fluoroscopy table. Maximal barrier sterile technique utilized including caps, mask, sterile gowns, sterile gloves, large sterile drape, hand hygiene, and chlorhexidine prep. 1% lidocaine used for local anesthesia. Moderate sedation was provided. Ultrasound survey of the right neck was performed with images stored and sent to PACs, confirming patency of the right internal jugular vein. A micropuncture needle was used access the right internal jugular vein under ultrasound. With venous blood flow returned, an .018 micro wire was passed through the needle, observed enter right heart under fluoroscopy. The needle was removed, and a micropuncture sheath was placed over the wire. The inner dilator and wire were removed, and an 035 Bentson wire was advanced under fluoroscopy into the IVC. The 3 French access was removed on the wire and then a standard 6 French sheath was placed. The dilator was removed and the sheath was flushed. We then proceeded with a tandem venous access for the second catheter. The micro puncture needle was used access the right internal jugular vein under ultrasound. With  venous blood flow returned, an .018 micro wire was passed through the needle, observed enter right heart  under fluoroscopy. The needle was removed, and a micropuncture sheath was placed over the wire. The inner dilator and wire were removed, and an 035 Bentson wire was advanced under fluoroscopy into the IVC. The 3 French access was removed on the wire and then a second 6 French sheath was placed. The dilator was removed and the sheath was flushed. Combination of a angled pigtail catheter and a Bentson wire was then used to attempt 2 navigate to the pulmonary artery. The acute angle through the right atrium/tricuspid valve was difficult for the angled pigtail catheter, despite using an angled Bentson wire for additional acute angulation. Ultimately, after several episodes of short ventricular tachycardia produced by a the manipulation of the pigtail catheter, we decided to advance a Glidewire from the position of the pigtail catheter in the right atrium through the outflow tract. The Glidewire ultimately positioned itself within the right pulmonary artery, downgoing branches. The pigtail catheter was removed and we elected to place a 90 cm working length, 15 cm infusion length UniFuse catheter. The catheter was advanced on the Glidewire, position into the downgoing branches, and the operator wire was placed. The alternative sheath was then used for selection of the contralateral pulmonary artery. The angled pigtail catheter was again advanced centrally, attempting to passed through tricuspid valve and through the right ventricle. After initiating another short run of ventricular tachycardia, we elected to use the same technique. The Bentson wire was removed and stiff glidewire was advanced through the pulmonary outflow tract. Once the Glidewire was in the right-sided pulmonary arteries, the pigtail catheter was advanced into the main pulmonary artery and the Glidewire was removed for pressure measurement. Pressure measurement was performed. Glidewire was then again advanced through the pigtail catheter. The angled  pigtail catheter was removed and a 65 cm Kumpe catheter was advanced on the Glidewire. This was used to manipulate the Glidewire into the downgoing branches of the left pulmonary arterial system. Kumpe the catheter was removed. A 15 cm infusion length 90cm working length UniFuse catheter was placed into the left pulmonary arteries. Wire was removed, small amount of contrast confirmed position and catheter was flushed. The obturator wires were then placed through both the left and right catheters. Sheaths were secured in position.  Final image was stored. The patient tolerated the procedure well and remained hemodynamically stable throughout. No complications were encountered and no significant blood loss was encountered. FINDINGS: Pulmonary artery pressure: 66/31 (44) IMPRESSION: Status post US guided right internal jugular vein access and placement of left and right pulmonary artery lytic catheters for initiation of catheter directed thrombolysis. Signed, Yvone Neu. Miachel Roux, RPVI [facility] Radiology PLAN: Patient will be ICU status overnight. Bed rest. Both the right and left catheter will receive 1 mg tPA per hour for 12 hours, for a total dose of 24 mg over 12 hours. Plan for repeat trans duction of catheter pressures after treatment, reassessment, and probable removal of catheters. EVery 6 hour blood draw, including CBC, heparin level, and fibrinogen. Electronically Signed   By: Gilmer Mor D.O.   On: 09/16/2023 16:13   IR INFUSION THROMBOL ARTERIAL INITIAL (MS) Result Date: 09/16/2023 INDICATION: 63 year old male with intermediate high risk pulmonary embolism presents for therapy EXAM: ULTRASOUND-GUIDED RIGHT INTERNAL JUGULAR VEIN ACCESS X2 PULMONARY ARTERY ANGIOGRAM AND PRESSURE MEASUREMENT BILATERAL PULMONARY ARTERY CATHETER PLACEMENT INITIATION OF THROMBOLYSIS COMPARISON:  None Available. MEDICATIONS: None. ANESTHESIA/SEDATION: Moderate  (conscious) sedation was employed during this procedure. A total of Versed 1.5 mg and Fentanyl 75 mcg was administered intravenously by the radiology nurse. Total intra-service moderate Sedation Time: 55 minutes. The patient's level of consciousness and vital signs were monitored continuously by radiology nursing throughout the procedure under my direct supervision. FLUOROSCOPY: Radiation Exposure Index (as provided by the fluoroscopic device): 303 mGy Kerma COMPLICATIONS: None TECHNIQUE: Informed consent was obtained from the patient and the patient's family following explanation of the procedure, risks, benefits and alternatives. Specific risks include bleeding, infection, contrast reaction, kidney injury, venous injury, life-threatening hemorrhage including brain hemorrhage, gastrointestinal hemorrhage, epistaxis, need for further surgery, need for further procedure, cardiopulmonary collapse, death. The patient understands, agrees and consents for the procedure. All questions were addressed. Patient positioned supine position on the fluoroscopy table. Maximal barrier sterile technique utilized including caps, mask, sterile gowns, sterile gloves, large sterile drape, hand hygiene, and chlorhexidine prep. 1% lidocaine used for local anesthesia. Moderate sedation was provided. Ultrasound survey of the right neck was performed with images stored and sent to PACs, confirming patency of the right internal jugular vein. A micropuncture needle was used access the right internal jugular vein under ultrasound. With venous blood flow returned, an .018 micro wire was passed through the needle, observed enter right heart under fluoroscopy. The needle was removed, and a micropuncture sheath was placed over the wire. The inner dilator and wire were removed, and an 035 Bentson wire was advanced under fluoroscopy into the IVC. The 3 French access was removed on the wire and then a standard 6 French sheath was placed. The dilator  was removed and the sheath was flushed. We then proceeded with a tandem venous access for the second catheter. The micro puncture needle was used access the right internal jugular vein under ultrasound. With venous blood flow returned, an .018 micro wire was passed through the needle, observed enter right heart under fluoroscopy. The needle was removed, and a micropuncture sheath was placed over the wire. The inner dilator and wire were removed, and an 035 Bentson wire was advanced under fluoroscopy into the IVC. The 3 French access was removed on the wire and then a second 6 French sheath was placed. The dilator was removed and the sheath was flushed. Combination of a angled pigtail catheter and a Bentson wire was then used to attempt 2 navigate to the pulmonary artery. The acute angle through the right atrium/tricuspid valve was difficult for the angled pigtail catheter, despite using an angled Bentson wire for additional acute angulation. Ultimately, after several episodes of short ventricular tachycardia produced by a the manipulation of the pigtail catheter, we decided to advance a Glidewire from the position of the pigtail catheter in the right atrium through the outflow tract. The Glidewire ultimately positioned itself within the right pulmonary artery, downgoing branches. The pigtail catheter was removed and we elected to place a 90 cm working length, 15 cm infusion length UniFuse catheter. The catheter was advanced on the Glidewire, position into the downgoing branches, and the operator wire was placed. The alternative sheath was then used for selection of the contralateral pulmonary artery. The angled pigtail catheter was again advanced centrally, attempting to passed through tricuspid valve and through the right ventricle. After initiating another short run of ventricular tachycardia, we elected to use the same technique. The Bentson wire was removed and stiff glidewire was advanced through the pulmonary  outflow tract. Once the Glidewire was in  the right-sided pulmonary arteries, the pigtail catheter was advanced into the main pulmonary artery and the Glidewire was removed for pressure measurement. Pressure measurement was performed. Glidewire was then again advanced through the pigtail catheter. The angled pigtail catheter was removed and a 65 cm Kumpe catheter was advanced on the Glidewire. This was used to manipulate the Glidewire into the downgoing branches of the left pulmonary arterial system. Kumpe the catheter was removed. A 15 cm infusion length 90cm working length UniFuse catheter was placed into the left pulmonary arteries. Wire was removed, small amount of contrast confirmed position and catheter was flushed. The obturator wires were then placed through both the left and right catheters. Sheaths were secured in position.  Final image was stored. The patient tolerated the procedure well and remained hemodynamically stable throughout. No complications were encountered and no significant blood loss was encountered. FINDINGS: Pulmonary artery pressure: 66/31 (44) IMPRESSION: Status post US guided right internal jugular vein access and placement of left and right pulmonary artery lytic catheters for initiation of catheter directed thrombolysis. Signed, Yvone Neu. Miachel Roux, RPVI [facility] Radiology [facility] Radiology PLAN: Patient will be ICU status overnight. Bed rest. Both the right and left catheter will receive 1 mg tPA per hour for 12 hours, for a total dose of 24 mg over 12 hours. Plan for repeat trans duction of catheter pressures after treatment, reassessment, and probable removal of catheters. EVery 6 hour blood draw, including CBC, heparin level, and fibrinogen. Electronically Signed   By: Gilmer Mor D.O.   On: 09/16/2023 16:13   IR US Guide Vasc Access Right Result Date: 09/16/2023 INDICATION: 63 year old male with intermediate high risk pulmonary embolism  presents for therapy EXAM: ULTRASOUND-GUIDED RIGHT INTERNAL JUGULAR VEIN ACCESS X2 PULMONARY ARTERY ANGIOGRAM AND PRESSURE MEASUREMENT BILATERAL PULMONARY ARTERY CATHETER PLACEMENT INITIATION OF THROMBOLYSIS COMPARISON:  None Available. MEDICATIONS: None. ANESTHESIA/SEDATION: Moderate (conscious) sedation was employed during this procedure. A total of Versed 1.5 mg and Fentanyl 75 mcg was administered intravenously by the radiology nurse. Total intra-service moderate Sedation Time: 55 minutes. The patient's level of consciousness and vital signs were monitored continuously by radiology nursing throughout the procedure under my direct supervision. FLUOROSCOPY: Radiation Exposure Index (as provided by the fluoroscopic device): 303 mGy Kerma COMPLICATIONS: None TECHNIQUE: Informed consent was obtained from the patient and the patient's family following explanation of the procedure, risks, benefits and alternatives. Specific risks include bleeding, infection, contrast reaction, kidney injury, venous injury, life-threatening hemorrhage including brain hemorrhage, gastrointestinal hemorrhage, epistaxis, need for further surgery, need for further procedure, cardiopulmonary collapse, death. The patient understands, agrees and consents for the procedure. All questions were addressed. Patient positioned supine position on the fluoroscopy table. Maximal barrier sterile technique utilized including caps, mask, sterile gowns, sterile gloves, large sterile drape, hand hygiene, and chlorhexidine prep. 1% lidocaine used for local anesthesia. Moderate sedation was provided. Ultrasound survey of the right neck was performed with images stored and sent to PACs, confirming patency of the right internal jugular vein. A micropuncture needle was used access the right internal jugular vein under ultrasound. With venous blood flow returned, an .018 micro wire was passed through the needle, observed enter right heart under fluoroscopy. The  needle was removed, and a micropuncture sheath was placed over the wire. The inner dilator and wire were removed, and an 035 Bentson wire was advanced under fluoroscopy into the IVC. The 3 French access was removed on the wire and then a standard 6 [place] sheath was  placed. The dilator was removed and the sheath was flushed. We then proceeded with a tandem venous access for the second catheter. The micro puncture needle was used access the right internal jugular vein under ultrasound. With venous blood flow returned, an .018 micro wire was passed through the needle, observed enter right heart under fluoroscopy. The needle was removed, and a micropuncture sheath was placed over the wire. The inner dilator and wire were removed, and an 035 Bentson wire was advanced under fluoroscopy into the IVC. The 3 French access was removed on the wire and then a second 6 French sheath was placed. The dilator was removed and the sheath was flushed. Combination of a angled pigtail catheter and a Bentson wire was then used to attempt 2 navigate to the pulmonary artery. The acute angle through the right atrium/tricuspid valve was difficult for the angled pigtail catheter, despite using an angled Bentson wire for additional acute angulation. Ultimately, after several episodes of short ventricular tachycardia produced by a the manipulation of the pigtail catheter, we decided to advance a Glidewire from the position of the pigtail catheter in the right atrium through the outflow tract. The Glidewire ultimately positioned itself within the right pulmonary artery, downgoing branches. The pigtail catheter was removed and we elected to place a 90 cm working length, 15 cm infusion length UniFuse catheter. The catheter was advanced on the Glidewire, position into the downgoing branches, and the operator wire was placed. The alternative sheath was then used for selection of the contralateral pulmonary artery. The angled pigtail catheter was  again advanced centrally, attempting to passed through tricuspid valve and through the right ventricle. After initiating another short run of ventricular tachycardia, we elected to use the same technique. The Bentson wire was removed and stiff glidewire was advanced through the pulmonary outflow tract. Once the Glidewire was in the right-sided pulmonary arteries, the pigtail catheter was advanced into the main pulmonary artery and the Glidewire was removed for pressure measurement. Pressure measurement was performed. Glidewire was then again advanced through the pigtail catheter. The angled pigtail catheter was removed and a 65 cm Kumpe catheter was advanced on the Glidewire. This was used to manipulate the Glidewire into the downgoing branches of the left pulmonary arterial system. Kumpe the catheter was removed. A 15 cm infusion length 90cm working length UniFuse catheter was placed into the left pulmonary arteries. Wire was removed, small amount of contrast confirmed position and catheter was flushed. The obturator wires were then placed through both the left and right catheters. Sheaths were secured in position.  Final image was stored. The patient tolerated the procedure well and remained hemodynamically stable throughout. No complications were encountered and no significant blood loss was encountered. FINDINGS: Pulmonary artery pressure: 66/31 (44) IMPRESSION: Status post US guided right internal jugular vein access and placement of left and right pulmonary artery lytic catheters for initiation of catheter directed thrombolysis. Signed, Yvone Neu. Miachel Roux, RPVI [facility] Inter[facility] Radiology Specialists [facility] Radiology PLAN: Patient will be ICU status overnight. Bed rest. Both the right and left catheter will receive 1 mg tPA per hour for 12 hours, for a total dose of 24 mg over 12 hours. Plan for repeat trans duction of catheter pressures after treatment, reassessment, and probable removal  of catheters. EVery 6 hour blood draw, including CBC, heparin level, and fibrinogen. Electronically Signed   By: Gilmer Mor D.O.   On: 09/16/2023 16:13   IR US Guide Vasc Access Right Result Date:  09/16/2023 INDICATION: 63 year old male with intermediate high risk pulmonary embolism presents for therapy EXAM: ULTRASOUND-GUIDED RIGHT INTERNAL JUGULAR VEIN ACCESS X2 PULMONARY ARTERY ANGIOGRAM AND PRESSURE MEASUREMENT BILATERAL PULMONARY ARTERY CATHETER PLACEMENT INITIATION OF THROMBOLYSIS COMPARISON:  None Available. MEDICATIONS: None. ANESTHESIA/SEDATION: Moderate (conscious) sedation was employed during this procedure. A total of Versed 1.5 mg and Fentanyl 75 mcg was administered intravenously by the radiology nurse. Total intra-service moderate Sedation Time: 55 minutes. The patient's level of consciousness and vital signs were monitored continuously by radiology nursing throughout the procedure under my direct supervision. FLUOROSCOPY: Radiation Exposure Index (as provided by the fluoroscopic device): 303 mGy Kerma COMPLICATIONS: None TECHNIQUE: Informed consent was obtained from the patient and the patient's family following explanation of the procedure, risks, benefits and alternatives. Specific risks include bleeding, infection, contrast reaction, kidney injury, venous injury, life-threatening hemorrhage including brain hemorrhage, gastrointestinal hemorrhage, epistaxis, need for further surgery, need for further procedure, cardiopulmonary collapse, death. The patient understands, agrees and consents for the procedure. All questions were addressed. Patient positioned supine position on the fluoroscopy table. Maximal barrier sterile technique utilized including caps, mask, sterile gowns, sterile gloves, large sterile drape, hand hygiene, and chlorhexidine prep. 1% lidocaine used for local anesthesia. Moderate sedation was provided. Ultrasound survey of the right neck was performed with images stored and  sent to PACs, confirming patency of the right internal jugular vein. A micropuncture needle was used access the right internal jugular vein under ultrasound. With venous blood flow returned, an .018 micro wire was passed through the needle, observed enter right heart under fluoroscopy. The needle was removed, and a micropuncture sheath was placed over the wire. The inner dilator and wire were removed, and an 035 Bentson wire was advanced under fluoroscopy into the IVC. The 3 French access was removed on the wire and then a standard 6 French sheath was placed. The dilator was removed and the sheath was flushed. We then proceeded with a tandem venous access for the second catheter. The micro puncture needle was used access the right internal jugular vein under ultrasound. With venous blood flow returned, an .018 micro wire was passed through the needle, observed enter right heart under fluoroscopy. The needle was removed, and a micropuncture sheath was placed over the wire. The inner dilator and wire were removed, and an 035 Bentson wire was advanced under fluoroscopy into the IVC. The 3 French access was removed on the wire and then a second 6 French sheath was placed. The dilator was removed and the sheath was flushed. Combination of a angled pigtail catheter and a Bentson wire was then used to attempt 2 navigate to the pulmonary artery. The acute angle through the right atrium/tricuspid valve was difficult for the angled pigtail catheter, despite using an angled Bentson wire for additional acute angulation. Ultimately, after several episodes of short ventricular tachycardia produced by a the manipulation of the pigtail catheter, we decided to advance a Glidewire from the position of the pigtail catheter in the right atrium through the outflow tract. The Glidewire ultimately positioned itself within the right pulmonary artery, downgoing branches. The pigtail catheter was removed and we elected to place a 90 cm  working length, 15 cm infusion length UniFuse catheter. The catheter was advanced on the Glidewire, position into the downgoing branches, and the operator wire was placed. The alternative sheath was then used for selection of the contralateral pulmonary artery. The angled pigtail catheter was again advanced centrally, attempting to passed through tricuspid valve and through the right  ventricle. After initiating another short run of ventricular tachycardia, we elected to use the same technique. The Bentson wire was removed and stiff glidewire was advanced through the pulmonary outflow tract. Once the Glidewire was in the right-sided pulmonary arteries, the pigtail catheter was advanced into the main pulmonary artery and the Glidewire was removed for pressure measurement. Pressure measurement was performed. Glidewire was then again advanced through the pigtail catheter. The angled pigtail catheter was removed and a 65 cm Kumpe catheter was advanced on the Glidewire. This was used to manipulate the Glidewire into the downgoing branches of the left pulmonary arterial system. Kumpe the catheter was removed. A 15 cm infusion length 90cm working length UniFuse catheter was placed into the left pulmonary arteries. Wire was removed, small amount of contrast confirmed position and catheter was flushed. The obturator wires were then placed through both the left and right catheters. Sheaths were secured in position.  Final image was stored. The patient tolerated the procedure well and remained hemodynamically stable throughout. No complications were encountered and no significant blood loss was encountered. FINDINGS: Pulmonary artery pressure: 66/31 (44) IMPRESSION: Status post US guided right internal jugular vein access and placement of left and right pulmonary artery lytic catheters for initiation of catheter directed thrombolysis. Signed, Yvone Neu. Miachel Roux, RPVI [facility] Inter[facility] Radiology Specialists  [facility] Radiology PLAN: Patient will be ICU status overnight. Bed rest. Both the right and left catheter will receive 1 mg tPA per hour for 12 hours, for a total dose of 24 mg over 12 hours. Plan for repeat trans duction of catheter pressures after treatment, reassessment, and probable removal of catheters. EVery 6 hour blood draw, including CBC, heparin level, and fibrinogen. Electronically Signed   By: Gilmer Mor D.O.   On: 09/16/2023 16:13   CT ABDOMEN PELVIS W CONTRAST Result Date: 09/16/2023 CLINICAL DATA:  Acute pulmonary embolism. Evaluation for occult malignancy. * Tracking Code: BO * EXAM: CT ABDOMEN AND PELVIS WITH CONTRAST TECHNIQUE: Multidetector CT imaging of the abdomen and pelvis was performed using the standard protocol following bolus administration of intravenous contrast. RADIATION DOSE REDUCTION: This exam was performed according to the departmental dose-optimization program which includes automated exposure control, adjustment of the mA and/or kV according to patient size and/or use of iterative reconstruction technique. CONTRAST:  OMNIPAQUE IOHEXOL 300 MG/ML  SOLN COMPARISON:  Chest CTA on 09/15/2023 FINDINGS: Lower chest: Pulmonary embolism again seen in the visualized lower lobe pulmonary arteries bilaterally. Small right pleural effusion and right lower lobe atelectasis or infarct show no significant change. Hepatobiliary: No hepatic masses identified. Gallbladder is unremarkable. No evidence of biliary ductal dilatation. Pancreas:  No mass or inflammatory changes. Spleen:  Within normal limits in size and appearance. Adrenals/Urinary Tract: No suspicious masses identified. No evidence of hydronephrosis. Bladder is unremarkable. Stomach/Bowel: Moderate to large hiatal hernia noted. No masses identified. No evidence of obstruction, inflammatory process or abnormal fluid collections. Vascular/Lymphatic: No pathologically enlarged lymph nodes identified. Nonocclusive DVT seen in  the infrahepatic IVC. Reproductive:  No mass or other significant abnormality. Other:  None. Musculoskeletal:  No suspicious bone lesions identified. IMPRESSION: No evidence of abdominal or pelvic malignancy. Nonocclusive DVT in IVC. Moderate to large hiatal hernia. Bilateral lower lobe pulmonary embolism, with small right pleural effusion and right lower lobe atelectasis or infarct, without change compared to recent chest CTA. Electronically Signed   By: Danae Orleans M.D.   On: 09/16/2023 11:36   ECHOCARDIOGRAM COMPLETE Result Date: 09/16/2023  ECHOCARDIOGRAM REPORT   Patient Name:   EVERHETT BOZARD Date of Exam: 09/16/2023 Medical Rec #:  [id number]     Height:       70.0 in Accession #:    [id number]    Weight:       214.3 lb Date of Birth:  [birth year]    BSA:          2.149 m Patient Age:    62 years      BP:           163/95 mmHg Patient Gender: M             HR:           88 bpm. Exam Location:  Inpatient Procedure: 2D Echo, Cardiac Doppler, Color Doppler and Intracardiac            Opacification Agent (Both Spectral and Color Flow Doppler were            utilized during procedure). Indications:    I26.02 Pulmonary embolus  History:        Patient has prior history of Echocardiogram examinations, most                 recent 05/06/2022. Signs/Symptoms:Syncope; Risk                 Factors:Hypertension and Dyslipidemia.  Sonographer:    Sheralyn Boatman RDCS Referring Phys: [id number] [facility]  Sonographer Comments: Technically difficult study due to poor echo windows. Image acquisition challenging due to patient body habitus. IMPRESSIONS  1. Left ventricular ejection fraction, by estimation, is 50 to 55%. Left ventricular ejection fraction by 2D MOD biplane is 54.2 %. The left ventricle has low normal function. The left ventricle has no regional wall motion abnormalities. There is mild left ventricular hypertrophy. Left ventricular diastolic parameters are consistent with Grade I diastolic dysfunction (impaired  relaxation). There is the interventricular septum is flattened in systole and diastole, consistent with right ventricular pressure and volume overload.  2. Right ventricular systolic function is severely reduced. The right ventricular size is severely enlarged. Tricuspid regurgitation signal is inadequate for assessing PA pressure, however increased right heart pressure is suspected.  3. Right atrial size was moderately dilated.  4. The mitral valve is normal in structure. No evidence of mitral valve regurgitation. No evidence of mitral stenosis.  5. The aortic valve was not well visualized. Aortic valve regurgitation is not visualized. No aortic stenosis is present.  6. The inferior vena cava is dilated in size with <50% respiratory variability, suggesting right atrial pressure of 15 mmHg. Comparison(s): Changes from prior study are noted. 05/06/2022: LVEF 70-75%, hyperdynamic RV function. Echo now consistent with severe RV systolic dysfunction and right heart strain, suggestive of hemodynamically significant pulmonary embolism. Conclusion(s)/Recommendation(s): Critical findings reported to Dr. De Hollingshead and acknowledged at 09/16/2023 at 11:33 am. FINDINGS  Left Ventricle: Left ventricular ejection fraction, by estimation, is 50 to 55%. Left ventricular ejection fraction by 2D MOD biplane is 54.2 %. The left ventricle has low normal function. The left ventricle has no regional wall motion abnormalities. Definity contrast agent was given IV to delineate the left ventricular endocardial borders. There is mild left ventricular hypertrophy. The interventricular septum is flattened in systole and diastole, consistent with right ventricular pressure and volume overload. Left ventricular diastolic parameters are consistent with Grade I diastolic dysfunction (impaired relaxation). Indeterminate filling pressures. Right Ventricle: The right ventricular size is severely enlarged. No increase in right ventricular wall thickness.  Right ventricular systolic function is severely reduced. Tricuspid regurgitation signal is inadequate for assessing PA pressure. Left Atrium: Left atrial size was normal in size. Right Atrium: Right atrial size was moderately dilated. Pericardium: There is no evidence of pericardial effusion. Mitral Valve: The mitral valve is normal in structure. No evidence of mitral valve regurgitation. No evidence of mitral valve stenosis. Tricuspid Valve: The tricuspid valve is not well visualized. Tricuspid valve regurgitation is not demonstrated. No evidence of tricuspid stenosis. Aortic Valve: The aortic valve was not well visualized. Aortic valve regurgitation is not visualized. No aortic stenosis is present. Pulmonic Valve: The pulmonic valve was not well visualized. Pulmonic valve regurgitation is not visualized. No evidence of pulmonic stenosis. Aorta: The aortic root and ascending aorta are structurally normal, with no evidence of dilitation. Venous: The inferior vena cava is dilated in size with less than 50% respiratory variability, suggesting right atrial pressure of 15 mmHg. IAS/Shunts: No atrial level shunt detected by color flow Doppler.  LEFT VENTRICLE PLAX 2D                        Biplane EF (MOD) LVIDd:         3.70 cm         LV Biplane EF:   Left LVIDs:         2.70 cm                          ventricular LV PW:         1.10 cm                          ejection LV IVS:        1.10 cm                          fraction by LVOT diam:     2.30 cm                          2D MOD LV SV:         59                               biplane is LV SV Index:   28                               54.2 %. LVOT Area:     4.15 cm                                Diastology                                LV e' medial:    5.22 cm/s LV Volumes (MOD)               LV E/e' medial:  9.2 LV vol d, MOD    102.0 ml      LV e' lateral:   7.40 cm/s A2C:  LV E/e' lateral: 6.5 LV vol d, MOD    79.2 ml A4C: LV vol s,  MOD    45.4 ml A2C: LV vol s, MOD    36.4 ml A4C: LV SV MOD A2C:   56.6 ml LV SV MOD A4C:   79.2 ml LV SV MOD BP:    49.8 ml RIGHT VENTRICLE             IVC RV S prime:     11.30 cm/s  IVC diam: 2.70 cm TAPSE (M-mode): 1.5 cm LEFT ATRIUM             Index        RIGHT ATRIUM           Index LA diam:        3.60 cm 1.67 cm/m   RA Area:     26.00 cm LA Vol (A2C):   30.3 ml 14.10 ml/m  RA Volume:   83.90 ml  39.03 ml/m LA Vol (A4C):   27.0 ml 12.56 ml/m LA Biplane Vol: 29.1 ml 13.54 ml/m  AORTIC VALVE LVOT Vmax:   100.00 cm/s LVOT Vmean:  62.600 cm/s LVOT VTI:    0.143 m  AORTA Ao Root diam: 3.45 cm Ao Asc diam:  3.70 cm MITRAL VALVE MV Area (PHT): 3.65 cm    SHUNTS MV Decel Time: 208 msec    Systemic VTI:  0.14 m MV E velocity: 47.90 cm/s  Systemic Diam: 2.30 cm MV A velocity: 72.40 cm/s MV E/A ratio:  0.66 Zoila Shutter MD Electronically signed by Zoila Shutter MD Signature Date/Time: 09/16/2023/11:35:25 AM    Final    CT Angio Chest PE W and/or Wo Contrast Result Date: 09/15/2023 CLINICAL DATA:  Shortness of breath.  Hypoxia and fatigue EXAM: CT ANGIOGRAPHY CHEST WITH CONTRAST TECHNIQUE: Multidetector CT imaging of the chest was performed using the standard protocol during bolus administration of intravenous contrast. Multiplanar CT image reconstructions and MIPs were obtained to evaluate the vascular anatomy. RADIATION DOSE REDUCTION: This exam was performed according to the departmental dose-optimization program which includes automated exposure control, adjustment of the mA and/or kV according to patient size and/or use of iterative reconstruction technique. CONTRAST:  75mL OMNIPAQUE IOHEXOL 350 MG/ML SOLN COMPARISON:  X-ray earlier 09/15/2023 and older FINDINGS: Cardiovascular: Bilateral pulmonary emboli identified including some central clot within the main left pulmonary artery. Large areas of thrombus seen in the central lobar vessels on the left as well as segmental vessels and peripheral. There is  also central clot within the pulmonary artery extending to the right lower lobe, lobar, segmental. More mild areas thrombi in the right upper lobe. Large clot burden. There is some flattening of the intraventricular septum. Please correlate for other clinical findings of right heart strain. Heart itself is nonenlarged. There is no reflux of contrast into the intrahepatic IVC. Trace pericardial fluid. Thoracic aorta has a normal course and caliber with slight partially calcified plaque. There is a bovine type aortic arch, normal variant. Mediastinum/Nodes: Large hiatal hernia. Slightly patulous esophagus. Preserved thyroid gland. No specific abnormal lymph node enlargement identified in the axillary regions, hilum or mediastinum. Only a few small less than 1 cm size right hilar nodes are seen and mediastinal nodes, not pathologic by size criteria. Lungs/Pleura: There is a small right pleural effusion with some lung base opacities. Infiltrate is possible. With the pulmonary embolus a developing infarct is also in the differential. Recommend simple follow-up. Please correlate with clinical presentation. No pneumothorax. No  left-sided effusion or consolidation. Some breathing motion. Upper Abdomen: Adrenal glands are grossly preserved in the upper abdomen. Lobular contour to the spleen, nonspecific. Musculoskeletal: Scattered degenerative changes seen along the spine. Critical Value/emergent results were called by telephone at the time of interpretation on 09/15/2023 at 6:29 pm to provider [facility] , who verbally acknowledged these results. Review of the MIP images confirms the above findings. IMPRESSION: Pulmonary emboli bilaterally including some large central areas. Extensive distribution of thrombus along the lower lobes greater than left upper lobe greater than right upper lobe. There is also some flattening of the intraventricular septum. No reflux of contrast into the intrahepatic IVC. Please correlate with  any clinical evidence of right heart strain. Small right pleural effusion with adjacent lung opacity. This would have a differential including infiltrate. Infarct would also be in the differential with the level of pulmonary emboli. Aortic Atherosclerosis (ICD10-I70.0). Electronically Signed   By: Karen Kays M.D.   On: 09/15/2023 18:31   DG Chest Port 1 View Result Date: 09/15/2023 CLINICAL DATA:  Short of breath, hypoxia, fatigue EXAM: PORTABLE CHEST 1 VIEW COMPARISON:  02/11/2018 FINDINGS: Single frontal view of the chest demonstrates a stable cardiac silhouette. Patchy opacities are seen at the right lung base, which could reflect pneumonia or atelectasis. Left chest is clear. No effusion or pneumothorax. IMPRESSION: 1. Patchy right basilar consolidation, which could be hypoventilatory or related to bronchopneumonia. Electronically Signed   By: Sharlet Salina M.D.   On: 09/15/2023 16:57    Microbiology: Results for orders placed or performed during the hospital encounter of 09/15/23  Culture, blood (routine x 2)     Status: None (Preliminary result)   Collection Time: 09/15/23  5:15 PM   Specimen: BLOOD  Result Value Ref Range Status   Specimen Description   Final    BLOOD SITE NOT SPECIFIED Performed at [facility], [address] [place], [place] [address]    Special Requests   Final    BOTTLES DRAWN AEROBIC AND ANAEROBIC Blood Culture results may not be optimal due to an inadequate volume of blood received in culture bottles Performed at [facility], [address] [place], [place] [address]    Culture   Final    NO GROWTH 4 DAYS Performed at [facility], [address] [place], [place] [address]    Report Status PENDING  Incomplete  Culture, blood (routine x 2)     Status: None (Preliminary result)   Collection Time: 09/15/23  5:25 PM   Specimen: BLOOD  Result Value Ref Range Status   Specimen Description   Final    BLOOD SITE  NOT SPECIFIED Performed at [facility], [address], [place], [place] [address]    Special Requests   Final    BOTTLES DRAWN AEROBIC AND ANAEROBIC Blood Culture results may not be optimal due to an inadequate volume of blood received in culture bottles Performed at [facility], [address] [place], [place] [address]    Culture   Final    NO GROWTH 4 DAYS Performed at [facility], [address], [place], [place] [address]    Report Status PENDING  Incomplete  MRSA Next Gen by PCR, Nasal     Status: None   Collection Time: 09/15/23  9:59 PM   Specimen: Nasal Mucosa; Nasal Swab  Result Value Ref Range Status   MRSA by PCR Next Gen NOT DETECTED NOT  DETECTED Final    Comment: (NOTE) The GeneXpert MRSA Assay (FDA approved for NASAL specimens only), is one component of a comprehensive MRSA colonization surveillance program. It is not intended to diagnose MRSA infection nor to guide or monitor treatment for MRSA infections. Test performance is not FDA approved in patients less than 35 years old. Performed at [facility], [address] [place], [place] [address]     Labs: CBC: Recent Labs  Lab 09/15/23 1607 09/16/23 0553 09/17/23 0305 09/17/23 0939 09/17/23 1656 09/18/23 0119 09/19/23 0907  WBC 13.9*   < > 12.4* 13.1* 11.6* 11.3* 7.4  NEUTROABS 12.3*  --   --   --   --   --  5.4  HGB 7.7*   < > 8.8* 8.2* 8.8* 8.6* 8.9*  HCT 30.2*   < > 32.8* 31.1* 34.7* 32.7* 35.1*  MCV 65.7*   < > 67.8* 68.1* 71.0* 68.6* 70.6*  PLT 692*   < > 481* 447* 453* 444* 397   < > = values in this interval not displayed.   Basic Metabolic Panel: Recent Labs  Lab 09/15/23 1607 09/16/23 0553 09/17/23 0305 09/18/23 0119 09/19/23 0907  NA 137 138 135 138 138  K 4.1 3.8 3.7 3.7 3.6  CL 102 103 104 103 105  CO2 23 22 21* 24 24  GLUCOSE 137* 104* 117* 105* 156*  BUN 16 14 9 11 15   CREATININE 0.95 0.89 0.68 0.87 0.96   CALCIUM 8.9 8.6* 8.4* 8.3* 8.5*  MG  --   --  1.9 2.1  --    Liver Function Tests: Recent Labs  Lab 09/15/23 1607 09/17/23 0305 09/18/23 0119  AST 12* 13* 8*  ALT 16 12 12   ALKPHOS 81 73 68  BILITOT 0.7 0.8 0.6  PROT 8.5* 7.5 7.1  ALBUMIN 2.8* 2.6* 2.3*   CBG: No results for input(s): "GLUCAP" in the last 168 hours.  Discharge time spent: greater than 30 minutes.  Signed: Jonah Blue, MD [facility] 09/19/2023

## 2023-09-20 LAB — UPEP/UIFE/LIGHT CHAINS/TP, 24-HR UR
% BETA, Urine: 19.5 %
ALPHA 1 URINE: 5.8 %
Albumin, U: 29.6 %
Alpha 2, Urine: 17 %
Free Kappa Lt Chains,Ur: 105.06 mg/L — ABNORMAL HIGH (ref 1.17–86.46)
Free Kappa/Lambda Ratio: 4.72 (ref 1.83–14.26)
Free Lambda Lt Chains,Ur: 22.28 mg/L — ABNORMAL HIGH (ref 0.27–15.21)
GAMMA GLOBULIN URINE: 28.2 %
Total Protein, Urine-Ur/day: 639 mg/(24.h) — ABNORMAL HIGH (ref 30–150)
Total Protein, Urine: 16.6 mg/dL
Total Volume: 3850

## 2023-09-20 LAB — CULTURE, BLOOD (ROUTINE X 2)
Culture: NO GROWTH
Culture: NO GROWTH

## 2023-10-13 NOTE — Progress Notes (Signed)
 Addendum: Reviewed and agree with assessment and management plan. After hospitalization he was found to have acute pulmonary embolism He will need to be rescheduled for follow-up with us  though now he is on anticoagulation Aftyn Nott, Amber Bail, MD

## 2023-10-25 ENCOUNTER — Encounter: Payer: Self-pay | Admitting: Internal Medicine

## 2023-10-25 ENCOUNTER — Ambulatory Visit: Payer: Self-pay | Admitting: Internal Medicine

## 2023-10-25 VITALS — BP 124/62 | HR 88 | Temp 98.4°F | Ht 70.0 in | Wt 220.6 lb

## 2023-10-25 DIAGNOSIS — J9611 Chronic respiratory failure with hypoxia: Secondary | ICD-10-CM

## 2023-10-25 DIAGNOSIS — I2602 Saddle embolus of pulmonary artery with acute cor pulmonale: Secondary | ICD-10-CM | POA: Diagnosis not present

## 2023-10-25 DIAGNOSIS — R0602 Shortness of breath: Secondary | ICD-10-CM

## 2023-10-25 NOTE — Patient Instructions (Addendum)
 It was a pleasure to see you today!  Please schedule follow up with myself in 5 months.  If my schedule is not open yet, we will contact you with a reminder closer to that time. Please call [phone] if you haven't heard from us  a month before, and always call us  sooner if issues or concerns arise. You can also send us  a message through MyChart, but but aware that this is not to be used for urgent issues and it may take up to 5-7 days to receive a reply. Please be aware that you will likely be able to view your results before I have a chance to respond to them. Please give us  5 business days to respond to any non-urgent results.    Before your next visit I would like you to have: Echocardiogram in July 2025 Overnight oximetry test  Continue eliquis  5 mg twice daily Get the echocardiogram in July for your heart. Will order an overnight oximetry test to see if you still need oxygen at night.  Follow up with gastroenterology for your colon cancer screening.   Try topical steroid ointment 1% hydrocortisone for the rash on your back. If no improvement follow up with PCP.

## 2023-10-25 NOTE — Progress Notes (Signed)
 Jeffrey Potter    [id number]    1960-12-24  Primary Care Physician:Paterson, Cris Dollar, MD Date of Appointment: 10/25/2023 Established Patient Visit  Chief complaint:   Chief Complaint  Patient presents with   Follow-up    Post hospital-doing better, no complaints, wears oxygen 2L at night only     HPI: Jeffrey Potter is a 63 y.o. man with past medical history iron  deficiency anemia who presented to the ED on 09/15/23 with hypoxemia, hypotension and shortness of breath from OP visit with GI. Found to have acute pulmonary embolism with acute cor pulmonale. Given Catheter directed thrombolysis on 09/16/23. Discharged on eliquis . Was discharged on [facility].   Interval Updates: Here for follow up. Shortness of breath improving. No side effects to eliquis .  Three weeks prior to hospital stay he had some sudden onset shoulder pain and shortness of breath with night sweats. No leg swelling preceding.   He is up to date on prostrate screening but needs follow up colonoscopy due to polyps detected in feb 2020 colonoscopy. Follow up is tomorrow.   Concerned about nocturnal hypoxemia. SPO2 has been over 90% at home with home monitoring.   Has had issues with vasovagal syncope but this seems to be improving with adjustments in BP medication.   Has had a rash ever since his hospital stay on his back which itches.   I have reviewed the patient's family social and past medical history and updated as appropriate.   Past Medical History:  Diagnosis Date   GERD (gastroesophageal reflux disease)    High cholesterol    High cholesterol    Hypertension     Past Surgical History:  Procedure Laterality Date   BALLOON DILATION N/A 10/10/2017   Procedure: BALLOON DILATION;  Surgeon: Nannette Babe, MD;  Location: [facility];  Service: Gastroenterology;  Laterality: N/A;   ESOPHAGOGASTRODUODENOSCOPY N/A 08/18/2017   Procedure: ESOPHAGOGASTRODUODENOSCOPY (EGD);  Surgeon: Nannette Babe, MD;   Location: [facility];  Service: Gastroenterology;  Laterality: N/A;   ESOPHAGOGASTRODUODENOSCOPY N/A 09/17/2017   Procedure: ESOPHAGOGASTRODUODENOSCOPY (EGD);  Surgeon: Alvis Jourdain, MD;  Location: [facility];  Service: Endoscopy;  Laterality: N/A;   ESOPHAGOGASTRODUODENOSCOPY N/A 10/08/2018   Procedure: ESOPHAGOGASTRODUODENOSCOPY (EGD);  Surgeon: Ace Holder, MD;  Location: [facility];  Service: Gastroenterology;  Laterality: N/A;   ESOPHAGOGASTRODUODENOSCOPY N/A 09/08/2019   Procedure: ESOPHAGOGASTRODUODENOSCOPY (EGD);  Surgeon: Tobin Forts, MD;  Location: [facility];  Service: Endoscopy;  Laterality: N/A;   ESOPHAGOGASTRODUODENOSCOPY (EGD) WITH PROPOFOL  N/A 10/10/2017   Procedure: ESOPHAGOGASTRODUODENOSCOPY (EGD) WITH PROPOFOL ;  Surgeon: Nannette Babe, MD;  Location: [facility];  Service: Gastroenterology;  Laterality: N/A;  with biopsy of gastric polyps   FOREIGN BODY REMOVAL  10/08/2018   Procedure: FOREIGN BODY REMOVAL;  Surgeon: Ace Holder, MD;  Location: [facility];  Service: Gastroenterology;;  Food Impaction   FOREIGN BODY REMOVAL  09/08/2019   Procedure: FOREIGN BODY REMOVAL;  Surgeon: Tobin Forts, MD;  Location: [facility];  Service: Endoscopy;;   IR ANGIOGRAM PULMONARY BILATERAL SELECTIVE  09/16/2023   IR INFUSION THROMBOL ARTERIAL INITIAL (MS)  09/16/2023   IR INFUSION THROMBOL ARTERIAL INITIAL (MS)  09/16/2023   IR THROMB F/U EVAL ART/VEN FINAL DAY (MS)  09/17/2023   IR US  GUIDE VASC ACCESS RIGHT  09/16/2023   IR US  GUIDE VASC ACCESS RIGHT  09/16/2023    Family History  Problem Relation Age of Onset   Cancer Father  Social History   Occupational History   Not on file  Tobacco Use   Smoking status: Never   Smokeless tobacco: Never  Substance and Sexual Activity   Alcohol use: Yes    Comment: occasionally   Drug use: No   Sexual activity: Not on file     Physical Exam: Blood pressure 124/62, pulse 88, temperature 98.4 F (36.9 C),  temperature source Oral, height 5\' 10"  (1.778 m), weight 220 lb 9.6 oz (100.1 kg), SpO2 95%.  Gen:      No acute distress ENT:  no nasal polyps, mucus membranes moist Lungs:    No increased respiratory effort, symmetric chest wall excursion, clear to auscultation bilaterally, no wheezes or crackles CV:         Regular rate and rhythm; no murmurs, rubs, or gallops.  No pedal edema MSK: papular, excoriations, on his back, diffuse, no dermatomal distribution.   Data Reviewed: Imaging: I have personally reviewed the CT Chest PE protocol 09/15/2023 shows bilateral pulmonary emboli with distal left PA clot burden  Echocardiogram 09/16/2023 with RV systolic failure, enlarged PA.  PFTs:   Labs: Lab Results  Component Value Date   WBC 7.4 09/19/2023   HGB 8.9 (L) 09/19/2023   HCT 35.1 (L) 09/19/2023   MCV 70.6 (L) 09/19/2023   PLT 397 09/19/2023   Lab Results  Component Value Date   NA 138 09/19/2023   K 3.6 09/19/2023   CO2 24 09/19/2023   GLUCOSE 156 (H) 09/19/2023   BUN 15 09/19/2023   CREATININE 0.96 09/19/2023   CALCIUM 8.5 (L) 09/19/2023   GFRNONAA >60 09/19/2023    Immunization status:  There is no immunization history on file for this patient.  External Records Personally Reviewed: hospital course  Assessment:  Acute submassive pulmonary embolism with pulmonary infarct April 2025 Acute on Chronic hypoxemic respiratory failure Papular pruritic rash on back  Plan/Recommendations: S/p Catheter directed thrombolysis 09/16/2023 Continue 6 months of anticoagulation full dose with eliquis  5 mg BID with plans to reduce dosing to 2.5 mg BID indefinitively given unprovoked nature of PE.   Get the echocardiogram in July for your heart. Will order an overnight oximetry test to see if you still need oxygen at night.  Follow up with gastroenterology for your colon cancer screening.   Try topical steroid ointment 1% hydrocortisone for the rash on your back. If no improvement follow  up with PCP.  Doubt drug rash given distribution on back only, suspect more contact/irritant dermatitis.   Return to Care: Return in about 5 months (around 03/26/2024).   Louie Rover, MD Pulmonary and Critical Care Medicine [facility] Office:[phone]

## 2023-10-30 ENCOUNTER — Encounter: Payer: Self-pay | Admitting: Gastroenterology

## 2023-10-30 ENCOUNTER — Other Ambulatory Visit (INDEPENDENT_AMBULATORY_CARE_PROVIDER_SITE_OTHER)

## 2023-10-30 ENCOUNTER — Ambulatory Visit: Payer: Self-pay | Admitting: Gastroenterology

## 2023-10-30 VITALS — BP 130/80 | HR 95 | Ht 70.0 in | Wt 224.0 lb

## 2023-10-30 DIAGNOSIS — Z8719 Personal history of other diseases of the digestive system: Secondary | ICD-10-CM

## 2023-10-30 DIAGNOSIS — Z7901 Long term (current) use of anticoagulants: Secondary | ICD-10-CM

## 2023-10-30 DIAGNOSIS — R7989 Other specified abnormal findings of blood chemistry: Secondary | ICD-10-CM

## 2023-10-30 DIAGNOSIS — K449 Diaphragmatic hernia without obstruction or gangrene: Secondary | ICD-10-CM

## 2023-10-30 DIAGNOSIS — Z860100 Personal history of colon polyps, unspecified: Secondary | ICD-10-CM

## 2023-10-30 DIAGNOSIS — D509 Iron deficiency anemia, unspecified: Secondary | ICD-10-CM | POA: Diagnosis not present

## 2023-10-30 DIAGNOSIS — I82220 Acute embolism and thrombosis of inferior vena cava: Secondary | ICD-10-CM | POA: Diagnosis not present

## 2023-10-30 DIAGNOSIS — I2609 Other pulmonary embolism with acute cor pulmonale: Secondary | ICD-10-CM

## 2023-10-30 DIAGNOSIS — E538 Deficiency of other specified B group vitamins: Secondary | ICD-10-CM

## 2023-10-30 LAB — CBC WITH DIFFERENTIAL/PLATELET
Basophils Absolute: 0.1 10*3/uL (ref 0.0–0.1)
Basophils Relative: 1.4 % (ref 0.0–3.0)
Eosinophils Absolute: 0.2 10*3/uL (ref 0.0–0.7)
Eosinophils Relative: 3 % (ref 0.0–5.0)
HCT: 36.6 % — ABNORMAL LOW (ref 39.0–52.0)
Hemoglobin: 11.6 g/dL — ABNORMAL LOW (ref 13.0–17.0)
Lymphocytes Relative: 24.7 % (ref 12.0–46.0)
Lymphs Abs: 1.9 10*3/uL (ref 0.7–4.0)
MCHC: 31.6 g/dL (ref 30.0–36.0)
MCV: 72.4 fl — ABNORMAL LOW (ref 78.0–100.0)
Monocytes Absolute: 0.8 10*3/uL (ref 0.1–1.0)
Monocytes Relative: 9.9 % (ref 3.0–12.0)
Neutro Abs: 4.8 10*3/uL (ref 1.4–7.7)
Neutrophils Relative %: 61 % (ref 43.0–77.0)
Platelets: 351 10*3/uL (ref 150.0–400.0)
RBC: 5.05 Mil/uL (ref 4.22–5.81)
RDW: 29.4 % — ABNORMAL HIGH (ref 11.5–15.5)
WBC: 7.9 10*3/uL (ref 4.0–10.5)

## 2023-10-30 LAB — COMPREHENSIVE METABOLIC PANEL WITH GFR
ALT: 12 U/L (ref 0–53)
AST: 9 U/L (ref 0–37)
Albumin: 3.8 g/dL (ref 3.5–5.2)
Alkaline Phosphatase: 60 U/L (ref 39–117)
BUN: 14 mg/dL (ref 6–23)
CO2: 24 meq/L (ref 19–32)
Calcium: 9.1 mg/dL (ref 8.4–10.5)
Chloride: 107 meq/L (ref 96–112)
Creatinine, Ser: 1.12 mg/dL (ref 0.40–1.50)
GFR: 69.44 mL/min (ref >60.00)
Glucose, Bld: 135 mg/dL — ABNORMAL HIGH (ref 70–99)
Potassium: 4.1 meq/L (ref 3.5–5.1)
Sodium: 140 meq/L (ref 135–145)
Total Bilirubin: 0.4 mg/dL (ref 0.2–1.2)
Total Protein: 7.3 g/dL (ref 6.0–8.3)

## 2023-10-30 NOTE — Patient Instructions (Signed)
 Your provider has requested that you go to the basement level for lab work before leaving today. Press "B" on the elevator. The lab is located at the first door on the left as you exit the elevator.  Due to recent changes in healthcare laws, you may see the results of your imaging and laboratory studies on MyChart before your provider has had a chance to review them.  We understand that in some cases there may be results that are confusing or concerning to you. Not all laboratory results come back in the same time frame and the provider may be waiting for multiple results in order to interpret others.  Please give us  48 hours in order for your provider to thoroughly review all the results before contacting the office for clarification of your results.   Please call us  in August to get an appointment for after October 15th 2025 with Dr Alvester Johnson.  I appreciate the opportunity to care for you. Suzanna Erp, PA

## 2023-10-30 NOTE — Progress Notes (Signed)
 Chief Complaint: IDA Primary GI MD: Dr. Bridgett Camps  HPI:  63 year old male history of S-rings and food impactions, hypertension, and others as listed below presents for evaluation of iron  deficiency anemia.  Upon review of labs November 2023 appears he had a mild anemia with Hgb 11.0, MCV 83.2   seen 09/15/2023 for anemia but at that time while in the office visit patient was having extreme shortness of breath and weakness and on physical exam he was very pale and ill-appearing so he was recommended to proceed to the emergency department for further evaluation  Patient was subsequently admitted and found to have extensive bilateral pulmonary emboli with suspicion for right lower lobe infarction.  He was hypoxic with an elevated troponin and was put on heparin  infusion with PCCM consulted.  Echo revealed severe depression of right ventricular function and catheter thrombolysis was performed.  He was started on Eliquis  4/7   Discussed the use of AI scribe software for clinical note transcription with the patient, who gave verbal consent to proceed.  History of Present Illness He is here for follow-up regarding his anemia and management of anticoagulation therapy following a recent hospitalization for pulmonary embolism. He was previously experiencing severe shortness of breath and was found to have pulmonary embolisms, which led to his hospitalization. He is currently on Eliquis  5 mg twice daily.  Regarding his anemia, he was initially prescribed iron  supplements three times a week by his primary care physician. However, during his hospital stay, the regimen was changed to once daily. There has been some confusion with the prescription refill, as it reverted to three times a week. He has darker stools but no significant abdominal pain, nausea, vomiting, or constipation.  He has a history of a moderate to large hiatal hernia and celiac disease, with previous endoscopic evaluations performed to  monitor these conditions. His last colonoscopy in 2020 showed precancerous polyps, which were removed. There is concern about colon cancer, but recent imaging did not show any masses.  He experienced a near-syncopal episode weeks ago.  PREVIOUS GI WORKUP   EGD 08/2019 inpatient with Dr. Elvin Hammer 1. Acute food impaction status post endoscopic removal  2. GERD with underlying stricture and hiatal hernia  3. Incidental inflammatory appearing polyp( s) , otherwise normal EGD.   EGD inpatient 09/2018 with Dr. General Kenner - Esophagogastric landmarks identified.  - 6 cm hiatal hernia.  - Food at the gastroesophageal junction. Removal was successful.  - Benign- appearing esophageal stenosis. - Tortuous esophagus.  - A few benign appearing gastric polyps.   Colonoscopy 07/2018 - One 7 mm polyp in the ascending colon, removed with a cold snare. Resected and retrieved.  - One 25 mm polyp in the distal ascending colon, removed with a hot snare. Resected and retieved - One 7 mm polyp in the transverse colon, removed with a cold snare. Resected and retrieved.  - One 6 mm polyp in the descending colon, removed with a cold snare. Resected and retrieved.  - One 5 mm polyp in the sigmoid colon, removed with a cold snare. Resected and retrieved.  - One 10 mm polyp in the distal sigmoid colon, removed with a hot snare. Resected and retrieved.  - Internal hemorrhoids. - recall 3 years (07/2021)   Diagnosis 1. Surgical [P], ascending colon, polyp - TUBULAR ADENOMA(S) - NEGATIVE FOR HIGH-GRADE DYSPLASIA OR MALIGNANCY 2. Surgical [P], ascending colon, polyp - TUBULAR ADENOMA - NEGATIVE FOR HIGH-GRADE DYSPLASIA OR MALIGNANCY - LATERAL MUCOSAL MARGINS ARE NEGATIVE FOR DYSPALSIA  3. Surgical [P], transverse, descending, polyp (4) - TUBULAR ADENOMA(S) - TUBULOVILLOUS ADENOMA(S) - NEGATIVE FOR HIGH-GRADE DYSPLASIA OR MALIGNANCY   EGD 09/2017 inpatient with Dr. Bridgett Camps - Low- grade of narrowing Schatzki ring.  Dilated to 15 mm with good result.  - 3 cm hiatal hernia.  - Two 1 cm gastric polyps with adherent heme. Resected and retrieved. Clips were placed.  - Multiple small gastric polyps.  - Nodular gastritis. Biopsied.  - Normal examined duodenum.   EGD 09/2017 inpatient with Dr. Nickey Barn inpatient - Food in the lower third of the esophagus. Removal was successful.  - Moderate Schatzki ring.  - 3 cm hiatal hernia.  - Multiple gastric polyps.  - Normal examined duodenum.  Past Medical History:  Diagnosis Date   Anemia    Gastric polyps    GERD (gastroesophageal reflux disease)    High cholesterol    High cholesterol    Hx of colonic polyps    Hypertension    NSVT (nonsustained ventricular tachycardia) (HCC)    Pulmonary embolism (HCC)     Past Surgical History:  Procedure Laterality Date   BALLOON DILATION N/A 10/10/2017   Procedure: BALLOON DILATION;  Surgeon: Nannette Babe, MD;  Location: [facility];  Service: Gastroenterology;  Laterality: N/A;   ESOPHAGOGASTRODUODENOSCOPY N/A 08/18/2017   Procedure: ESOPHAGOGASTRODUODENOSCOPY (EGD);  Surgeon: Nannette Babe, MD;  Location: [facility];  Service: Gastroenterology;  Laterality: N/A;   ESOPHAGOGASTRODUODENOSCOPY N/A 09/17/2017   Procedure: ESOPHAGOGASTRODUODENOSCOPY (EGD);  Surgeon: Alvis Jourdain, MD;  Location: [facility];  Service: Endoscopy;  Laterality: N/A;   ESOPHAGOGASTRODUODENOSCOPY N/A 10/08/2018   Procedure: ESOPHAGOGASTRODUODENOSCOPY (EGD);  Surgeon: Ace Holder, MD;  Location: [facility];  Service: Gastroenterology;  Laterality: N/A;   ESOPHAGOGASTRODUODENOSCOPY N/A 09/08/2019   Procedure: ESOPHAGOGASTRODUODENOSCOPY (EGD);  Surgeon: Tobin Forts, MD;  Location: [facility];  Service: Endoscopy;  Laterality: N/A;   ESOPHAGOGASTRODUODENOSCOPY (EGD) WITH PROPOFOL  N/A 10/10/2017   Procedure: ESOPHAGOGASTRODUODENOSCOPY (EGD) WITH PROPOFOL ;  Surgeon: Nannette Babe, MD;  Location: [facility];  Service: Gastroenterology;   Laterality: N/A;  with biopsy of gastric polyps   FOREIGN BODY REMOVAL  10/08/2018   Procedure: FOREIGN BODY REMOVAL;  Surgeon: Ace Holder, MD;  Location: [facility];  Service: Gastroenterology;;  Food Impaction   FOREIGN BODY REMOVAL  09/08/2019   Procedure: FOREIGN BODY REMOVAL;  Surgeon: Tobin Forts, MD;  Location: [facility];  Service: Endoscopy;;   IR ANGIOGRAM PULMONARY BILATERAL SELECTIVE  09/16/2023   IR INFUSION THROMBOL ARTERIAL INITIAL (MS)  09/16/2023   IR INFUSION THROMBOL ARTERIAL INITIAL (MS)  09/16/2023   IR THROMB F/U EVAL ART/VEN FINAL DAY (MS)  09/17/2023   IR US  GUIDE VASC ACCESS RIGHT  09/16/2023   IR US  GUIDE VASC ACCESS RIGHT  09/16/2023    Current Outpatient Medications  Medication Sig Dispense Refill   amLODipine  (NORVASC ) 5 MG tablet Take 5 mg by mouth daily.     cyanocobalamin  1000 MCG tablet Take 1 tablet (1,000 mcg total) by mouth daily. 30 tablet 0   ferrous sulfate  325 (65 FE) MG tablet Take 1 tablet (325 mg total) by mouth daily before breakfast. 30 tablet 0   lovastatin (MEVACOR) 20 MG tablet Take 40 mg by mouth in the morning.     Omega-3 Fatty Acids (FISH OIL) 1000 MG CAPS Take 1 capsule by mouth daily.     omeprazole  (PRILOSEC OTC) 20 MG tablet Take 20 mg by mouth in the morning.     Potassium Chloride ER  20 MEQ TBCR Take 1 tablet by mouth daily.     apixaban  (ELIQUIS ) 5 MG TABS tablet Take 2 tablets (10 mg total) by mouth 2 (two) times daily for 6 days, THEN 1 tablet (5 mg total) 2 (two) times daily. 60 tablet 0   No current facility-administered medications for this visit.    Allergies as of 10/30/2023   (No Known Allergies)    Family History  Problem Relation Age of Onset   Cancer Father     Social History   Socioeconomic History   Marital status: Single    Spouse name: Not on file   Number of children: Not on file   Years of education: Not on file   Highest education level: Not on file  Occupational History   Not on file  Tobacco  Use   Smoking status: Never   Smokeless tobacco: Never  Substance and Sexual Activity   Alcohol use: Yes    Comment: occasionally   Drug use: No   Sexual activity: Not on file  Other Topics Concern   Not on file  Social History Narrative   Not on file   Social Drivers of Health   Financial Resource Strain: Not on file  Food Insecurity: No Food Insecurity (09/16/2023)   Hunger Vital Sign    Worried About Running Out of Food in the Last Year: Never true    Ran Out of Food in the Last Year: Never true  Transportation Needs: No Transportation Needs (09/16/2023)   PRAPARE - Administrator, Civil Service (Medical): No    Lack of Transportation (Non-Medical): No  Physical Activity: Not on file  Stress: Not on file  Social Connections: Not on file  Intimate Partner Violence: Not At Risk (09/16/2023)   Humiliation, Afraid, Rape, and Kick questionnaire    Fear of Current or Ex-Partner: No    Emotionally Abused: No    Physically Abused: No    Sexually Abused: No    Review of Systems:    Constitutional: No weight loss, fever, chills, weakness or fatigue HEENT: Eyes: No change in vision               Ears, Nose, Throat:  No change in hearing or congestion Skin: No rash or itching Cardiovascular: No chest pain, chest pressure or palpitations   Respiratory: No SOB or cough Gastrointestinal: See HPI and otherwise negative Genitourinary: No dysuria or change in urinary frequency Neurological: No headache, dizziness or syncope Musculoskeletal: No new muscle or joint pain Hematologic: No bleeding or bruising Psychiatric: No history of depression or anxiety    Physical Exam:  Vital signs: BP 130/80   Pulse 95   Ht 5\' 10"  (1.778 m)   Wt 224 lb (101.6 kg)   BMI 32.14 kg/m   Constitutional: NAD, alert and cooperative Head:  Normocephalic and atraumatic. Eyes:   PEERL, EOMI. No icterus. Conjunctiva pink. Respiratory: Respirations even and unlabored. Lungs clear to  auscultation bilaterally.   No wheezes, crackles, or rhonchi.  Cardiovascular:  Regular rate and rhythm. No peripheral edema, cyanosis or pallor.  Gastrointestinal:  Soft, nondistended, nontender. No rebound or guarding. Normal bowel sounds. No appreciable masses or hepatomegaly. Rectal:  Declines Msk:  Symmetrical without gross deformities. Without edema, no deformity or joint abnormality.  Neurologic:  Alert and  oriented x4;  grossly normal neurologically.  Skin:   Dry and intact without significant lesions or rashes. Psychiatric: Oriented to person, place and time. Demonstrates good judgement and  reason without abnormal affect or behaviors.   RELEVANT LABS AND IMAGING: CBC    Component Value Date/Time   WBC 7.4 09/19/2023 0907   RBC 4.97 09/19/2023 0907   HGB 8.9 (L) 09/19/2023 0907   HCT 35.1 (L) 09/19/2023 0907   PLT 397 09/19/2023 0907   MCV 70.6 (L) 09/19/2023 0907   MCH 17.9 (L) 09/19/2023 0907   MCHC 25.4 (L) 09/19/2023 0907   RDW 26.2 (H) 09/19/2023 0907   LYMPHSABS 1.2 09/19/2023 0907   MONOABS 0.6 09/19/2023 0907   EOSABS 0.1 09/19/2023 0907   BASOSABS 0.0 09/19/2023 0907    CMP     Component Value Date/Time   NA 138 09/19/2023 0907   K 3.6 09/19/2023 0907   CL 105 09/19/2023 0907   CO2 24 09/19/2023 0907   GLUCOSE 156 (H) 09/19/2023 0907   BUN 15 09/19/2023 0907   CREATININE 0.96 09/19/2023 0907   CALCIUM 8.5 (L) 09/19/2023 0907   PROT 7.1 09/18/2023 0119   ALBUMIN 2.3 (L) 09/18/2023 0119   AST 8 (L) 09/18/2023 0119   ALT 12 09/18/2023 0119   ALKPHOS 68 09/18/2023 0119   BILITOT 0.6 09/18/2023 0119   GFRNONAA >60 09/19/2023 1308     Assessment/Plan:   63 year old male history of S-rings and food impactions, iron  deficiency anemia, colonic tubular adenomas, recent acute submassive pulmonary embolism with pulmonary infarct April 2025 s/p catheter directed thrombolysis and initiation of Eliquis  x 6 months with echocardiogram showing RV systolic failure  enlarged PA presenting for iron  deficiency  Iron  deficiency anemia B12 deficiency B12 117 - on B12 injections. Hgb 04/2022 - 11.0, 09/2023 7.7 with ferritin 31, iron  12, saturation 3% On iron  supplements daily Colonoscopy 2020 with 5 tubular adenomas and 1 tubulovillous adenoma (25 mm) with EGD March 2021 done for food impaction History of large hiatal hernia per recent imaging likely contributing to IDA but he does have significant polyp history. No GI symptoms currently, no overt bleeding. complicated by his recent admission for bilateral pulmonary emboli and having to be placed on Eliquis  1 month ago.  Pulmonary would like to continue uninterrupted anticoagulation for 6 months given unprovoked nature of PE (and continue eliquis  indefinitely after 6 months) - Continue to monitor - CBC, CMP, IBC/ferritin - Continue oral iron  supplement - Follow-up October 2025 (6 months out from PE) for consideration of endoscopic evaluation if pulmonology agrees to hold anticoagulation and would likely be a hospital procedure for extra precaution. -- if overt bleeding or worsening anemia, please let us  know.  Acute PE with acute cor pulmonale  Bilateral pulmonary emboli concerning for submassive pulmonary embolism with elevated troponin. admission April 2025 on Eliquis .  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  Timing TBD.  Will need to be on Eliquis  x 6 months uninterrupted - Follow-up October 2025   History of esophageal stricture Hiatal hernia No dysphagia at this time.  S/p 4 EGDs for food impactions with most recent 2021 He will need repeat EGD but will need to hold off in the setting of recent PE with uninterrupted anticoagulation for 6 months - Follow-up  October 2025 - Continue PPI twice daily    This visit required 39 minutes of patient care (this includes precharting, chart review, review of results, face-to-face  time used for counseling as well as treatment plan and follow-up. The patient was provided an opportunity to ask questions and all were answered. The patient agreed with the plan and demonstrated an understanding of the instructions.    [facility] 10/30/2023, 3:50 PM  Cc: Bertha Broad, MD

## 2023-10-31 LAB — IBC + FERRITIN
Ferritin: 9 ng/mL — ABNORMAL LOW (ref 22.0–322.0)
Iron: 71 ug/dL (ref 42–165)
Saturation Ratios: 15.3 % — ABNORMAL LOW (ref 20.0–50.0)
TIBC: 463.4 ug/dL — ABNORMAL HIGH (ref 250.0–450.0)
Transferrin: 331 mg/dL (ref 212.0–360.0)

## 2023-10-31 LAB — B12 AND FOLATE PANEL
Folate: 6.9 ng/mL (ref >5.9)
Vitamin B-12: 414 pg/mL (ref 211–911)

## 2023-11-01 ENCOUNTER — Ambulatory Visit: Payer: Self-pay | Admitting: Gastroenterology

## 2023-11-02 NOTE — Progress Notes (Signed)
 Addendum: Reviewed and agree with assessment and management plan. Difficult situation given massive PE and need for anticoagulation Definitely needs upper and lower endoscopy but timing is to be determined based on when anticoagulation can be interrupted.  Given his history of polyps he very likely would need polypectomy and thus holding of anticoagulation Agree with the twice daily PPI Close attention to iron  studies, IV iron  if needed and blood counts until endoscopic evaluation can be performed Obviously if any overt bleeding he should go to the ER urgently/emergently Ariyan Sinnett, Amber Bail, MD

## 2023-11-28 ENCOUNTER — Telehealth (HOSPITAL_BASED_OUTPATIENT_CLINIC_OR_DEPARTMENT_OTHER): Payer: Self-pay

## 2023-11-28 NOTE — Telephone Encounter (Signed)
 Copied from CRM [id number]. Topic: Clinical - Lab/Test Results >> Nov 27, 2023  1:39 PM Crist Dominion wrote: Reason for CRM: Patient is requesting Dr. Dione Franks or nurse to please contact him when his over night testing results from [facility] are in.

## 2023-12-18 ENCOUNTER — Ambulatory Visit (HOSPITAL_COMMUNITY)
Admission: RE | Admit: 2023-12-18 | Discharge: 2023-12-18 | Disposition: A | Discharge status: Home / Self Care | Source: Ambulatory Visit | Attending: Internal Medicine | Admitting: Internal Medicine

## 2023-12-18 DIAGNOSIS — I2602 Saddle embolus of pulmonary artery with acute cor pulmonale: Secondary | ICD-10-CM

## 2023-12-18 DIAGNOSIS — R0602 Shortness of breath: Secondary | ICD-10-CM

## 2023-12-18 LAB — ECHOCARDIOGRAM COMPLETE
AR max vel: 2.56 cm2
AV Area VTI: 2.57 cm2
AV Area mean vel: 2.54 cm2
AV Mean grad: 4 mmHg
AV Peak grad: 7.4 mmHg
Ao pk vel: 1.36 m/s
Area-P 1/2: 2.78 cm2
S' Lateral: 3 cm

## 2023-12-19 ENCOUNTER — Ambulatory Visit: Payer: Self-pay | Admitting: Internal Medicine

## 2023-12-25 ENCOUNTER — Telehealth: Payer: Self-pay

## 2023-12-25 NOTE — Telephone Encounter (Signed)
 Patient mom is calling about patients oxygen test results . Patient mom says no one has contacted them about the results . Please reach out to patient mom about the results to see if he can get off the oxygen [phone]

## 2023-12-25 NOTE — Telephone Encounter (Signed)
 Copied from CRM [id number]. Topic: Clinical - Lab/Test Results >> Dec 25, 2023  4:19 PM Isabell A wrote: Reason for CRM: Patients mother Illya Gienger is calling to go over oxygen results.   Callback number: [phone]  I called and spoke to pt. Pt states he had an ONO done last week and he was wanting results. I informed pt that these results take up to 2 weeks to come in and we could call him with results once we have them. Pt verbalized understanding. NFN

## 2023-12-26 NOTE — Telephone Encounter (Signed)
Duplicate encounter please see other.

## 2024-01-05 ENCOUNTER — Other Ambulatory Visit: Payer: Self-pay

## 2024-01-05 NOTE — Telephone Encounter (Unsigned)
 Copied from CRM [id number]. Topic: Clinical - Medical Advice >> Jan 03, 2024 11:26 AM Leila C wrote: Reason for CRM: Patient's sister Landry [phone] wants patient's echocardiogram results and patient is still on oxygen at night, the previous test results was inconclusive. Landry is asking if patient can have the oxygen night test in a facility for an accurate results. Patient is currently at his mother house for access to oxygen machine. Also, wants to discuss patient being off the oxygen machine. Please advise and call back.

## 2024-01-08 NOTE — Telephone Encounter (Signed)
 Jackson Avelina Carolee Aloysius LENORA Joylene Carlean Jackson Avelina; Tucker, Dolanda; Sheree, Merilee This is the note left on the sales order. Spoke to the pt mom on 7/17 who stated she will speak to the pt Dr. and cb, because the pt had already been tested 2x and she believed he should not have to test again. Pt results were not enough to process any data on the 2 previous times he was tested

## 2024-01-08 NOTE — Telephone Encounter (Signed)
 Copied from CRM [id number]. Topic: Clinical - Medical Advice >> Jan 05, 2024  2:12 PM Celestine FALCON wrote: Reason for CRM:  Patient's sister Landry [phone] stated that the patient is still on oxygen at night, and has taken multiple overnight oxygen oximetry tests, but the test results was inconclusive. Landry is asking if patient can have the oxygen night test in a facility for an accurate results. Patient is currently at his mother house for access to oxygen machine, and he would like to be cleared from using it at night so he can go home. Pt has been on oxygen at night since May 2025. Please advise and call back. This message may be a duplicate, but there was no response from the clinic from the previous request.  Routing to [facility] do we have the results back from 5/14 overnight pulse oximetry?

## 2024-01-09 ENCOUNTER — Telehealth: Payer: Self-pay | Admitting: Internal Medicine

## 2024-01-09 NOTE — Telephone Encounter (Signed)
 I routed to you as FYI that the pt is refusing ONO. It looks like the previous ones did not have enough information and when pt contacted about repeating he indicated should not have to test again.

## 2024-01-09 NOTE — Telephone Encounter (Signed)
 Ok, I will CC: Dr. Meade who ordered

## 2024-01-09 NOTE — Telephone Encounter (Signed)
 Returned patients call and discussed EKG results as well as previous ONOs. Pt wants to know if he should continue with ONOs until he gets results (states he has had 2 previously without results). Patient also wants to know if he should continue O2 use at night (currently on 2L and states he is tolerating it well). Please advise. Thank you.

## 2024-01-09 NOTE — Telephone Encounter (Signed)
 Why was this sent to me, I have not seen patient? Can I help with something

## 2024-01-09 NOTE — Telephone Encounter (Signed)
 Copied from CRM [id number]. Topic: General - Other >> Jan 09, 2024  2:25 PM Joesph PARAS wrote: Reason for CRM: Patient is requesting a call back from [place]. (?)

## 2024-01-09 NOTE — Telephone Encounter (Signed)
 Copied from CRM [id number]. Topic: Clinical - Lab/Test Results >> Jan 05, 2024  2:10 PM Dustin F wrote: Reason for CRM: Pt's sister Evelyn Moch who is on the [facility] is calling for the results of the pt's echocardiogram completed on 7/7. I did see the results were released to the patient, but the pt does not use the Mychart. [name] would like a phone call from the clinical staff to review the results at phone number [phone].  ATC and left detailed voice message regarding EKG results (Normal RV after PE).

## 2024-01-10 NOTE — Telephone Encounter (Signed)
 Copied from CRM [id number]. Topic: Clinical - Medication Question >> Jan 10, 2024  3:08 PM Rozanna G wrote: Reason for CRM: PT RETURNING CALL ABOUT OXYGEN AND OV FOLLOW UP . ADVISED HIM PROVIDER [facility] NOT AVAILABLE FOR NEXT YEAR. HE WANTS TO KNOW WHEN HIS NEXT OXYGEN TEST WILL BE. THANKS   Returned pt call and discussed continuing oxygen use as he is tolerating it for now, to be addressed at follow up visit per Dr. Meade. No current need to schedule another ONO per 01/10/24 note. Schedule pt for follow up 10/14 2pm. Pt verbalized understanding. NFN.

## 2024-01-10 NOTE — Telephone Encounter (Signed)
 ATC and left detailed VM to continue oxygen, as well as to call office to be scheduled for next OV.

## 2024-01-10 NOTE — Telephone Encounter (Signed)
 I ordered an ONO on room air one time to see if he should discontinue home oxygen after his hospitalization. I have not been made aware of any results yet. If there is a malfunction with the test then it is from [facility]. Fine to continue oxygen as he is tolerating for now, and we can address at a follow up visit.

## 2024-01-15 ENCOUNTER — Telehealth: Payer: Self-pay | Admitting: *Deleted

## 2024-01-15 NOTE — Telephone Encounter (Signed)
 Copied from CRM [id number]. Topic: Clinical - Order For Equipment >> Dec 26, 2023  1:37 PM Jeffrey Potter wrote: Reason for CRM: Patient's mother is requesting a temporary oxygen tank for patient so that he can take that and use it to go to his own home and sleep at night until he can pass a test to come off the oxygen test.  Duplicate.  See note from 01/09/24.  NFN.

## 2024-03-26 ENCOUNTER — Encounter: Payer: Self-pay | Admitting: Internal Medicine

## 2024-03-26 ENCOUNTER — Ambulatory Visit: Admitting: Internal Medicine

## 2024-03-26 VITALS — BP 134/94 | HR 84 | Temp 98.1°F | Ht 70.0 in | Wt 236.0 lb

## 2024-03-26 DIAGNOSIS — R0683 Snoring: Secondary | ICD-10-CM | POA: Diagnosis not present

## 2024-03-26 DIAGNOSIS — I2602 Saddle embolus of pulmonary artery with acute cor pulmonale: Secondary | ICD-10-CM | POA: Diagnosis not present

## 2024-03-26 MED ORDER — APIXABAN 2.5 MG PO TABS: 2.5000 mg | ORAL_TABLET | Freq: Two times a day (BID) | ORAL | 3 refills | Status: AC

## 2024-03-26 NOTE — Progress Notes (Signed)
 Jeffrey Potter    [id number]    03-13-1961  Primary Care Physician:Jeffrey Potter Date of Appointment: 03/26/2024 Established Patient Visit  Chief complaint:   Chief Complaint  Patient presents with   Follow-up    Patient states his breathing is good      HPI: Jeffrey Potter is a 63 y.o. man with past medical history iron  deficiency anemia who presented to the ED on 09/15/23 with hypoxemia, hypotension and shortness of breath from OP visit with GI. Found to have acute pulmonary embolism with acute cor pulmonale. Given Catheter directed thrombolysis on 09/16/23. Discharged on eliquis . Was discharged on [facility].   Interval Updates: Here for follow up for PE.   He says they were unable to obtain the ONO despite multiple attempts.   Walking everyday 30 minutes. Has returned to his normal exercise   Denies sleepiness but does snore.   No bleeding complications.   Epworth Sleepiness Scale  Chance of dozing off while sitting and reading? 0  1  2  3   4   2.   Chance of dozing off while watching TV?  0  1  2  3  4   3.   Chance of dozing off while Sitting, inactive in a public place?  0  1  2  3   4   4.   Chance of dozing off as a passenger in a car for an hour without a break?  0  1  2  3   4   5.   Chance of dozing off while lying down to rest in the afternoon when circumstances permit?  0  1  2  3   4   6.   Chance of dozing off while sitting and talking to someone?  0  1  2  3   4   7.   Chance of dozing off while sitting quietly after lunch without alcohol?  0  1  2  3   4   8.   Chance of dozing off while in a car, while stopped for a few minutes in traffic? 0  1  2  3   4     Total ESS 9/24   I have reviewed the patient's family social and past medical history and updated as appropriate.   Past Medical History:  Diagnosis Date   Anemia    Gastric polyps    GERD (gastroesophageal reflux disease)    High cholesterol    High cholesterol    Hx of colonic polyps     Hypertension    NSVT (nonsustained ventricular tachycardia) (HCC)    Pulmonary embolism (HCC)     Past Surgical History:  Procedure Laterality Date   BALLOON DILATION N/A 10/10/2017   Procedure: BALLOON DILATION;  Surgeon: Albertus Gordy HERO, Potter;  Location: [facility];  Service: Gastroenterology;  Laterality: N/A;   ESOPHAGOGASTRODUODENOSCOPY N/A 08/18/2017   Procedure: ESOPHAGOGASTRODUODENOSCOPY (EGD);  Surgeon: Albertus Gordy HERO, Potter;  Location: [facility];  Service: Gastroenterology;  Laterality: N/A;   ESOPHAGOGASTRODUODENOSCOPY N/A 09/17/2017   Procedure: ESOPHAGOGASTRODUODENOSCOPY (EGD);  Surgeon: Rollin Dover, Potter;  Location: [facility];  Service: Endoscopy;  Laterality: N/A;   ESOPHAGOGASTRODUODENOSCOPY N/A 10/08/2018   Procedure: ESOPHAGOGASTRODUODENOSCOPY (EGD);  Surgeon: Leigh Elspeth SQUIBB, Potter;  Location: [facility];  Service: Gastroenterology;  Laterality: N/A;   ESOPHAGOGASTRODUODENOSCOPY N/A 09/08/2019   Procedure: ESOPHAGOGASTRODUODENOSCOPY (EGD);  Surgeon: Abran Norleen SAILOR, Potter;  Location: [facility];  Service: Endoscopy;  Laterality: N/A;   ESOPHAGOGASTRODUODENOSCOPY (EGD) WITH PROPOFOL  N/A 10/10/2017   Procedure: ESOPHAGOGASTRODUODENOSCOPY (EGD) WITH PROPOFOL ;  Surgeon: Albertus Gordy HERO, Potter;  Location: [facility];  Service: Gastroenterology;  Laterality: N/A;  with biopsy of gastric polyps   FOREIGN BODY REMOVAL  10/08/2018   Procedure: FOREIGN BODY REMOVAL;  Surgeon: Leigh Elspeth SQUIBB, Potter;  [facility];  Service: Gastroenterology;;  Food Impaction   FOREIGN BODY REMOVAL  09/08/2019   Procedure: FOREIGN BODY REMOVAL;  Surgeon: Abran Norleen SAILOR, Potter;  Location: [facility];  Service: Endoscopy;;   IR ANGIOGRAM PULMONARY BILATERAL SELECTIVE  09/16/2023   IR INFUSION THROMBOL ARTERIAL INITIAL (MS)  09/16/2023   IR INFUSION THROMBOL ARTERIAL INITIAL (MS)  09/16/2023   IR THROMB F/U EVAL ART/VEN FINAL DAY (MS)  09/17/2023   IR US  GUIDE VASC ACCESS RIGHT  09/16/2023   IR US  GUIDE VASC  ACCESS RIGHT  09/16/2023    Family History  Problem Relation Age of Onset   Cancer Father     Social History   Occupational History   Not on file  Tobacco Use   Smoking status: Never   Smokeless tobacco: Never  Substance and Sexual Activity   Alcohol use: Yes    Comment: occasionally   Drug use: No   Sexual activity: Not on file     Physical Exam: Blood pressure (!) 134/94, pulse 84, temperature 98.1 F (36.7 C), temperature source Oral, height 5' 10 (1.778 m), weight 236 lb (107 kg), SpO2 94%.  Gen:      No acute distress, fatigued  ENT:  room air mmm Lungs:    ctab no wheezes or crackles CV:         RRR no mrg, no edema  Data Reviewed: Imaging: I have personally reviewed the CT Chest PE protocol 09/15/2023 shows bilateral pulmonary emboli with distal left PA clot burden  Echocardiogram 09/16/2023 with RV systolic failure, enlarged PA. July 2025 echocardiogram shows normalized RV function.    PFTs:   Labs: Lab Results  Component Value Date   WBC 7.9 10/30/2023   HGB 11.6 (L) 10/30/2023   HCT 36.6 (L) 10/30/2023   MCV 72.4 (L) 10/30/2023   PLT 351.0 10/30/2023   Lab Results  Component Value Date   NA 140 10/30/2023   K 4.1 10/30/2023   CO2 24 10/30/2023   GLUCOSE 135 (H) 10/30/2023   BUN 14 10/30/2023   CREATININE 1.12 10/30/2023   CALCIUM 9.1 10/30/2023   GFR 69.44 10/30/2023   GFRNONAA >60 09/19/2023    Immunization status: Immunization History  Administered Date(s) Administered   Fluzone Influenza virus vaccine,trivalent (IIV3), split virus 01/30/2012   Pneumococcal Polysaccharide-23 01/30/2012   Tdap 01/21/2008    External Records Personally Reviewed: hospital course  Assessment:  Acute submassive pulmonary embolism with pulmonary infarct April 2025 S/p Catheter directed thrombolysis 09/16/2023 Acute hypoxemic respiratory failure  Plan/Recommendations:   Echocardiogram of the heart looks good.  We will discontinue the home oxygen.   Unable to obtain ONO on room air through [employer] despite multiple attempts by patient. Discussed sleep study but he feels he sleep fine.   Let me know if you want to consider a sleep study.  Decrease the eliquis  to 2.5 mg twice daily, recommend lifelong therapy since unprovoked VTE.    Return to Care: No follow-ups on file.   Verdon Gore, Potter Pulmonary [facility] Office:[phone]

## 2024-03-26 NOTE — Addendum Note (Signed)
 Addended by: MOODY RANCHER on: 03/26/2024 02:52 PM   Modules accepted: Orders

## 2024-03-26 NOTE — Patient Instructions (Signed)
 It was a pleasure to see you today!  Please schedule follow up with APP in 1 year.  If my schedule is not open yet, we will contact you with a reminder closer to that time. Please call [phone] if you haven't heard from us  a month before, and always call us  sooner if issues or concerns arise. You can also send us  a message through MyChart, but but aware that this is not to be used for urgent issues and it may take up to 5-7 days to receive a reply. Please be aware that you will likely be able to view your results before I have a chance to respond to them. Please give us  5 business days to respond to any non-urgent results.    Echocardiogram of the heart looks good. We will discontinue the home oxygen.  Let me know if you want to consider a sleep study.  Decrease the eliquis  to 2.5 mg twice daily.

## 2024-03-29 ENCOUNTER — Encounter: Payer: Self-pay | Admitting: Gastroenterology

## 2024-03-29 ENCOUNTER — Other Ambulatory Visit (INDEPENDENT_AMBULATORY_CARE_PROVIDER_SITE_OTHER)

## 2024-03-29 ENCOUNTER — Telehealth: Payer: Self-pay

## 2024-03-29 ENCOUNTER — Ambulatory Visit (INDEPENDENT_AMBULATORY_CARE_PROVIDER_SITE_OTHER): Admitting: Gastroenterology

## 2024-03-29 VITALS — BP 124/70 | HR 84 | Ht 70.0 in | Wt 237.0 lb

## 2024-03-29 DIAGNOSIS — K222 Esophageal obstruction: Secondary | ICD-10-CM

## 2024-03-29 DIAGNOSIS — I2609 Other pulmonary embolism with acute cor pulmonale: Secondary | ICD-10-CM

## 2024-03-29 DIAGNOSIS — D649 Anemia, unspecified: Secondary | ICD-10-CM

## 2024-03-29 DIAGNOSIS — I82220 Acute embolism and thrombosis of inferior vena cava: Secondary | ICD-10-CM

## 2024-03-29 DIAGNOSIS — D509 Iron deficiency anemia, unspecified: Secondary | ICD-10-CM

## 2024-03-29 DIAGNOSIS — Z860100 Personal history of colon polyps, unspecified: Secondary | ICD-10-CM

## 2024-03-29 DIAGNOSIS — K449 Diaphragmatic hernia without obstruction or gangrene: Secondary | ICD-10-CM

## 2024-03-29 LAB — COMPREHENSIVE METABOLIC PANEL WITH GFR
ALT: 12 U/L (ref 0–53)
AST: 10 U/L (ref 0–37)
Albumin: 4.1 g/dL (ref 3.5–5.2)
Alkaline Phosphatase: 57 U/L (ref 39–117)
BUN: 13 mg/dL (ref 6–23)
CO2: 24 meq/L (ref 19–32)
Calcium: 8.9 mg/dL (ref 8.4–10.5)
Chloride: 106 meq/L (ref 96–112)
Creatinine, Ser: 1.03 mg/dL (ref 0.40–1.50)
GFR: 76.56 mL/min (ref >60.00)
Glucose, Bld: 99 mg/dL (ref 70–99)
Potassium: 4.1 meq/L (ref 3.5–5.1)
Sodium: 141 meq/L (ref 135–145)
Total Bilirubin: 0.5 mg/dL (ref 0.2–1.2)
Total Protein: 7.5 g/dL (ref 6.0–8.3)

## 2024-03-29 LAB — CBC WITH DIFFERENTIAL/PLATELET
Basophils Absolute: 0.1 K/uL (ref 0.0–0.1)
Basophils Relative: 1.1 % (ref 0.0–3.0)
Eosinophils Absolute: 0.1 K/uL (ref 0.0–0.7)
Eosinophils Relative: 1.7 % (ref 0.0–5.0)
HCT: 46.8 % (ref 39.0–52.0)
Hemoglobin: 15.8 g/dL (ref 13.0–17.0)
Lymphocytes Relative: 25.3 % (ref 12.0–46.0)
Lymphs Abs: 1.6 K/uL (ref 0.7–4.0)
MCHC: 33.8 g/dL (ref 30.0–36.0)
MCV: 88.8 fl (ref 78.0–100.0)
Monocytes Absolute: 0.5 K/uL (ref 0.1–1.0)
Monocytes Relative: 8 % (ref 3.0–12.0)
Neutro Abs: 4 K/uL (ref 1.4–7.7)
Neutrophils Relative %: 63.9 % (ref 43.0–77.0)
Platelets: 243 K/uL (ref 150.0–400.0)
RBC: 5.28 Mil/uL (ref 4.22–5.81)
RDW: 16.1 % — ABNORMAL HIGH (ref 11.5–15.5)
WBC: 6.2 K/uL (ref 4.0–10.5)

## 2024-03-29 LAB — IBC + FERRITIN
Ferritin: 15.2 ng/mL — ABNORMAL LOW (ref 22.0–322.0)
Iron: 243 ug/dL — ABNORMAL HIGH (ref 42–165)
Saturation Ratios: 58.2 % — ABNORMAL HIGH (ref 20.0–50.0)
TIBC: 417.2 ug/dL (ref 250.0–450.0)
Transferrin: 298 mg/dL (ref 212.0–360.0)

## 2024-03-29 NOTE — Patient Instructions (Signed)
 Your provider has requested that you go to the basement level for lab work before leaving today. Press B on the elevator. The lab is located at the first door on the left as you exit the elevator.  _______________________________________________________  If your blood pressure at your visit was 140/90 or greater, please contact your primary care physician to follow up on this.  _______________________________________________________  If you are age 65 or older, your body mass index should be between 23-30. Your Body mass index is 34.01 kg/m. If this is out of the aforementioned range listed, please consider follow up with your Primary Care Provider.  If you are age 37 or younger, your body mass index should be between 19-25. Your Body mass index is 34.01 kg/m. If this is out of the aformentioned range listed, please consider follow up with your Primary Care Provider.   ________________________________________________________  The [facility] providers would like to encourage you to use MYCHART to communicate with providers for non-urgent requests or questions.  Due to long hold times on the telephone, sending your provider a message by [facility] may be a faster and more efficient way to get a response.  Please allow 48 business hours for a response.  Please remember that this is for non-urgent requests.  _______________________________________________________  [facility] is using a team-based approach to care.  Your team is made up of your doctor and two to three APPS. Our APPS (Nurse Practitioners and Physician Assistants) work with your physician to ensure care continuity for you. They are fully qualified to address your health concerns and develop a treatment plan. They communicate directly with your gastroenterologist to care for you. Seeing the Advanced Practice Practitioners on your physician's team can help you by facilitating care more promptly, often allowing for earlier  appointments, access to diagnostic testing, procedures, and other specialty referrals.

## 2024-03-29 NOTE — Telephone Encounter (Signed)
 Ok to interrupt eliquis  for elective procedures at this time.

## 2024-03-29 NOTE — Telephone Encounter (Signed)
   [facility] [address] [place], [place]  [address] Phone:  [phone]   Fax:  [phone]    Jeffrey Potter 13-May-1961 [id number]  03/29/2024   Dear Dr. Meade:  We would like to  schedule the above named patient for a(n) endoscopic procedure. Our records show that (s)he is on anticoagulation therapy.  Please advise as to whether the patient would be safe to have a procedure from a pulmonary perspective.  Please route your response to [facility] or fax response to [phone].  Sincerely,    [facility]

## 2024-03-31 NOTE — Progress Notes (Signed)
 Chief Complaint: Follow-up of iron  deficiency anemia Primary GI MD: Dr. Albertus  HPI: 63 year old male history of S-rings and food impactions, hypertension, and others as listed below presents for evaluation of iron  deficiency anemia.   seen 09/15/2023 for anemia but at that time while in the office visit patient was having extreme shortness of breath and weakness and on physical exam he was very pale and ill-appearing so he was recommended to proceed to the emergency department for further evaluation   Patient was subsequently admitted and found to have extensive bilateral pulmonary emboli with suspicion for right lower lobe infarction.  He was hypoxic with an elevated troponin and was put on heparin  infusion with PCCM consulted.  Echo revealed severe depression of right ventricular function and catheter thrombolysis was performed.  He was started on Eliquis  4/7   patient was seen 10/30/2023 and at that time decision was made to hold off on EGD/colonoscopy as he was unable to come off of his anticoagulation.  His hemoglobin and iron  studies were stable so it was recommended to follow-up 6 months from onset of massive PE to decide if he can come off his anticoagulation for procedures  Recently seen by pulmonology in which he was taken off home oxygen and his Eliquis  was decreased.  Though he was recommended to continue Eliquis  indefinitely.  Patient denies overt bleeding.  Denies GI symptoms.  Overall doing well and is here today with his sister    PREVIOUS GI WORKUP   EGD 08/2019 inpatient with Dr. Abran 1. Acute food impaction status post endoscopic removal  2. GERD with underlying stricture and hiatal hernia  3. Incidental inflammatory appearing polyp( s) , otherwise normal EGD.   EGD inpatient 09/2018 with Dr. Leigh - Esophagogastric landmarks identified.  - 6 cm hiatal hernia.  - Food at the gastroesophageal junction. Removal was successful.  - Benign- appearing esophageal  stenosis. - Tortuous esophagus.  - A few benign appearing gastric polyps.   Colonoscopy 07/2018 - One 7 mm polyp in the ascending colon, removed with a cold snare. Resected and retrieved.  - One 25 mm polyp in the distal ascending colon, removed with a hot snare. Resected and retieved - One 7 mm polyp in the transverse colon, removed with a cold snare. Resected and retrieved.  - One 6 mm polyp in the descending colon, removed with a cold snare. Resected and retrieved.  - One 5 mm polyp in the sigmoid colon, removed with a cold snare. Resected and retrieved.  - One 10 mm polyp in the distal sigmoid colon, removed with a hot snare. Resected and retrieved.  - Internal hemorrhoids. - recall 3 years (07/2021)   Diagnosis 1. Surgical [P], ascending colon, polyp - TUBULAR ADENOMA(S) - NEGATIVE FOR HIGH-GRADE DYSPLASIA OR MALIGNANCY 2. Surgical [P], ascending colon, polyp - TUBULAR ADENOMA - NEGATIVE FOR HIGH-GRADE DYSPLASIA OR MALIGNANCY - LATERAL MUCOSAL MARGINS ARE NEGATIVE FOR DYSPALSIA 3. Surgical [P], transverse, descending, polyp (4) - TUBULAR ADENOMA(S) - TUBULOVILLOUS ADENOMA(S) - NEGATIVE FOR HIGH-GRADE DYSPLASIA OR MALIGNANCY   EGD 09/2017 inpatient with Dr. Albertus - Low- grade of narrowing Schatzki ring. Dilated to 15 mm with good result.  - 3 cm hiatal hernia.  - Two 1 cm gastric polyps with adherent heme. Resected and retrieved. Clips were placed.  - Multiple small gastric polyps.  - Nodular gastritis. Biopsied.  - Normal examined duodenum.   EGD 09/2017 inpatient with Dr. Rollin inpatient - Food in the lower third of the esophagus. Removal  was successful.  - Moderate Schatzki ring.  - 3 cm hiatal hernia.  - Multiple gastric polyps.  - Normal examined duodenum  Past Medical History:  Diagnosis Date   Anemia    Gastric polyps    GERD (gastroesophageal reflux disease)    High cholesterol    High cholesterol    Hx of colonic polyps    Hypertension    NSVT (nonsustained  ventricular tachycardia) (HCC)    Pulmonary embolism (HCC)     Past Surgical History:  Procedure Laterality Date   BALLOON DILATION N/A 10/10/2017   Procedure: BALLOON DILATION;  Surgeon: Albertus Gordy HERO, MD;  Location: [facility];  Service: Gastroenterology;  Laterality: N/A;   ESOPHAGOGASTRODUODENOSCOPY N/A 08/18/2017   Procedure: ESOPHAGOGASTRODUODENOSCOPY (EGD);  Surgeon: Albertus Gordy HERO, MD;  Location: [facility];  Service: Gastroenterology;  Laterality: N/A;   ESOPHAGOGASTRODUODENOSCOPY N/A 09/17/2017   Procedure: ESOPHAGOGASTRODUODENOSCOPY (EGD);  Surgeon: Rollin Dover, MD;  Location: [facility];  Service: Endoscopy;  Laterality: N/A;   ESOPHAGOGASTRODUODENOSCOPY N/A 10/08/2018   Procedure: ESOPHAGOGASTRODUODENOSCOPY (EGD);  Surgeon: Leigh Elspeth SQUIBB, MD;  Location: [facility];  Service: Gastroenterology;  Laterality: N/A;   ESOPHAGOGASTRODUODENOSCOPY N/A 09/08/2019   Procedure: ESOPHAGOGASTRODUODENOSCOPY (EGD);  Surgeon: Abran Norleen SAILOR, MD;  Location: [facility];  Service: Endoscopy;  Laterality: N/A;   ESOPHAGOGASTRODUODENOSCOPY (EGD) WITH PROPOFOL  N/A 10/10/2017   Procedure: ESOPHAGOGASTRODUODENOSCOPY (EGD) WITH PROPOFOL ;  Surgeon: Albertus Gordy HERO, MD;  Location: [facility];  Service: Gastroenterology;  Laterality: N/A;  with biopsy of gastric polyps   FOREIGN BODY REMOVAL  10/08/2018   Procedure: FOREIGN BODY REMOVAL;  Surgeon: Leigh Elspeth SQUIBB, MD;  Location: [facility];  Service: Gastroenterology;;  Food Impaction   FOREIGN BODY REMOVAL  09/08/2019   Procedure: FOREIGN BODY REMOVAL;  Surgeon: Abran Norleen SAILOR, MD;  Location: [facility];  Service: Endoscopy;;   IR ANGIOGRAM PULMONARY BILATERAL SELECTIVE  09/16/2023   IR INFUSION THROMBOL ARTERIAL INITIAL (MS)  09/16/2023   IR INFUSION THROMBOL ARTERIAL INITIAL (MS)  09/16/2023   IR THROMB F/U EVAL ART/VEN FINAL DAY (MS)  09/17/2023   IR US  GUIDE VASC ACCESS RIGHT  09/16/2023   IR US  GUIDE VASC ACCESS RIGHT  09/16/2023    Current  Outpatient Medications  Medication Sig Dispense Refill   amLODipine  (NORVASC ) 5 MG tablet Take 5 mg by mouth daily.     apixaban  (ELIQUIS ) 2.5 MG TABS tablet Take 1 tablet (2.5 mg total) by mouth 2 (two) times daily. 180 tablet 3   cyanocobalamin  1000 MCG tablet Take 1 tablet (1,000 mcg total) by mouth daily. 30 tablet 0   Ferrous Sulfate  Dried (HIGH POTENCY IRON ) 65 MG TABS Take 1 tablet by mouth daily.     lovastatin (MEVACOR) 20 MG tablet Take 40 mg by mouth in the morning.     Omega-3 Fatty Acids (FISH OIL) 1000 MG CAPS Take 1 capsule by mouth daily.     omeprazole  (PRILOSEC OTC) 20 MG tablet Take 20 mg by mouth in the morning.     Potassium Chloride ER 20 MEQ TBCR Take 1 tablet by mouth daily.     No current facility-administered medications for this visit.    Allergies as of 03/29/2024   (No Known Allergies)    Family History  Problem Relation Age of Onset   Lung cancer Father        non smoker    Social History   Socioeconomic History   Marital status: Single    Spouse name: Not on file  Number of children: 0   Years of education: Not on file   Highest education level: Not on file  Occupational History   Not on file  Tobacco Use   Smoking status: Never   Smokeless tobacco: Never  Vaping Use   Vaping status: Never Used  Substance and Sexual Activity   Alcohol use: Yes    Comment: occasionally   Drug use: No   Sexual activity: Not on file  Other Topics Concern   Not on file  Social History Narrative   Not on file   Social Drivers of Health   Financial Resource Strain: Not on file  Food Insecurity: No Food Insecurity (09/16/2023)   Hunger Vital Sign    Worried About Running Out of Food in the Last Year: Never true    Ran Out of Food in the Last Year: Never true  Transportation Needs: No Transportation Needs (09/16/2023)   PRAPARE - Administrator, Civil Service (Medical): No    Lack of Transportation (Non-Medical): No  Physical Activity: Not on  file  Stress: Not on file  Social Connections: Not on file  Intimate Partner Violence: Not At Risk (09/16/2023)   Humiliation, Afraid, Rape, and Kick questionnaire    Fear of Current or Ex-Partner: No    Emotionally Abused: No    Physically Abused: No    Sexually Abused: No    Review of Systems:    Constitutional: No weight loss, fever, chills, weakness or fatigue HEENT: Eyes: No change in vision               Ears, Nose, Throat:  No change in hearing or congestion Skin: No rash or itching Cardiovascular: No chest pain, chest pressure or palpitations   Respiratory: No SOB or cough Gastrointestinal: See HPI and otherwise negative Genitourinary: No dysuria or change in urinary frequency Neurological: No headache, dizziness or syncope Musculoskeletal: No new muscle or joint pain Hematologic: No bleeding or bruising Psychiatric: No history of depression or anxiety    Physical Exam:  Vital signs: BP 124/70   Pulse 84   Ht 5' 10 (1.778 m)   Wt 237 lb (107.5 kg)   BMI 34.01 kg/m   Constitutional: NAD, Well developed, Well nourished, alert and cooperative Head:  Normocephalic and atraumatic. Eyes:   PEERL, EOMI. No icterus. Conjunctiva pink. Respiratory: Respirations even and unlabored. Lungs clear to auscultation bilaterally.   No wheezes, crackles, or rhonchi.  Cardiovascular:  Regular rate and rhythm. No peripheral edema, cyanosis or pallor.  Gastrointestinal:  Soft, nondistended, nontender. No rebound or guarding. Normal bowel sounds. No appreciable masses or hepatomegaly. Rectal:  Not performed.  Msk:  Symmetrical without gross deformities. Without edema, no deformity or joint abnormality.  Neurologic:  Alert and  oriented x4;  grossly normal neurologically.  Skin:   Dry and intact without significant lesions or rashes. Psychiatric: Oriented to person, place and time. Demonstrates good judgement and reason without abnormal affect or behaviors.   RELEVANT LABS AND  IMAGING: CBC    Component Value Date/Time   WBC 6.2 03/29/2024 1542   RBC 5.28 03/29/2024 1542   HGB 15.8 03/29/2024 1542   HCT 46.8 03/29/2024 1542   PLT 243.0 03/29/2024 1542   MCV 88.8 03/29/2024 1542   MCH 17.9 (L) 09/19/2023 0907   MCHC 33.8 03/29/2024 1542   RDW 16.1 (H) 03/29/2024 1542   LYMPHSABS 1.6 03/29/2024 1542   MONOABS 0.5 03/29/2024 1542   EOSABS 0.1 03/29/2024 1542  BASOSABS 0.1 03/29/2024 1542    CMP     Component Value Date/Time   NA 141 03/29/2024 1542   K 4.1 03/29/2024 1542   CL 106 03/29/2024 1542   CO2 24 03/29/2024 1542   GLUCOSE 99 03/29/2024 1542   BUN 13 03/29/2024 1542   CREATININE 1.03 03/29/2024 1542   CALCIUM 8.9 03/29/2024 1542   PROT 7.5 03/29/2024 1542   ALBUMIN 4.1 03/29/2024 1542   AST 10 03/29/2024 1542   ALT 12 03/29/2024 1542   ALKPHOS 57 03/29/2024 1542   BILITOT 0.5 03/29/2024 1542   GFRNONAA >60 09/19/2023 9092     Assessment/Plan:   63 year old male history of S-rings and food impactions, iron  deficiency anemia, colonic tubular adenomas, recent acute submassive pulmonary embolism with pulmonary infarct April 2025 s/p catheter directed thrombolysis and initiation of Eliquis  x 6 months with echocardiogram showing RV systolic failure enlarged PA presenting for iron  deficiency   Iron  deficiency anemia B12 deficiency B12 414  - on B12 injections. 10/2023 Hgb 11.6, MCV 72.4, ferritin 9, iron  71 On iron  supplements daily Colonoscopy 2020 with 5 tubular adenomas and 1 tubulovillous adenoma (25 mm) with EGD March 2021 done for food impaction History of large hiatal hernia per recent imaging likely contributing to IDA but he does have significant polyp history. No GI symptoms currently, no overt bleeding.  Admission for massive PE 09/2023. Recently taken off home O2 with Eliquis  decreased -Check with pulmonology to see if Eliquis  can be held for EGD/colonoscopy to evaluate for iron  deficiency anemia - Will send to Norleen Schillings CRNA  and see if he is a candidate for the L[facility] with no longer needing O2 requirement - Repeat CBC, CMP, IBC/ferritin  Acute PE with acute cor pulmonale  Bilateral pulmonary emboli concerning for submassive pulmonary embolism with elevated troponin. admission April 2025 on Eliquis .  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 Benefits and risks of procedure explained including risks of bleeding, perforation, infection, missed lesions, reactions to medications and possible need for hospitalization and surgery for complications. Additional rare but real risk of stroke or other vascular clotting events off Eliquis  also explained and need to seek urgent help if any signs of these problems occur. Will communicate by phone or EMR with patient's  prescribing provider to confirm that holding Eliquis  is reasonable in this case.     History of colon polyps Colonoscopy in 2020 with multiple tubular adenomas (one TVA) and repeat due in 2023 - Repeat colonoscopy as long as pulmonology agrees with holding anticoagulation   History of esophageal stricture Hiatal hernia No dysphagia at this time.  S/p 4 EGDs for food impactions with most recent 2021 - Continue PPI twice daily - Asymptomatic, discussed with pulmonology holding Eliquis  and repeat EGD if able  Nestor Mollie DEVONNA [facility] 03/31/2024, 12:55 PM  Cc: Yolande Toribio MATSU, MD

## 2024-04-01 ENCOUNTER — Ambulatory Visit: Payer: Self-pay | Admitting: Gastroenterology

## 2024-04-02 NOTE — Telephone Encounter (Signed)
 Lm on vm - need to schedule a double with Pyrtle.  I will mail his instructions.

## 2024-04-04 ENCOUNTER — Other Ambulatory Visit (HOSPITAL_COMMUNITY): Payer: Self-pay

## 2024-04-04 NOTE — Telephone Encounter (Signed)
 Spoke to patient's wife - he was not home and she suggested I try back this afternoon

## 2024-04-09 NOTE — Telephone Encounter (Signed)
 Patient requesting f/u call in regards to previous note. Offered to schedule patient for next available but patient states he previously spoke with someone and scheduled for procedure so he is confused. Not seeing anything scheduled on my end. Patient requesting f/u call.   Thank you

## 2024-04-09 NOTE — Telephone Encounter (Signed)
 No answer

## 2024-04-09 NOTE — Telephone Encounter (Signed)
 Lm on vm to call back to schedule double with Dr. Albertus.  If I am not available he can schedule with one of the schedulers and I will mail his instructions

## 2024-04-12 NOTE — Telephone Encounter (Signed)
Lm on vm for patient to call back.

## 2024-04-16 ENCOUNTER — Telehealth: Payer: Self-pay | Admitting: Gastroenterology

## 2024-04-16 NOTE — Telephone Encounter (Signed)
 Patient has been scheduled for colon and egd on 12/22 at 2:30pm with Dr. Albertus, patient is needing prep medication and instructions. Please advise.

## 2024-04-16 NOTE — Telephone Encounter (Signed)
 Patient scheduled, will mail instructions

## 2024-04-17 NOTE — Telephone Encounter (Signed)
 Printed instructions for ECL stressing that patient should hold Eliquis  for 2 days prior to his procedure.  Mailed instructions

## 2024-05-28 ENCOUNTER — Telehealth: Payer: Self-pay | Admitting: Gastroenterology

## 2024-05-28 MED ORDER — NA SULFATE-K SULFATE-MG SULF 17.5-3.13-1.6 GM/177ML PO SOLN: ORAL | 0 refills | Status: DC

## 2024-05-28 NOTE — Telephone Encounter (Signed)
 Inbound call from patient requesting to have his prep medication sent over to the [facility] on [address]. please advise.

## 2024-05-28 NOTE — Telephone Encounter (Signed)
 Rx sent.

## 2024-06-03 ENCOUNTER — Encounter: Payer: Self-pay | Admitting: Internal Medicine

## 2024-06-03 ENCOUNTER — Ambulatory Visit: Admitting: Internal Medicine

## 2024-06-03 VITALS — BP 126/90 | HR 81 | Temp 97.5°F | Resp 17 | Ht 70.0 in | Wt 237.0 lb

## 2024-06-03 DIAGNOSIS — D123 Benign neoplasm of transverse colon: Secondary | ICD-10-CM

## 2024-06-03 DIAGNOSIS — K317 Polyp of stomach and duodenum: Secondary | ICD-10-CM

## 2024-06-03 DIAGNOSIS — Z860100 Personal history of colon polyps, unspecified: Secondary | ICD-10-CM

## 2024-06-03 DIAGNOSIS — Z1211 Encounter for screening for malignant neoplasm of colon: Secondary | ICD-10-CM

## 2024-06-03 DIAGNOSIS — K5730 Diverticulosis of large intestine without perforation or abscess without bleeding: Secondary | ICD-10-CM

## 2024-06-03 DIAGNOSIS — K648 Other hemorrhoids: Secondary | ICD-10-CM

## 2024-06-03 DIAGNOSIS — K222 Esophageal obstruction: Secondary | ICD-10-CM

## 2024-06-03 DIAGNOSIS — R1310 Dysphagia, unspecified: Secondary | ICD-10-CM

## 2024-06-03 DIAGNOSIS — D509 Iron deficiency anemia, unspecified: Secondary | ICD-10-CM

## 2024-06-03 DIAGNOSIS — K3189 Other diseases of stomach and duodenum: Secondary | ICD-10-CM

## 2024-06-03 DIAGNOSIS — D120 Benign neoplasm of cecum: Secondary | ICD-10-CM

## 2024-06-03 DIAGNOSIS — K2970 Gastritis, unspecified, without bleeding: Secondary | ICD-10-CM | POA: Diagnosis not present

## 2024-06-03 DIAGNOSIS — Z860101 Personal history of adenomatous and serrated colon polyps: Secondary | ICD-10-CM

## 2024-06-03 DIAGNOSIS — K319 Disease of stomach and duodenum, unspecified: Secondary | ICD-10-CM | POA: Diagnosis not present

## 2024-06-03 MED ORDER — SODIUM CHLORIDE 0.9 % IV SOLN: 500.0000 mL | Freq: Once | INTRAVENOUS | Status: DC

## 2024-06-03 NOTE — Progress Notes (Signed)
 Vss nad trans to pacu

## 2024-06-03 NOTE — Op Note (Signed)
 [facility] Patient Name: Jeffrey Potter Procedure Date: 06/03/2024 2:38 PM MRN: [id number] Endoscopist: Gordy CHRISTELLA Starch , MD, [id number] Age: 63 Referring MD:  Date of Birth: 1961-05-29 Gender: Male Account #: [id number] Procedure:                Upper GI endoscopy Indications:              Iron  deficiency anemia, B12 deficiency, Dysphagia                            and hx of food impaction Medicines:                Monitored Anesthesia Care Procedure:                Pre-Anesthesia Assessment:                           - Prior to the procedure, a History and Physical                            was performed, and patient medications and                            allergies were reviewed. The patient's tolerance of                            previous anesthesia was also reviewed. The risks                            and benefits of the procedure and the sedation                            options and risks were discussed with the patient.                            All questions were answered, and informed consent                            was obtained. Prior Anticoagulants: The patient has                            taken Eliquis  (apixaban ), last dose was 2 days                            prior to procedure. ASA Grade Assessment: III - A                            patient with severe systemic disease. After                            reviewing the risks and benefits, the patient was                            deemed in satisfactory condition to undergo the  procedure.                           After obtaining informed consent, the endoscope was                            passed under direct vision. Throughout the                            procedure, the patient's blood pressure, pulse, and                            oxygen saturations were monitored continuously. The                            GIF [id number] #[id number] was introduced through the                             mouth, and advanced to the second part of duodenum.                            The upper GI endoscopy was accomplished without                            difficulty. The patient tolerated the procedure                            well. Scope In: Scope Out: Findings:                 One benign-appearing, intrinsic moderate                            (circumferential scarring or stenosis; an endoscope                            may pass) stenosis was found 36 cm from the                            incisors. This stenosis measured 1.4 cm (inner                            diameter) x less than one cm (in length). The                            stenosis was traversed. A TTS dilator was passed                            through the scope. Dilation with a 15-16.5-18 mm                            balloon dilator was performed to 18 mm. The                            dilation site was examined  and showed mild                            improvement in luminal narrowing.                           A 5 cm hiatal hernia was present.                           Diffuse moderate inflammation characterized by                            congestion (edema), erythema and granularity was                            found in the entire examined stomach. Biopsies were                            taken with a cold forceps for Helicobacter pylori                            testing.                           Multiple 8 to 30 mm semi-pedunculated and                            pedunculated polyps were found in the cardia, in                            the gastric fundus and in the gastric body.                           The examined duodenum was normal. Biopsies for                            histology were taken with a cold forceps for                            evaluation of celiac disease. Complications:            No immediate complications. Estimated Blood Loss:     Estimated blood loss was  minimal. Impression:               - Benign-appearing esophageal stenosis. Dilated to                            18 mm with balloon.                           - 5 cm hiatal hernia.                           - Gastritis. Biopsied.                           - Multiple gastric  polyps, ranging from small to                            large (most consistent with inflamed hyperplastic                            polyps). This is the probable source of IDA in                            setting of DOAC.                           - Normal examined duodenum. Biopsied. Recommendation:           - Patient has a contact number available for                            emergencies. The signs and symptoms of potential                            delayed complications were discussed with the                            patient. Return to normal activities tomorrow.                            Written discharge instructions were provided to the                            patient.                           - Resume previous diet.                           - Continue present medications.                           - Await pathology results.                           - Referral to [facility] for gastric polypectomies given                            IDA and size of these inflamed polyps.                           - See the other procedure note for documentation of                            additional recommendations. Gordy CHRISTELLA Starch, MD 06/03/2024 3:20:21 PM This report has been signed electronically.

## 2024-06-03 NOTE — Progress Notes (Signed)
 "   GASTROENTEROLOGY PROCEDURE H&P NOTE   Primary Care Physician: Yolande Toribio MATSU, MD    Reason for Procedure:  Esophageal dysphagia, known esophageal stricture with prior food impactions, iron  deficiency anemia and B12 deficiency, history of multiple adenomatous polyps  Plan:    Upper and lower endoscopy, possible esophageal dilation  Patient is appropriate for endoscopic procedure(s) in the ambulatory ([facility]) setting.  The nature of the procedure, as well as the risks, benefits, and alternatives were carefully and thoroughly reviewed with the patient. Ample time for discussion and questions allowed.  All questions were answered. The patient understood, was satisfied, and agreed with the plan to proceed.    HPI:   Jeffrey Potter is a 63 y.o. male who presents for EGD and colonoscopy.  Medical history as below.  Tolerated the prep.  No recent chest pain or shortness of breath.  No abdominal pain today.  Past Medical History:  Diagnosis Date   Anemia    Gastric polyps    GERD (gastroesophageal reflux disease)    High cholesterol    High cholesterol    Hx of colonic polyps    Hypertension    NSVT (nonsustained ventricular tachycardia) (HCC)    Pulmonary embolism (HCC)     Past Surgical History:  Procedure Laterality Date   BALLOON DILATION N/A 10/10/2017   Procedure: BALLOON DILATION;  Surgeon: Albertus Gordy HERO, MD;  Location: [facility];  Service: Gastroenterology;  Laterality: N/A;   ESOPHAGOGASTRODUODENOSCOPY N/A 08/18/2017   Procedure: ESOPHAGOGASTRODUODENOSCOPY (EGD);  Surgeon: Albertus Gordy HERO, MD;  Location: [facility];  Service: Gastroenterology;  Laterality: N/A;   ESOPHAGOGASTRODUODENOSCOPY N/A 09/17/2017   Procedure: ESOPHAGOGASTRODUODENOSCOPY (EGD);  Surgeon: Rollin Dover, MD;  Location: [facility];  Service: Endoscopy;  Laterality: N/A;   ESOPHAGOGASTRODUODENOSCOPY N/A 10/08/2018   Procedure: ESOPHAGOGASTRODUODENOSCOPY (EGD);  Surgeon: Leigh Elspeth SQUIBB, MD;   Location: [facility];  Service: Gastroenterology;  Laterality: N/A;   ESOPHAGOGASTRODUODENOSCOPY N/A 09/08/2019   Procedure: ESOPHAGOGASTRODUODENOSCOPY (EGD);  Surgeon: Abran Norleen SAILOR, MD;  Location: [facility];  Service: Endoscopy;  Laterality: N/A;   ESOPHAGOGASTRODUODENOSCOPY (EGD) WITH PROPOFOL  N/A 10/10/2017   Procedure: ESOPHAGOGASTRODUODENOSCOPY (EGD) WITH PROPOFOL ;  Surgeon: Albertus Gordy HERO, MD;  Location: [facility];  Service: Gastroenterology;  Laterality: N/A;  with biopsy of gastric polyps   FOREIGN BODY REMOVAL  10/08/2018   Procedure: FOREIGN BODY REMOVAL;  Surgeon: Leigh Elspeth SQUIBB, MD;  Location: [facility];  Service: Gastroenterology;;  Food Impaction   FOREIGN BODY REMOVAL  09/08/2019   Procedure: FOREIGN BODY REMOVAL;  Surgeon: Abran Norleen SAILOR, MD;  Location: [facility];  Service: Endoscopy;;   IR ANGIOGRAM PULMONARY BILATERAL SELECTIVE  09/16/2023   IR INFUSION THROMBOL ARTERIAL INITIAL (MS)  09/16/2023   IR INFUSION THROMBOL ARTERIAL INITIAL (MS)  09/16/2023   IR THROMB F/U EVAL ART/VEN FINAL DAY (MS)  09/17/2023   IR US  GUIDE VASC ACCESS RIGHT  09/16/2023   IR US  GUIDE VASC ACCESS RIGHT  09/16/2023    Prior to Admission medications  Medication Sig Start Date End Date Taking? Authorizing Provider  amLODipine  (NORVASC ) 5 MG tablet Take 5 mg by mouth daily.   Yes [provider]  cyanocobalamin  1000 MCG tablet Take 1 tablet (1,000 mcg total) by mouth daily. 09/20/23  Yes Barbarann Nest, MD  Ferrous Sulfate  Dried (HIGH POTENCY IRON ) 65 MG TABS Take 1 tablet by mouth daily. 11/13/23  Yes [provider]  lovastatin (MEVACOR) 20 MG tablet Take 40 mg by mouth in the morning.  Yes [provider]  Omega-3 Fatty Acids (FISH OIL) 1000 MG CAPS Take 1 capsule by mouth daily. 09/03/19  Yes [provider]  omeprazole  (PRILOSEC OTC) 20 MG tablet Take 20 mg by mouth in the morning.   Yes [provider]  Potassium Chloride ER 20 MEQ TBCR Take 1  tablet by mouth daily. 07/06/23  Yes [provider]  apixaban  (ELIQUIS ) 2.5 MG TABS tablet Take 1 tablet (2.5 mg total) by mouth 2 (two) times daily. 03/26/24   Meade Verdon RAMAN, MD    Current Outpatient Medications  Medication Sig Dispense Refill   amLODipine  (NORVASC ) 5 MG tablet Take 5 mg by mouth daily.     cyanocobalamin  1000 MCG tablet Take 1 tablet (1,000 mcg total) by mouth daily. 30 tablet 0   Ferrous Sulfate  Dried (HIGH POTENCY IRON ) 65 MG TABS Take 1 tablet by mouth daily.     lovastatin (MEVACOR) 20 MG tablet Take 40 mg by mouth in the morning.     Omega-3 Fatty Acids (FISH OIL) 1000 MG CAPS Take 1 capsule by mouth daily.     omeprazole  (PRILOSEC OTC) 20 MG tablet Take 20 mg by mouth in the morning.     Potassium Chloride ER 20 MEQ TBCR Take 1 tablet by mouth daily.     apixaban  (ELIQUIS ) 2.5 MG TABS tablet Take 1 tablet (2.5 mg total) by mouth 2 (two) times daily. 180 tablet 3   Current Facility-Administered Medications  Medication Dose Route Frequency Provider Last Rate Last Admin   0.9 %  sodium chloride  infusion  500 mL Intravenous Once Nocole Zammit, Gordy HERO, MD        Allergies as of 06/03/2024   (No Known Allergies)    Family History  Problem Relation Age of Onset   Lung cancer Father        non smoker   Colon cancer Neg Hx    Esophageal cancer Neg Hx    Rectal cancer Neg Hx    Stomach cancer Neg Hx     Social History   Socioeconomic History   Marital status: Single    Spouse name: Not on file   Number of children: 0   Years of education: Not on file   Highest education level: Not on file  Occupational History   Not on file  Tobacco Use   Smoking status: Never   Smokeless tobacco: Never  Vaping Use   Vaping status: Never Used  Substance and Sexual Activity   Alcohol use: Yes    Comment: occasionally   Drug use: No   Sexual activity: Not on file  Other Topics Concern   Not on file  Social History Narrative   Not on file   Social Drivers of  Health   Tobacco Use: Low Risk (06/03/2024)   Patient History    Smoking Tobacco Use: Never    Smokeless Tobacco Use: Never    Passive Exposure: Not on file  Financial Resource Strain: Not on file  Food Insecurity: No Food Insecurity (09/16/2023)   Hunger Vital Sign    Worried About Running Out of Food in the Last Year: Never true    Ran Out of Food in the Last Year: Never true  Transportation Needs: No Transportation Needs (09/16/2023)   PRAPARE - Administrator, Civil Service (Medical): No    Lack of Transportation (Non-Medical): No  Physical Activity: Not on file  Stress: Not on file  Social Connections: Not on file  Intimate Partner Violence: Not At Risk (09/16/2023)   Humiliation, Afraid, Rape, and Kick questionnaire    Fear of Current or Ex-Partner: No    Emotionally Abused: No    Physically Abused: No    Sexually Abused: No  Depression (PHQ2-9): Not on file  Alcohol Screen: Not on file  Housing: Low Risk (09/16/2023)   Housing Stability Vital Sign    Unable to Pay for Housing in the Last Year: No    Number of Times Moved in the Last Year: 0    Homeless in the Last Year: No  Utilities: Not At Risk (09/16/2023)   AHC Utilities    Threatened with loss of utilities: No  Health Literacy: Not on file    Physical Exam: Vital signs in last 24 hours: @BP  (!) 176/109   Pulse 85   Temp (!) 97.5 F (36.4 C)   Ht 5' 10 (1.778 m)   Wt 237 lb (107.5 kg)   SpO2 97%   BMI 34.01 kg/m  GEN: NAD EYE: Sclerae anicteric ENT: MMM CV: Non-tachycardic Pulm: CTA b/l GI: Soft, NT/ND NEURO:  Alert & Oriented x 3   Gordy Starch, MD [facility]  06/03/2024 2:36 PM  "

## 2024-06-03 NOTE — Op Note (Signed)
 [facility] Patient Name: Jeffrey Potter Procedure Date: 06/03/2024 2:35 PM MRN: [id number] Endoscopist: Gordy CHRISTELLA Starch , MD, [id number] Age: 63 Referring MD:  Date of Birth: 10/18/1960 Gender: Male Account #: [id number] Procedure:                Colonoscopy Indications:              High risk colon cancer surveillance: Personal                            history of adenoma with villous component, Last                            colonoscopy: February 2020 (TA x 5, TVA x 1) Medicines:                Monitored Anesthesia Care Procedure:                Pre-Anesthesia Assessment:                           - Prior to the procedure, a History and Physical                            was performed, and patient medications and                            allergies were reviewed. The patient's tolerance of                            previous anesthesia was also reviewed. The risks                            and benefits of the procedure and the sedation                            options and risks were discussed with the patient.                            All questions were answered, and informed consent                            was obtained. Prior Anticoagulants: The patient has                            taken Eliquis  (apixaban ), last dose was 2 days                            prior to procedure. ASA Grade Assessment: III - A                            patient with severe systemic disease. After                            reviewing the risks and benefits, the patient was  deemed in satisfactory condition to undergo the                            procedure.                           After obtaining informed consent, the colonoscope                            was passed under direct vision. Throughout the                            procedure, the patient's blood pressure, pulse, and                            oxygen saturations were monitored continuously. The                             Olympus Scope [id number] was introduced through the                            anus and advanced to the cecum, identified by                            appendiceal orifice and ileocecal valve. The                            colonoscopy was performed without difficulty. The                            patient tolerated the procedure well. The quality                            of the bowel preparation was good. The ileocecal                            valve, appendiceal orifice, and rectum were                            photographed. Scope In: 2:58:40 PM Scope Out: 3:12:22 PM Scope Withdrawal Time: 0 hours 11 minutes 34 seconds  Total Procedure Duration: 0 hours 13 minutes 42 seconds  Findings:                 The digital rectal exam was normal.                           A 6 mm polyp was found in the cecum. The polyp was                            sessile. The polyp was removed with a cold snare.                            Resection and retrieval were complete.  Three sessile polyps were found in the transverse                            colon. The polyps were 4 to 5 mm in size. These                            polyps were removed with a cold snare. Resection                            and retrieval were complete.                           A few small-mouthed diverticula were found in the                            ascending colon.                           Internal hemorrhoids were found during                            retroflexion. The hemorrhoids were medium-sized. Complications:            No immediate complications. Estimated Blood Loss:     Estimated blood loss was minimal. Impression:               - One 6 mm polyp in the cecum, removed with a cold                            snare. Resected and retrieved.                           - Three 4 to 5 mm polyps in the transverse colon,                            removed with a cold  snare. Resected and retrieved.                           - Mild diverticulosis in the ascending colon.                           - Internal hemorrhoids. Recommendation:           - Patient has a contact number available for                            emergencies. The signs and symptoms of potential                            delayed complications were discussed with the                            patient. Return to normal activities tomorrow.  Written discharge instructions were provided to the                            patient.                           - Resume previous diet.                           - Continue present medications.                           - Resume Eliquis  (apixaban ) at prior dose today.                            Refer to managing physician for further adjustment                            of therapy.                           - Await pathology results.                           - Repeat colonoscopy is recommended for                            surveillance. The colonoscopy date will be                            determined after pathology results from today's                            exam become available for review.                           - See the other procedure note for documentation of                            additional recommendations. Gordy CHRISTELLA Starch, MD 06/03/2024 3:23:50 PM This report has been signed electronically.

## 2024-06-03 NOTE — Progress Notes (Signed)
 Called to room to assist during endoscopic procedure.  Patient ID and intended procedure confirmed with present staff. Received instructions for my participation in the procedure from the performing physician.

## 2024-06-03 NOTE — Patient Instructions (Addendum)
 Continue present medications. Await pathology results. Referral to [facility] for gastric polypectomies given IDA and size of these inflamed polyps.  Resume Eliquis  (apixaban ) at prior dose today. Refer to managing physician for further adjustment of therapy.  Please read over handouts provided  YOU HAD AN ENDOSCOPIC PROCEDURE TODAY AT THE [facility]:   Refer to the procedure report that was given to you for any specific questions about what was found during the examination.  If the procedure report does not answer your questions, please call your gastroenterologist to clarify.  If you requested that your care partner not be given the details of your procedure findings, then the procedure report has been included in a sealed envelope for you to review at your convenience later.  YOU SHOULD EXPECT: Some feelings of bloating in the abdomen. Passage of more gas than usual.  Walking can help get rid of the air that was put into your GI tract during the procedure and reduce the bloating. If you had a lower endoscopy (such as a colonoscopy or flexible sigmoidoscopy) you may notice spotting of blood in your stool or on the toilet paper. If you underwent a bowel prep for your procedure, you may not have a normal bowel movement for a few days.  Please Note:  You might notice some irritation and congestion in your nose or some drainage.  This is from the oxygen used during your procedure.  There is no need for concern and it should clear up in a day or so.  SYMPTOMS TO REPORT IMMEDIATELY:  Following lower endoscopy (colonoscopy or flexible sigmoidoscopy):  Excessive amounts of blood in the stool  Significant tenderness or worsening of abdominal pains  Swelling of the abdomen that is new, acute  Fever of 100F or higher  Following upper endoscopy (EGD)  Vomiting of blood or coffee ground material  New chest pain or pain under the shoulder blades  Painful or persistently difficult  swallowing  New shortness of breath  Fever of 100F or higher  Black, tarry-looking stools  For urgent or emergent issues, a gastroenterologist can be reached at any hour by calling [phone]. Do not use MyChart messaging for urgent concerns.    DIET:  We do recommend a small meal at first, but then you may proceed to your regular diet.  Drink plenty of fluids but you should avoid alcoholic beverages for 24 hours.  ACTIVITY:  You should plan to take it easy for the rest of today and you should NOT DRIVE or use heavy machinery until tomorrow (because of the sedation medicines used during the test).    FOLLOW UP: Our staff will call the number listed on your records the next business day following your procedure.  We will call around 7:15- 8:00 am to check on you and address any questions or concerns that you may have regarding the information given to you following your procedure. If we do not reach you, we will leave a message.     If any biopsies were taken you will be contacted by phone or by letter within the next 1-3 weeks.  Please call us  at [phone] if you have not heard about the biopsies in 3 weeks.    SIGNATURES/CONFIDENTIALITY: You and/or your care partner have signed paperwork which will be entered into your electronic medical record.  These signatures attest to the fact that that the information above on your After Visit Summary has been reviewed and is understood.  Full responsibility  of the confidentiality of this discharge information lies with you and/or your care-partner.

## 2024-06-03 NOTE — Progress Notes (Signed)
 Pt's states no medical or surgical changes since previsit or office visit.

## 2024-06-04 ENCOUNTER — Telehealth: Payer: Self-pay

## 2024-06-04 NOTE — Telephone Encounter (Signed)
 Follow up call to pt, no answer.

## 2024-06-10 LAB — SURGICAL PATHOLOGY

## 2024-06-17 ENCOUNTER — Ambulatory Visit: Payer: Self-pay | Admitting: Internal Medicine

## 2024-06-17 ENCOUNTER — Telehealth: Payer: Self-pay

## 2024-06-17 NOTE — Telephone Encounter (Signed)
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 I am okay with this patient being scheduled directly with our clinic unless he wants to discuss things further in clinic. Thanks. GM ----- Message ----- From: Albertus Gordy HERO, MD Sent: 06/11/2024   9:20 AM EST To: Odetta LITTIE Curly, RN; Aloha Finner Raddle., #  Yes and thank you He was agreeable when we discussed post-procedure I feel these explain his IDA. Thanks so much JMP ----- Message ----- From: Finner Aloha Raddle., MD Sent: 06/11/2024   6:16 AM EST To: Odetta LITTIE Curly, RN; Gordy HERO Albertus, MD  JMP, Reasonable to attempt these being removed.  As many as you have noted, I think, we would be tactful to try to remove as many as we can in a setting but he may need a couple of procedures to get majority of these taken care of. If you think he is up for this, then we can work on scheduling (will be beginning of February it seems). Let me know and then we can schedule if he agrees. Thanks. GM ----- Message ----- From: Albertus Gordy HERO, MD Sent: 06/10/2024  11:32 AM EST To: Odetta LITTIE Curly, RN; Aloha Finner Raddle., #  CAFFIE,  You take a look at this upper endoscopy.  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  After evaluation this is very likely the cause of his IDA. I think that the largest polyps need to be removed by an ATE in hospital setting Is this something you can help with?  If not let me know and I will refer him out. Thanks [employer]

## 2024-06-18 DIAGNOSIS — K317 Polyp of stomach and duodenum: Secondary | ICD-10-CM

## 2024-06-18 NOTE — Telephone Encounter (Signed)
 Patient returned call, requesting a call back. Please advise.

## 2024-06-18 NOTE — Telephone Encounter (Signed)
 Anti coag letter sent to Dr Meade.    Left message on machine to call back

## 2024-06-18 NOTE — Telephone Encounter (Signed)
 EGD EMR has been entered for 07/29/24 at [facility] with GM at 11 am

## 2024-06-19 NOTE — Telephone Encounter (Signed)
 EGD EMR  scheduled, pt instructed and medications reviewed.  Patient instructions mailed to home.  Patient to call with any questions or concerns.  Will await an anti coag response

## 2024-06-19 NOTE — Telephone Encounter (Signed)
 Left message on machine to call back

## 2024-06-27 ENCOUNTER — Telehealth: Payer: Self-pay | Admitting: Gastroenterology

## 2024-06-27 NOTE — Telephone Encounter (Signed)
 Inbound call from patient stating before scheduling hospital procedure he did not realize he had another appointment for his routine physical, patient would like to know if he can still do his routine physical that same day in the afternoon after procedure or does he need to reschedule. Requesting a call back  Please advise  Thank you

## 2024-06-27 NOTE — Telephone Encounter (Signed)
 The pt is going to call and reschedule his physical appt with PCP

## 2024-07-10 ENCOUNTER — Telehealth: Payer: Self-pay

## 2024-07-10 NOTE — Telephone Encounter (Signed)
 Due to schedule change pt moved to 07/25/24 at 1045 am at [facility]

## 2024-07-10 NOTE — Telephone Encounter (Signed)
 Left message on machine to call back

## 2024-07-11 NOTE — Telephone Encounter (Signed)
 The pt has been advised of the new date and time.  He has also been re-instructed.  He is calling his prescriber regarding Eliquis  hold in addtion to my letter that was sent. I have not gotten a response.  I will resend today

## 2024-07-11 NOTE — Telephone Encounter (Signed)
 Left message on machine to call back

## 2024-07-11 NOTE — Telephone Encounter (Signed)
 Patient returning call Requesting a call back  Please advise  Thank you

## 2024-07-12 ENCOUNTER — Encounter (HOSPITAL_COMMUNITY): Payer: Self-pay | Admitting: Gastroenterology

## 2024-07-12 NOTE — Progress Notes (Signed)
 Attempted to obtain medical history for pre op call via telephone, unable to reach at this time. HIPAA compliant voicemail message left requesting return call to pre surgical testing department.

## 2024-07-17 ENCOUNTER — Telehealth: Payer: Self-pay | Admitting: Gastroenterology

## 2024-07-17 NOTE — Telephone Encounter (Addendum)
 Procedure:Endoscopy Procedure date: 07/25/24 Procedure location: [facility] Arrival Time: 9:00 am Spoke with the patient Y/N: Yes Any prep concerns? No   Has the patient obtained the prep from the pharmacy ? No prep needed Do you have a care partner and transportation: Yes Any additional concerns? No

## 2024-07-17 NOTE — Telephone Encounter (Signed)
 SABRA

## 2024-07-17 NOTE — Telephone Encounter (Signed)
 The pt was contacted by phone and made aware to hold Eliquis  3 days prior to his procedure on 3/12.  The pt has been advised of the information and verbalized understanding.

## 2024-07-25 ENCOUNTER — Encounter (HOSPITAL_COMMUNITY): Admission: RE | Payer: Self-pay | Source: Home / Self Care

## 2024-07-25 ENCOUNTER — Ambulatory Visit (HOSPITAL_COMMUNITY): Admission: RE | Admit: 2024-07-25 | Source: Home / Self Care | Admitting: Gastroenterology
